# Patient Record
Sex: Female | Born: 1960
Health system: Southern US, Community
[De-identification: ages and names within clinical notes are randomized; demographics above are authoritative.]

## PROBLEM LIST (undated history)

## (undated) DIAGNOSIS — F329 Major depressive disorder, single episode, unspecified: Secondary | ICD-10-CM

## (undated) DIAGNOSIS — M659 Synovitis and tenosynovitis, unspecified: Secondary | ICD-10-CM

## (undated) DIAGNOSIS — F99 Mental disorder, not otherwise specified: Secondary | ICD-10-CM

## (undated) DIAGNOSIS — E785 Hyperlipidemia, unspecified: Secondary | ICD-10-CM

## (undated) DIAGNOSIS — F419 Anxiety disorder, unspecified: Secondary | ICD-10-CM

## (undated) DIAGNOSIS — G4730 Sleep apnea, unspecified: Secondary | ICD-10-CM

## (undated) DIAGNOSIS — K219 Gastro-esophageal reflux disease without esophagitis: Secondary | ICD-10-CM

## (undated) DIAGNOSIS — D649 Anemia, unspecified: Secondary | ICD-10-CM

## (undated) DIAGNOSIS — F32A Depression, unspecified: Secondary | ICD-10-CM

## (undated) DIAGNOSIS — I10 Essential (primary) hypertension: Secondary | ICD-10-CM

## (undated) DIAGNOSIS — M1990 Unspecified osteoarthritis, unspecified site: Secondary | ICD-10-CM

## (undated) DIAGNOSIS — C50919 Malignant neoplasm of unspecified site of unspecified female breast: Secondary | ICD-10-CM

## (undated) DIAGNOSIS — G709 Myoneural disorder, unspecified: Secondary | ICD-10-CM

## (undated) DIAGNOSIS — E119 Type 2 diabetes mellitus without complications: Secondary | ICD-10-CM

## (undated) DIAGNOSIS — M722 Plantar fascial fibromatosis: Secondary | ICD-10-CM

## (undated) HISTORY — DX: Unspecified osteoarthritis, unspecified site: M19.90

## (undated) HISTORY — PX: BREAST RECONSTRUCTION: SHX9

## (undated) HISTORY — DX: Synovitis and tenosynovitis, unspecified: M65.9

## (undated) HISTORY — DX: Anemia, unspecified: D64.9

## (undated) HISTORY — DX: Myoneural disorder, unspecified: G70.9

## (undated) HISTORY — DX: Major depressive disorder, single episode, unspecified: F32.9

## (undated) HISTORY — DX: Plantar fascial fibromatosis: M72.2

## (undated) HISTORY — DX: Sleep apnea, unspecified: G47.30

## (undated) HISTORY — DX: Mental disorder, not otherwise specified: F99

## (undated) HISTORY — DX: Anxiety disorder, unspecified: F41.9

## (undated) HISTORY — DX: Gastro-esophageal reflux disease without esophagitis: K21.9

## (undated) HISTORY — PX: BREAST LUMPECTOMY: SHX2

## (undated) HISTORY — PX: NECK SURGERY: SHX720

## (undated) HISTORY — DX: Type 2 diabetes mellitus without complications: E11.9

## (undated) HISTORY — DX: Depression, unspecified: F32.A

## (undated) HISTORY — PX: REDUCTION MAMMAPLASTY: SUR839

## (undated) HISTORY — PX: ABDOMINAL HYSTERECTOMY: SHX81

## (undated) HISTORY — PX: CHOLECYSTECTOMY: SHX55

## (undated) HISTORY — DX: Hyperlipidemia, unspecified: E78.5

## (undated) HISTORY — PX: KNEE SURGERY: SHX244

---

## 2013-08-24 ENCOUNTER — Encounter: Payer: Self-pay | Admitting: Podiatry

## 2013-08-24 ENCOUNTER — Ambulatory Visit (INDEPENDENT_AMBULATORY_CARE_PROVIDER_SITE_OTHER): Payer: BC Managed Care – PPO | Admitting: Podiatry

## 2013-08-24 VITALS — BP 151/74 | HR 85 | Ht 67.0 in | Wt 235.0 lb

## 2013-08-24 DIAGNOSIS — M2010 Hallux valgus (acquired), unspecified foot: Secondary | ICD-10-CM

## 2013-08-24 DIAGNOSIS — M79606 Pain in leg, unspecified: Secondary | ICD-10-CM

## 2013-08-24 DIAGNOSIS — M21969 Unspecified acquired deformity of unspecified lower leg: Secondary | ICD-10-CM

## 2013-08-24 DIAGNOSIS — M216X9 Other acquired deformities of unspecified foot: Secondary | ICD-10-CM

## 2013-08-24 DIAGNOSIS — M21619 Bunion of unspecified foot: Secondary | ICD-10-CM

## 2013-08-24 DIAGNOSIS — M79609 Pain in unspecified limb: Secondary | ICD-10-CM

## 2013-08-24 NOTE — Progress Notes (Signed)
Subjective: 53 year old female presents complaining of pain in both feet x 3 months. Pain is extremely unbearable, at the level of 10 out of 10 scale, 10 being the worse. This problem has been off and on for several years. She is on feet for entire 8 hours shift at work constantly. She was told that she has arthritis on her feet. Pain is in the morning when she is getting out of bed, after been sitting for a while, and after been on feet for a few hours.   Review of Systems - General ROS: negative for - chills, fatigue, fever, hot flashes, malaise, night sweats or sleep disturbance Ophthalmic ROS: negative ENT ROS: negative Allergy and Immunology ROS: Allergic to fresh cut pine apples. Breast ROS: negative for breast lumps Respiratory ROS: no cough, shortness of breath, or wheezing Cardiovascular ROS: no chest pain or dyspnea on exertion Gastrointestinal ROS: no abdominal pain, change in bowel habits, or black or bloody stools Genito-Urinary ROS: no dysuria, trouble voiding, or hematuria Musculoskeletal ROS: negative Neurological ROS: no TIA or stroke symptoms Dermatological ROS: negative.  Objective: Dermatologic: Normotrophic skin. No open lesions. Vascular: All pedal pulses are palpable bilateral. No edema or erythema noted. Neurologic: All epicritic and tactile sensations grossly intact. Orthopedic: Flat instep, dorsal displacement of the first ray bilateral, forefoot varus bilateral, enlarged bunion bilateral. Tight Achilles tendon with knee flexion and knee extension bilateral. Radiographic examination reveal HAV with bunion, short first ray, lowered medial longitudinal arch height with normal CYMA line bilateral  . 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 Forefoot varus bilateral. Symptomatic Pes Planus bilateral. Pain upon ambulation and weight bearing bilateral.  Plan: Reviewed findings and available options. Advised to stay  off of feet as much as possible. Continue to do exercise for weight contro, best will be stationary bike. Left foot ankle ankle placed in Ankle brace. May benefit from custom orthotics. Eventually may require First MCJ fusion or Cotton type procedure in conjunction with HAV correction and TAL.

## 2013-08-24 NOTE — Patient Instructions (Signed)
Seen for painful feet. Noted of weakened first Metatarsal bone with flattened arch. Ankle brace placed on left foot. Need Orthotics.

## 2013-08-29 ENCOUNTER — Encounter: Payer: Self-pay | Admitting: Podiatry

## 2013-08-29 ENCOUNTER — Ambulatory Visit (INDEPENDENT_AMBULATORY_CARE_PROVIDER_SITE_OTHER): Payer: BC Managed Care – PPO | Admitting: Podiatry

## 2013-08-29 VITALS — BP 142/79 | HR 74

## 2013-08-29 DIAGNOSIS — M21969 Unspecified acquired deformity of unspecified lower leg: Secondary | ICD-10-CM

## 2013-08-29 DIAGNOSIS — M21612 Bunion of left foot: Secondary | ICD-10-CM

## 2013-08-29 DIAGNOSIS — M21619 Bunion of unspecified foot: Secondary | ICD-10-CM

## 2013-08-29 DIAGNOSIS — M21962 Unspecified acquired deformity of left lower leg: Secondary | ICD-10-CM

## 2013-08-29 NOTE — Progress Notes (Signed)
Subjective: Maria Sims is helping but still having excruciating pain in left foot. Last week was able to rest more than usual and was able to get by better.  Need to have custom orthotics, but was not covered by her insurance. She already has a pair of OTC orthotics and are not comfortable.   Objective: Dermatologic: Normotrophic skin. No open lesions. Vascular: All pedal pulses are palpable bilateral. No edema or erythema noted.  Neurologic: All epicritic and tactile sensations grossly intact. Orthopedic: Flat instep, dorsal displacement of the first ray bilateral, forefoot varus bilateral, enlarged bunion bilateral.  Tight Achilles tendon with knee flexion and knee extension bilateral.  Radiographic findings reviewed again: HAV with bunion, short first ray, lowered medial longitudinal arch height with normal CYMA line bilateral.  Assessment: Ankle Equinus bilateral.  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  Forefoot varus bilateral.  Symptomatic Pes Planus bilateral.  Pain upon ambulation and weight bearing bilateral.   Plan: Reviewed findings and available options.  Patient request surgical options on left foot. Surgery consent form reviewed for Cotton osteotomy with bone graft and McBride bunionectomy left.  If condition does not improve with the above procedure, may require further intervention with Lapidus, STJ implant, TAL procedures.

## 2013-08-29 NOTE — Patient Instructions (Signed)
Surgery consent form reviewed for Cotton osteotomy with bone graft and McBride bunionectomy left foot. All questions answered. Please contact us should you have further questions regard to scheduled surgery.

## 2013-09-06 ENCOUNTER — Telehealth: Payer: Self-pay | Admitting: *Deleted

## 2013-09-06 NOTE — Telephone Encounter (Signed)
Dr. Caffie Pinto , Patient called today. She wanted me to tell you as soon as her Disability comes in effect... Possibly in December she wants to reschedule her surgery. She ask in the meantime is there anything you can give her to take the edge off of her pain.

## 2013-09-07 MED ORDER — NABUMETONE 500 MG PO TABS: 500.0000 mg | ORAL_TABLET | Freq: Two times a day (BID) | ORAL | Status: DC

## 2013-09-27 ENCOUNTER — Encounter: Payer: BC Managed Care – PPO | Admitting: Podiatry

## 2013-09-28 ENCOUNTER — Ambulatory Visit (INDEPENDENT_AMBULATORY_CARE_PROVIDER_SITE_OTHER): Payer: BC Managed Care – PPO | Admitting: Podiatry

## 2013-09-28 ENCOUNTER — Encounter: Payer: Self-pay | Admitting: Podiatry

## 2013-09-28 VITALS — BP 144/86 | HR 66

## 2013-09-28 DIAGNOSIS — M79609 Pain in unspecified limb: Secondary | ICD-10-CM

## 2013-09-28 DIAGNOSIS — M659 Synovitis and tenosynovitis, unspecified: Secondary | ICD-10-CM

## 2013-09-28 DIAGNOSIS — M21962 Unspecified acquired deformity of left lower leg: Secondary | ICD-10-CM

## 2013-09-28 DIAGNOSIS — M21969 Unspecified acquired deformity of unspecified lower leg: Secondary | ICD-10-CM

## 2013-09-28 DIAGNOSIS — M65979 Unspecified synovitis and tenosynovitis, unspecified ankle and foot: Secondary | ICD-10-CM

## 2013-09-28 DIAGNOSIS — M79605 Pain in left leg: Secondary | ICD-10-CM

## 2013-09-28 HISTORY — DX: Synovitis and tenosynovitis, unspecified: M65.9

## 2013-09-28 HISTORY — DX: Unspecified synovitis and tenosynovitis, unspecified ankle and foot: M65.979

## 2013-09-28 NOTE — Patient Instructions (Signed)
Left foot and ankle pain continues. Air CAM walker dispensed. Return as needed.

## 2013-09-28 NOTE — Progress Notes (Signed)
Subjective:  53 year old presents complaining of pain in left lower limb.  Wearing ankle brace, which is helping but still having excruciating pain in left foot and ankle after prolonged standing and walking.  Patient was not able to get orthotics. Patient wants to have surgical correction of the left foot but can not afford to be out of work at this time.  She is on her foot 8 full hours while at work.   Objective: No new changes in findings.  Dermatologic: Normotrophic skin. No open lesions. Vascular: All pedal pulses are palpable bilateral. No edema or erythema noted.  Neurologic: All epicritic and tactile sensations grossly intact. Orthopedic: Flat instep, dorsal displacement of the first ray bilateral, forefoot varus bilateral, enlarged bunion bilateral.  Tight Achilles tendon with knee flexion and knee extension bilateral.  Pain at left rearfoot and ankle joint.   Assessment: Ankle Equinus bilateral.  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  Forefoot varus bilateral.  Symptomatic Pes Planus bilateral.  Pain upon ambulation and weight bearing bilateral.   Plan: Reviewed findings and available options.  Left lower limb placed in Pneumatic CAM walker.

## 2013-11-01 ENCOUNTER — Other Ambulatory Visit: Payer: Self-pay | Admitting: Podiatry

## 2013-11-01 ENCOUNTER — Ambulatory Visit (INDEPENDENT_AMBULATORY_CARE_PROVIDER_SITE_OTHER): Payer: BC Managed Care – PPO | Admitting: Podiatry

## 2013-11-01 ENCOUNTER — Encounter: Payer: Self-pay | Admitting: Podiatry

## 2013-11-01 VITALS — BP 140/82 | HR 70

## 2013-11-01 DIAGNOSIS — M79602 Pain in left arm: Secondary | ICD-10-CM

## 2013-11-01 DIAGNOSIS — M79605 Pain in left leg: Secondary | ICD-10-CM

## 2013-11-01 NOTE — Patient Instructions (Signed)
Seen for pain in left foot. Continue to use ankle brace and CAM walker for now.

## 2013-11-01 NOTE — Progress Notes (Signed)
Subjective:  53 year old presents complaining of pain in left lower limb.  Wearing ankle brace at work and wears CAM walker when she is out of work.  Stated that her foot pain is too much to endure everyday and wants to have surgical intervention.   Objective:  No new changes in findings.  Dermatologic: Normotrophic skin. No open lesions. Vascular: All pedal pulses are palpable bilateral. No edema or erythema noted.  Neurologic: All epicritic and tactile sensations grossly intact. Orthopedic: Flat instep, dorsal displacement of the first ray bilateral, forefoot varus bilateral, enlarged bunion bilateral.  Tight Achilles tendon with knee flexion and knee extension bilateral.  Pain at left rearfoot and ankle joint.   Assessment: Ankle Equinus bilateral.  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  Forefoot varus bilateral.  Symptomatic Pes Planus bilateral.  Pain upon ambulation and weight bearing bilateral.   Plan: Reviewed findings and available options.  Continue with ankle brace and Pneumatic CAM walker as needed. Patient will postpone the surgery till her personal finances are in order.

## 2014-01-10 ENCOUNTER — Encounter: Payer: Self-pay | Admitting: Podiatry

## 2014-01-10 ENCOUNTER — Ambulatory Visit (INDEPENDENT_AMBULATORY_CARE_PROVIDER_SITE_OTHER): Payer: BC Managed Care – PPO | Admitting: Podiatry

## 2014-01-10 VITALS — BP 147/94 | HR 74

## 2014-01-10 DIAGNOSIS — M21612 Bunion of left foot: Secondary | ICD-10-CM

## 2014-01-10 DIAGNOSIS — M2012 Hallux valgus (acquired), left foot: Secondary | ICD-10-CM

## 2014-01-10 DIAGNOSIS — M21962 Unspecified acquired deformity of left lower leg: Secondary | ICD-10-CM

## 2014-01-10 NOTE — Patient Instructions (Signed)
Surgery consent form reviewed for Cotton osteotomy with bone graft and McBride bunionectomy left foot.

## 2014-01-10 NOTE — Progress Notes (Signed)
Subjective:  53 year old presents complaining of pain in left lower limb.  Stated that her foot pain is too much to endure everyday and wants to have surgical intervention.   Objective:  Dermatologic: Normotrophic skin. No open lesions. Vascular: All pedal pulses are palpable bilateral. No edema or erythema noted.  Neurologic: All epicritic and tactile sensations grossly intact. Orthopedic: Flat instep, dorsal displacement of the first ray bilateral, forefoot varus bilateral, enlarged bunion bilateral.  Tight Achilles tendon with knee flexion and knee extension bilateral.  Pain at left rearfoot and ankle joint.   Assessment: Ankle Equinus bilateral.  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  Forefoot varus bilateral.  Symptomatic Pes Planus bilateral.  Pain upon ambulation and weight bearing bilateral.   Plan: Reviewed findings and available options.  Continue with ankle brace and Pneumatic CAM walker as needed. Surgery consent form reviewed for Cotton osteotomy with bone graft and McBride bunionectomy left foot. Explained possible fusion of the first MCJ in future if pain continues after this bone graft procedure.

## 2014-01-26 DIAGNOSIS — M2010 Hallux valgus (acquired), unspecified foot: Secondary | ICD-10-CM

## 2014-01-26 DIAGNOSIS — M216X9 Other acquired deformities of unspecified foot: Secondary | ICD-10-CM

## 2014-01-26 HISTORY — PX: OTHER SURGICAL HISTORY: SHX169

## 2014-01-26 HISTORY — PX: BUNIONECTOMY: SHX129

## 2014-01-31 ENCOUNTER — Encounter: Payer: Self-pay | Admitting: Podiatry

## 2014-01-31 ENCOUNTER — Ambulatory Visit (INDEPENDENT_AMBULATORY_CARE_PROVIDER_SITE_OTHER): Payer: BLUE CROSS/BLUE SHIELD | Admitting: Podiatry

## 2014-01-31 VITALS — BP 140/90 | HR 85

## 2014-01-31 DIAGNOSIS — M21969 Unspecified acquired deformity of unspecified lower leg: Secondary | ICD-10-CM

## 2014-01-31 NOTE — Patient Instructions (Signed)
Doing well with cast following left foot surgery. Walking in cast without aid.  Use cane or crutches if needed for balance.  Return in 3 weeks.

## 2014-01-31 NOTE — Progress Notes (Signed)
1 week following left foot Cotton osteotomy with bone graft. Doing well with cast.  Denies any discomfort. Normal color in all digits. No sign of abnormal swelling or pain.  Continue with minimum ambulation and use crutches or cane as needed.

## 2014-02-06 ENCOUNTER — Telehealth: Payer: Self-pay | Admitting: *Deleted

## 2014-02-06 NOTE — Telephone Encounter (Signed)
02/04/2014 Dr. Caffie Pinto, Patient called on Sat and said she needs another RX for Oxycodone because she dropped most of it in the commode, she was opening bottle and it fell in the toilet. She said she has 5 left.

## 2014-02-13 ENCOUNTER — Telehealth: Payer: Self-pay | Admitting: *Deleted

## 2014-02-13 MED ORDER — OXYCODONE-ACETAMINOPHEN 7.5-325 MG PO TABS: 1.0000 | ORAL_TABLET | Freq: Four times a day (QID) | ORAL | Status: DC | PRN

## 2014-02-13 NOTE — Telephone Encounter (Signed)
02/13/2014  Dr. Caffie Pinto, Patient came in this afternoon and requested a rx for pain and discomfort.

## 2014-02-21 ENCOUNTER — Ambulatory Visit (INDEPENDENT_AMBULATORY_CARE_PROVIDER_SITE_OTHER): Payer: BLUE CROSS/BLUE SHIELD | Admitting: Podiatry

## 2014-02-21 ENCOUNTER — Encounter: Payer: Self-pay | Admitting: Podiatry

## 2014-02-21 VITALS — BP 135/87 | HR 73

## 2014-02-21 DIAGNOSIS — M79605 Pain in left leg: Secondary | ICD-10-CM

## 2014-02-21 DIAGNOSIS — M21962 Unspecified acquired deformity of left lower leg: Secondary | ICD-10-CM

## 2014-02-21 NOTE — Patient Instructions (Signed)
Seen for 4 weeks post op. Doing well. No abnormal findings. Cast replaced with Fiberglass below knee cast.  Continue with semi weight bearing. Return in 3 weeks.

## 2014-02-21 NOTE — Progress Notes (Signed)
4 weeks post op, left foot Cotton osteotomy with bone graft. Original cast is clean and well maintained other than worn out forefoot big toe area.  Stated that she experience occasional twinge of pain. No other problems. Post op X-rays taken, findings consistent with surgery performed.

## 2014-03-14 ENCOUNTER — Ambulatory Visit (INDEPENDENT_AMBULATORY_CARE_PROVIDER_SITE_OTHER): Payer: BLUE CROSS/BLUE SHIELD | Admitting: Podiatry

## 2014-03-14 ENCOUNTER — Encounter: Payer: Self-pay | Admitting: Podiatry

## 2014-03-14 DIAGNOSIS — M21962 Unspecified acquired deformity of left lower leg: Secondary | ICD-10-CM

## 2014-03-14 MED ORDER — OXYCODONE-ACETAMINOPHEN 7.5-325 MG PO TABS: 1.0000 | ORAL_TABLET | Freq: Four times a day (QID) | ORAL | Status: DC | PRN

## 2014-03-14 NOTE — Patient Instructions (Signed)
6 weeks post op wound doing well. Cast removed. Stay in CAM walker for ambulation. Return in 3 weeks.

## 2014-03-14 NOTE — Progress Notes (Signed)
6 weeks post op following Cotton osteotomy with bone graft left foot. Denies any discomfort other than needing a pain pill at night in order to sleep. Cast removed. Surgical wound well healed. Patient is to wear CAM walker to ambulate. She is able to walk in cast boot without pain or discomfort. Return in 3 weeks.

## 2014-03-23 ENCOUNTER — Telehealth: Payer: Self-pay | Admitting: *Deleted

## 2014-03-23 NOTE — Telephone Encounter (Signed)
Dr. Caffie Pinto, Maria Sims left a message today 03/23/14 and ask could you write a letter saying why she had to have this surgery, She need it to send to Disability([employer]) and she can pick it up on Monday.

## 2014-03-28 ENCOUNTER — Encounter: Payer: Self-pay | Admitting: Podiatry

## 2014-03-28 ENCOUNTER — Ambulatory Visit (INDEPENDENT_AMBULATORY_CARE_PROVIDER_SITE_OTHER): Payer: BLUE CROSS/BLUE SHIELD | Admitting: Podiatry

## 2014-03-28 DIAGNOSIS — Z9889 Other specified postprocedural states: Secondary | ICD-10-CM

## 2014-03-28 NOTE — Progress Notes (Signed)
Subjective: 8 weeks post op following Cotton osteotomy with bone graft left foot. Stated that she is still using cast boot.  She is able to walk in cast boot without pain or discomfort as long as she is not on her feet too long. Return in 3 weeks.

## 2014-03-28 NOTE — Patient Instructions (Signed)
Doing well wearing cast boot but cannot wear regular shoe due to swelling. Continue to try regular shoe in the morning with Compression bandage on. Return in 3 weeks.

## 2014-04-04 ENCOUNTER — Encounter: Payer: BLUE CROSS/BLUE SHIELD | Admitting: Podiatry

## 2014-04-12 ENCOUNTER — Ambulatory Visit (INDEPENDENT_AMBULATORY_CARE_PROVIDER_SITE_OTHER): Payer: BLUE CROSS/BLUE SHIELD | Admitting: Podiatry

## 2014-04-12 ENCOUNTER — Encounter: Payer: Self-pay | Admitting: Podiatry

## 2014-04-12 DIAGNOSIS — Z9889 Other specified postprocedural states: Secondary | ICD-10-CM

## 2014-04-12 NOTE — Progress Notes (Signed)
Subjective: This is 11 weeks post op left foot surgery.  Stated that she is still having pain and swelling on left lower limb after been up and around. The pain and swelling goes down once she sit and rest.  Stated that maximum she can stand on her feet is about 2 hours at the most before the foot swells and aches in ankle area.   Assessment: Post op pain and swelling on left lower limb with extended weight bearing.   Plan:  Continue with daily walking exercise.  Return in one month for evaluation.

## 2014-04-12 NOTE — Patient Instructions (Signed)
11 weeks post op left foot.  Still pain and swelling after been up and around.  Continue with daily walking exercise.

## 2014-04-18 ENCOUNTER — Encounter: Payer: BLUE CROSS/BLUE SHIELD | Admitting: Podiatry

## 2014-04-28 DIAGNOSIS — M79673 Pain in unspecified foot: Secondary | ICD-10-CM

## 2014-05-12 ENCOUNTER — Encounter: Payer: Self-pay | Admitting: Podiatry

## 2014-05-12 ENCOUNTER — Ambulatory Visit (INDEPENDENT_AMBULATORY_CARE_PROVIDER_SITE_OTHER): Payer: BLUE CROSS/BLUE SHIELD | Admitting: Podiatry

## 2014-05-12 VITALS — BP 153/89 | HR 80

## 2014-05-12 DIAGNOSIS — Z9889 Other specified postprocedural states: Secondary | ICD-10-CM

## 2014-05-12 NOTE — Progress Notes (Signed)
Subjective: This is 15 weeks post op on left foot surgery. She is walking in regular shoes. Stated that last night her left ankle got swollen. Also has occasional sharp pain over bunion surgery site and sometimes at mid foot area.  Usually able to walk with compression stockinet that helps with swelling.   Objective: Positive of mild foot and ankle edema left. No other changes noted.  Able to ambulate short period without pain.  Assessment: Post op pain and swelling on left foot not affecting light ambulation.  Plan: Continue with compression stockinet.  May benefit from ankle support if indicated. Continue increase ambulation as tolerated.  Return as needed.

## 2014-05-12 NOTE — Patient Instructions (Addendum)
Still having occasional pain with swelling in left foot and ankle.  Continue with compression stockinet and use ankle support as needed.  May benefit from custom/over the counter orthotics. Return as needed.

## 2014-05-29 DIAGNOSIS — G8929 Other chronic pain: Secondary | ICD-10-CM | POA: Insufficient documentation

## 2014-05-29 DIAGNOSIS — M542 Cervicalgia: Secondary | ICD-10-CM

## 2014-06-08 DIAGNOSIS — F331 Major depressive disorder, recurrent, moderate: Secondary | ICD-10-CM | POA: Insufficient documentation

## 2014-08-08 ENCOUNTER — Ambulatory Visit (INDEPENDENT_AMBULATORY_CARE_PROVIDER_SITE_OTHER): Payer: BLUE CROSS/BLUE SHIELD | Admitting: Podiatry

## 2014-08-08 ENCOUNTER — Encounter: Payer: Self-pay | Admitting: Podiatry

## 2014-08-08 DIAGNOSIS — M21962 Unspecified acquired deformity of left lower leg: Secondary | ICD-10-CM

## 2014-08-08 MED ORDER — OXYCODONE-ACETAMINOPHEN 7.5-325 MG PO TABS: 1.0000 | ORAL_TABLET | Freq: Four times a day (QID) | ORAL | Status: DC | PRN

## 2014-08-08 NOTE — Patient Instructions (Signed)
Seen for follow up on left foot pain. Now suffering from Sciatica as well. Ambulate and increase activity as tolerated.

## 2014-08-08 NOTE — Progress Notes (Signed)
Subjective: Stated that her foot swells off and on. Also just found out she has sciatic nerve problem. Pain comes from back down to the toes.  Left foot pain is on medial aspect of the bunion site. From the surgical site is minimum.  While she is out she has to use cane to maintain her balance.  Been on feet more than usual for the past few weeks chasing after her 53 year old grand daughter at home.   Patient filed disability paper at [facility] department.  Patient is having difficulty walking up or down stairs. It increases pain on her knees.  Pain in right foot in the morning as she getting out of bed. Pain ease off after been on for a while.  Objective: Enlarged scar over left bunion site with pain. Lower limb edema with prolonged standing left foot and leg. Nerve pain starting from lower back down to foot toe area.   Assessment: Lower limb arthritis. Sciatica left side. Pain and swelling on left lower limb with prolonged standing or ambulation.   Plan: Reviewed findings. Continue current level of care.  Stay off of feet as needed. Ambulate as tolerated. Return as needed.

## 2014-08-18 DIAGNOSIS — M79673 Pain in unspecified foot: Secondary | ICD-10-CM

## 2014-09-04 ENCOUNTER — Encounter (HOSPITAL_BASED_OUTPATIENT_CLINIC_OR_DEPARTMENT_OTHER): Payer: Self-pay

## 2014-09-04 ENCOUNTER — Emergency Department (HOSPITAL_BASED_OUTPATIENT_CLINIC_OR_DEPARTMENT_OTHER)
Admission: EM | Admit: 2014-09-04 | Discharge: 2014-09-04 | Disposition: A | Discharge status: Home / Self Care | Payer: Self-pay | Attending: Emergency Medicine | Admitting: Emergency Medicine

## 2014-09-04 DIAGNOSIS — G8929 Other chronic pain: Secondary | ICD-10-CM | POA: Insufficient documentation

## 2014-09-04 DIAGNOSIS — M542 Cervicalgia: Secondary | ICD-10-CM | POA: Insufficient documentation

## 2014-09-04 DIAGNOSIS — Z853 Personal history of malignant neoplasm of breast: Secondary | ICD-10-CM | POA: Insufficient documentation

## 2014-09-04 DIAGNOSIS — M549 Dorsalgia, unspecified: Secondary | ICD-10-CM | POA: Insufficient documentation

## 2014-09-04 DIAGNOSIS — Z79899 Other long term (current) drug therapy: Secondary | ICD-10-CM | POA: Insufficient documentation

## 2014-09-04 DIAGNOSIS — Z791 Long term (current) use of non-steroidal anti-inflammatories (NSAID): Secondary | ICD-10-CM | POA: Insufficient documentation

## 2014-09-04 DIAGNOSIS — M25562 Pain in left knee: Secondary | ICD-10-CM | POA: Insufficient documentation

## 2014-09-04 DIAGNOSIS — I10 Essential (primary) hypertension: Secondary | ICD-10-CM | POA: Insufficient documentation

## 2014-09-04 HISTORY — DX: Essential (primary) hypertension: I10

## 2014-09-04 HISTORY — DX: Malignant neoplasm of unspecified site of unspecified female breast: C50.919

## 2014-09-04 MED ORDER — PREDNISONE 20 MG PO TABS: 40.0000 mg | ORAL_TABLET | Freq: Every day | ORAL | Status: DC

## 2014-09-04 NOTE — ED Provider Notes (Signed)
CSN: [id number]     Arrival date & time 09/04/14  1412 History  This chart was scribed for Maria Belling, MD by Helane Gunther, ED Scribe. This patient was seen in room MH09/MH09 and the patient's care was started at 3:15 PM.    Chief Complaint  Patient presents with  . Neck Pain   The history is provided by the patient. No language interpreter was used.   HPI Comments: Windy Dudek is a 54 y.o. female with a PMHx of HTN and a PSHx of neck surgery (2x herniated disk repair) who presents to the Emergency Department complaining of constant posterior neck pain radiating to the shoulders and arms, all the way down to the hands, onset a few months ago. She also c/o bilateral knee pain, onset at the same time. 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 She reports associated numbness to the hands. She has been diagnosed with sciatica on the left side. She has been taking oxycodone and ibuprofen with mild relief. She is on BP medication, and was on gabapentin for a time. She denies having similar pains before starting this work. She has since lost her job and with it her insurance.  Past Medical History  Diagnosis Date  . Tenosynovitis of foot and ankle 09/28/2013  . Hypertension   . Breast cancer    Past Surgical History  Procedure Laterality Date  . Bunionectomy Left 01/26/2014    @ [facility]  . Cotton osteotomy w/ bone graft Left 01/26/2014    @ [place]  . Neck surgery    . Cholecystectomy    . Breast lumpectomy    . Breast reconstruction    . Knee surgery    . Abdominal hysterectomy     No family history on file. Social History  Substance Use Topics  . Smoking status: Never Smoker   . Smokeless tobacco: Never Used  . Alcohol Use: No   OB History    No data available     Review of Systems  Musculoskeletal: Positive for myalgias, back pain, arthralgias and neck pain.  Neurological:  Positive for numbness.  All other systems reviewed and are negative.   Allergies  Pineapple  Home Medications   Prior to Admission medications   Medication Sig Start Date End Date Taking? Authorizing Provider  gabapentin (NEURONTIN) 100 MG capsule Take 100 mg by mouth daily.    Historical Provider, MD  HYDROcodone-acetaminophen (NORCO/VICODIN) 5-325 MG per tablet Take 1 tablet by mouth every 6 (six) hours as needed for moderate pain.    Historical Provider, MD  meloxicam (MOBIC) 15 MG tablet Take 15 mg by mouth daily.    Historical Provider, MD  methocarbamol (ROBAXIN) 500 MG tablet Take 500 mg by mouth daily.    Historical Provider, MD  nabumetone (RELAFEN) 500 MG tablet Take 1 tablet (500 mg total) by mouth 2 (two) times daily. 09/07/13   Myeong O Sheard, DPM  oxyCODONE-acetaminophen (PERCOCET) 7.5-325 MG per tablet Take 1 tablet by mouth every 6 (six) hours as needed. 08/08/14   Myeong O Sheard, DPM  predniSONE (DELTASONE) 20 MG tablet Take 2 tablets (40 mg total) by mouth daily. 09/04/14   Maria Belling, MD  valsartan-hydrochlorothiazide (DIOVAN-HCT) 80-12.5 MG per tablet Take 1 tablet by mouth daily.  06/02/13   Historical Provider, MD  ZOLPIDEM TARTRATE PO Take 5 mg by mouth as needed.    Historical  Provider, MD   BP 140/90 mmHg  Pulse 74  Temp(Src) 98.4 F (36.9 C) (Oral)  Resp 18  Ht 5\' 7"  (1.702 m)  Wt 230 lb (104.327 kg)  BMI 36.01 kg/m2  SpO2 99% Physical Exam  Constitutional: She appears well-developed and well-nourished.  HENT:  Head: Normocephalic and atraumatic.  Eyes: Conjunctivae are normal. Right eye exhibits no discharge. Left eye exhibits no discharge.  Pulmonary/Chest: Effort normal. No respiratory distress.  Musculoskeletal: She exhibits tenderness.  Midline cervical TTP. Good grip strength bilaterally. Somewhat painful gait, more so in the L knee. No localized numbness or weakness.  Neurological: She is alert. Coordination normal.  Skin: Skin is warm and  dry. No rash noted. She is not diaphoretic. No erythema.  Psychiatric: She has a normal mood and affect.  Nursing note and vitals reviewed.   ED Course  Procedures  DIAGNOSTIC STUDIES: Oxygen Saturation is 99% on RA, normal by my interpretation.    COORDINATION OF CARE: 3:28 PM - Discussed plans to refer to an orthopedist or sports medicine specialist, and ordering a 4-day course of steroids. Pt advised of plan for treatment and pt agrees.  Labs Review Labs Reviewed - No data to display  Imaging Review No results found. I have personally reviewed and evaluated these images and lab results as part of my medical decision-making.   EKG Interpretation None      MDM   Final diagnoses:  Chronic neck and back pain    Patient with chronic neck and back pain. No red flags. Will have patient follow-up as needed with sports medicine. Given short course of steroids    I personally performed the services described in this documentation, which was scribed in my presence. The recorded information has been reviewed and is accurate.    Maria Belling, MD 09/04/14 [phone]

## 2014-09-04 NOTE — ED Notes (Signed)
C/o posterior neck pain "that travels down to my legs and feet for months"-denies injury-pt NAD with steady gait

## 2014-09-04 NOTE — Discharge Instructions (Signed)

## 2014-09-06 ENCOUNTER — Ambulatory Visit: Payer: Self-pay | Admitting: Family Medicine

## 2014-09-11 ENCOUNTER — Encounter: Payer: Self-pay | Admitting: Family Medicine

## 2014-09-11 ENCOUNTER — Ambulatory Visit (INDEPENDENT_AMBULATORY_CARE_PROVIDER_SITE_OTHER): Payer: Self-pay | Admitting: Family Medicine

## 2014-09-11 VITALS — BP 141/91 | HR 72 | Ht 67.0 in | Wt 230.0 lb

## 2014-09-11 DIAGNOSIS — M25562 Pain in left knee: Secondary | ICD-10-CM

## 2014-09-11 DIAGNOSIS — M25561 Pain in right knee: Secondary | ICD-10-CM

## 2014-09-11 MED ORDER — DICLOFENAC SODIUM 75 MG PO TBEC: 75.0000 mg | DELAYED_RELEASE_TABLET | Freq: Two times a day (BID) | ORAL | Status: DC

## 2014-09-11 NOTE — Patient Instructions (Signed)
Take tylenol 500mg  1-2 tabs three times a day for pain. Try diclofenac twice a day with food INSTEAD of the nabumetone for at least 2-3 weeks and see if you feel any better with this one. Glucosamine sulfate 750mg  twice a day is a supplement that may help. Capsaicin, aspercreme, or biofreeze topically up to four times a day may also help with pain. Cortisone injections are an option - call if you want to try one of these. If cortisone injections do not help, there are different types of shots that may help but they take longer to take effect. It's important that you continue to stay active. Straight leg raises, knee extensions 3 sets of 10 once a day (add ankle weight if these become too easy). Consider physical therapy to strengthen muscles around the joint that hurts to take pressure off of the joint itself. Shoe inserts with good arch support may be helpful. Walker or cane if needed. Heat or ice 15 minutes at a time 3-4 times a day as needed to help with pain. Water aerobics and cycling with low resistance are the best two types of exercise for arthritis. Call me when the cone coverage goes through Otherwise follow up with me in 1 month to 6 weeks.

## 2014-09-13 DIAGNOSIS — M25561 Pain in right knee: Secondary | ICD-10-CM | POA: Insufficient documentation

## 2014-09-13 DIAGNOSIS — M25562 Pain in left knee: Secondary | ICD-10-CM

## 2014-09-13 NOTE — Progress Notes (Signed)
PCP: No primary care provider on file.  Subjective:   HPI: Patient is a 54 y.o. female here for bilateral knee pain.  Patient reports she's had 3 years of bilateral knee pain. Started mainly after working on concrete floors standing for 7 1/2 hours a day 7 days a week. No acute injury or trauma. Difficulty bending, kneeling, getting up after prolonged sitting. Difficulty walking for exercise. Some catching. Remote history of partial meniscectomy of right knee. No locking, giving out.  Past Medical History  Diagnosis Date  . Tenosynovitis of foot and ankle 09/28/2013  . Hypertension   . Breast cancer     Current Outpatient Prescriptions on File Prior to Visit  Medication Sig Dispense Refill  . gabapentin (NEURONTIN) 100 MG capsule Take 100 mg by mouth daily.    . valsartan-hydrochlorothiazide (DIOVAN-HCT) 80-12.5 MG per tablet Take 1 tablet by mouth daily.      No current facility-administered medications on file prior to visit.    Past Surgical History  Procedure Laterality Date  . Bunionectomy Left 01/26/2014    @ [facility]  . Cotton osteotomy w/ bone graft Left 01/26/2014    @ [place]  . Neck surgery    . Cholecystectomy    . Breast lumpectomy    . Breast reconstruction    . Knee surgery    . Abdominal hysterectomy      Allergies  Allergen Reactions  . Pineapple     Social History   Social History  . Marital Status: Single    Spouse Name: N/A  . Number of Children: N/A  . Years of Education: N/A   Occupational History  . Not on file.   Social History Main Topics  . Smoking status: Never Smoker   . Smokeless tobacco: Never Used  . Alcohol Use: No  . Drug Use: No  . Sexual Activity: Not on file   Other Topics Concern  . Not on file   Social History Narrative    No family history on file.  BP 141/91 mmHg  Pulse 72  Ht 5\' 7"  (1.702 m)  Wt 230 lb (104.327 kg)  BMI 36.01 kg/m2  Review of Systems: See HPI above.    Objective:   Physical Exam:  Gen: NAD  Bilateral knees: No gross deformity, ecchymoses, effusion. TTP mainly medial joint lines. FROM. Negative ant/post drawers. Negative valgus/varus testing. Negative lachmanns. Negative mcmurrays, apleys, patellar apprehension. NV intact distally.    Assessment & Plan:  1. Bilateral knee pain - consistent with DJD.  Try diclofenac twice a day with food.  Discussed tyelnol, glucosamine, topical medications.  Given information to get cone coverage and would consider radiographs.  She will consider injections.  Shown home exercises to do daily.  Heat/ice.  F/u in 1 month to 6 weeks.

## 2014-09-13 NOTE — Assessment & Plan Note (Signed)
consistent with DJD.  Try diclofenac twice a day with food.  Discussed tyelnol, glucosamine, topical medications.  Given information to get cone coverage and would consider radiographs.  She will consider injections.  Shown home exercises to do daily.  Heat/ice.  F/u in 1 month to 6 weeks.

## 2014-09-14 DIAGNOSIS — M533 Sacrococcygeal disorders, not elsewhere classified: Secondary | ICD-10-CM | POA: Insufficient documentation

## 2014-09-22 ENCOUNTER — Ambulatory Visit: Payer: Self-pay

## 2014-09-27 ENCOUNTER — Ambulatory Visit: Payer: Self-pay

## 2014-10-10 ENCOUNTER — Encounter: Payer: Self-pay | Admitting: Internal Medicine

## 2014-10-10 ENCOUNTER — Ambulatory Visit (INDEPENDENT_AMBULATORY_CARE_PROVIDER_SITE_OTHER): Payer: No Typology Code available for payment source | Admitting: Internal Medicine

## 2014-10-10 VITALS — BP 145/98 | HR 70 | Temp 98.4°F | Ht 67.0 in | Wt 277.8 lb

## 2014-10-10 DIAGNOSIS — Z1211 Encounter for screening for malignant neoplasm of colon: Secondary | ICD-10-CM | POA: Insufficient documentation

## 2014-10-10 DIAGNOSIS — R7303 Prediabetes: Secondary | ICD-10-CM | POA: Insufficient documentation

## 2014-10-10 DIAGNOSIS — Z0000 Encounter for general adult medical examination without abnormal findings: Secondary | ICD-10-CM

## 2014-10-10 DIAGNOSIS — I10 Essential (primary) hypertension: Secondary | ICD-10-CM

## 2014-10-10 DIAGNOSIS — R7309 Other abnormal glucose: Secondary | ICD-10-CM

## 2014-10-10 DIAGNOSIS — F331 Major depressive disorder, recurrent, moderate: Secondary | ICD-10-CM

## 2014-10-10 LAB — GLUCOSE, CAPILLARY: GLUCOSE-CAPILLARY: 100 mg/dL — AB (ref 65–99)

## 2014-10-10 LAB — POCT GLYCOSYLATED HEMOGLOBIN (HGB A1C): HEMOGLOBIN A1C: 6.4

## 2014-10-10 MED ORDER — ZOLPIDEM TARTRATE 5 MG PO TABS: 5.0000 mg | ORAL_TABLET | Freq: Every evening | ORAL | Status: DC | PRN

## 2014-10-10 MED ORDER — ESCITALOPRAM OXALATE 20 MG PO TABS: 20.0000 mg | ORAL_TABLET | Freq: Every day | ORAL | Status: DC

## 2014-10-10 NOTE — Assessment & Plan Note (Signed)
BP Readings from Last 3 Encounters:  10/10/14 145/98  09/11/14 141/91  09/04/14 140/90    No results found for: NA, K, CREATININE  Assessment: Blood pressure control:  Just above goal Progress toward BP goal:   Just above goal Comments: compliant with valsartan-HCTZ 80-12.5 mg daily.   Plan: Medications:  continue current medications, for now.  Educational resources provided:   Self management tools provided:   Other plans: Once patient finishes prescription, I would favor switching to Lisinopril-HCTZ 20-12.5 mg daily from [facility].

## 2014-10-10 NOTE — Assessment & Plan Note (Signed)
HgbA1c 6.4%. Patient counseled on diet and exercise. Will need to recheck HgbA1c at follow up visit.

## 2014-10-10 NOTE — Progress Notes (Signed)
Subjective:    Patient ID: Maria Sims, female    DOB: 1960-06-13, 54 y.o.   MRN: [id number]  HPI [name] is a 54 y.o. female with PMHx of HTN and  who presents to the clinic for establishing care and follow up on HTN. Please see A&P for the status of the patient's chronic medical problems.   Past Medical History  Diagnosis Date  . Tenosynovitis of foot and ankle 09/28/2013  . Hypertension   . Breast cancer     Outpatient Encounter Prescriptions as of 10/10/2014  Medication Sig Note  . escitalopram (LEXAPRO) 20 MG tablet Take 1 tablet (20 mg total) by mouth daily.   . valsartan-hydrochlorothiazide (DIOVAN-HCT) 80-12.5 MG per tablet Take 1 tablet by mouth daily.  08/29/2013: Received from: External Pharmacy  . zolpidem (AMBIEN) 5 MG tablet Take 1 tablet (5 mg total) by mouth at bedtime as needed for sleep.   . [DISCONTINUED] diclofenac (VOLTAREN) 75 MG EC tablet Take 1 tablet (75 mg total) by mouth 2 (two) times daily.   . [DISCONTINUED] escitalopram (LEXAPRO) 20 MG tablet Take 20 mg by mouth. 09/13/2014: Received from: [facility]  . [DISCONTINUED] gabapentin (NEURONTIN) 100 MG capsule Take 100 mg by mouth daily.   . [DISCONTINUED] predniSONE (STERAPRED UNI-PAK 21 TAB) 10 MG (21) TBPK tablet TAPER DAILY 09/13/2014: Received from: External Pharmacy  . [DISCONTINUED] zolpidem (AMBIEN) 5 MG tablet Take 5 mg by mouth. 09/13/2014: Received from: [facility]   No facility-administered encounter medications on file as of 10/10/2014.    No family history on file.  Social History   Social History  . Marital Status: Single    Spouse Name: N/A  . Number of Children: N/A  . Years of Education: N/A   Occupational History  . Not on file.   Social History Main Topics  . Smoking status: Never Smoker   . Smokeless tobacco: Never Used  . Alcohol Use: No  . Drug Use: No  . Sexual Activity: Not on file   Other Topics Concern  . Not on file   Social History Narrative    Review of Systems General: Denies fever, chills, fatigue Respiratory: Denies SOB, cough, DOE  Cardiovascular: Denies chest pain and palpitations.  Gastrointestinal: Denies abdominal pain, diarrhea, constipation, blood in stool  Endocrine: Denies hot or cold intolerance, polyuria, and polydipsia. Musculoskeletal: Admits to chronic back pain, chronic knee pain, and chronic pain in left hand with associated numbness and tingling. Denies myalgias,joint swelling, and gait problem.  Skin: Denies pallor, rash and wounds.  Neurological: Denies dizziness, headaches, weakness, lightheadedness     Objective:   Physical Exam Filed Vitals:   10/10/14 1029 10/10/14 1111  BP: 155/82 145/98  Pulse: 76 70  Temp: 98.4 F (36.9 C)   TempSrc: Oral   Height: 5\' 7"  (1.702 m)   Weight: 277 lb 12.8 oz (126.009 kg)   SpO2: 99%    General: Vital signs reviewed.  Patient is well-developed and well-nourished, in no acute distress and cooperative with exam.  Head: Normocephalic and atraumatic. Eyes: EOMI, conjunctivae normal, no scleral icterus.  Neck: Supple, trachea midline, no JVD, masses, thyromegaly, or carotid bruit present.  Cardiovascular: RRR, S1 normal, S2 normal, no murmurs, gallops, or rubs. Pulmonary/Chest: Clear to auscultation bilaterally, no wheezes, rales, or rhonchi. Abdominal: Soft, non-tender, non-distended, BS + Extremities: No lower extremity edema bilaterally, pulses symmetric and intact bilaterally. Skin: Warm, dry and intact. No rashes or erythema. Psychiatric: Normal mood and affect. speech  and behavior is normal. Cognition and memory are normal.     Assessment & Plan:   Please see problem oriented assessment and plan.

## 2014-10-10 NOTE — Assessment & Plan Note (Addendum)
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 She cannot remember her last mammogram. Will place order for mammogram.  Colonoscopy: Patient is agreeable. Once orange card is established, we will refer to GI for colonoscopy. She is asymptomatic and no family hx of colon cancer. Pap Smear: Per patient, had one last year and normal. She gets yearly pap smears with her OB/GYN Flu Shot: Refused.  We will check routine labs today: CBC, BMET, Lipid Panel, HgbA1c, and TSH.

## 2014-10-10 NOTE — Patient Instructions (Addendum)
Continue taking your medications as prescribed.   We will schedule a mammogram for you.  We will call you with any abnormal lab results.   Follow up in 6 months.

## 2014-10-10 NOTE — Assessment & Plan Note (Signed)
Patient states she is well controlled on Lexapro 20 mg daily. We will continue this for now. If we plan on switching to [facility] outpatient pharmacy, we would need to do a cross taper and start a new antidepressant.

## 2014-10-11 ENCOUNTER — Telehealth: Payer: Self-pay | Admitting: Internal Medicine

## 2014-10-11 ENCOUNTER — Other Ambulatory Visit: Payer: Self-pay | Admitting: Internal Medicine

## 2014-10-11 DIAGNOSIS — E7800 Pure hypercholesterolemia, unspecified: Secondary | ICD-10-CM

## 2014-10-11 LAB — CBC
Hematocrit: 34.7 % (ref 34.0–46.6)
Hemoglobin: 10.8 g/dL — ABNORMAL LOW (ref 11.1–15.9)
MCH: 23.9 pg — ABNORMAL LOW (ref 26.6–33.0)
MCHC: 31.1 g/dL — ABNORMAL LOW (ref 31.5–35.7)
MCV: 77 fL — ABNORMAL LOW (ref 79–97)
PLATELETS: 289 10*3/uL (ref 150–379)
RBC: 4.52 x10E6/uL (ref 3.77–5.28)
RDW: 16.2 % — AB (ref 12.3–15.4)
WBC: 5.9 10*3/uL (ref 3.4–10.8)

## 2014-10-11 LAB — TSH: TSH: 1.34 u[IU]/mL (ref 0.450–4.500)

## 2014-10-11 LAB — BMP8+ANION GAP
Anion Gap: 18 mmol/L (ref 10.0–18.0)
BUN/Creatinine Ratio: 13 (ref 9–23)
BUN: 9 mg/dL (ref 6–24)
CO2: 21 mmol/L (ref 18–29)
Calcium: 10 mg/dL (ref 8.7–10.2)
Chloride: 102 mmol/L (ref 97–108)
Creatinine, Ser: 0.67 mg/dL (ref 0.57–1.00)
GFR calc Af Amer: 116 mL/min/{1.73_m2} (ref >59)
GFR calc non Af Amer: 101 mL/min/{1.73_m2} (ref >59)
GLUCOSE: 103 mg/dL — AB (ref 65–99)
POTASSIUM: 4.7 mmol/L (ref 3.5–5.2)
Sodium: 141 mmol/L (ref 134–144)

## 2014-10-11 LAB — LIPID PANEL
CHOL/HDL RATIO: 4.6 ratio — AB (ref 0.0–4.4)
Cholesterol, Total: 283 mg/dL — ABNORMAL HIGH (ref 100–199)
HDL: 62 mg/dL (ref >39)
LDL Calculated: 189 mg/dL — ABNORMAL HIGH (ref 0–99)
Triglycerides: 161 mg/dL — ABNORMAL HIGH (ref 0–149)
VLDL Cholesterol Cal: 32 mg/dL (ref 5–40)

## 2014-10-11 MED ORDER — PRAVASTATIN SODIUM 40 MG PO TABS: 40.0000 mg | ORAL_TABLET | Freq: Every day | ORAL | Status: DC

## 2014-10-11 MED ORDER — ZOLPIDEM TARTRATE 5 MG PO TABS
5.0000 mg | ORAL_TABLET | Freq: Every evening | ORAL | Status: DC | PRN
Start: 2014-10-11 — End: 2015-12-19

## 2014-10-11 NOTE — Telephone Encounter (Signed)
Called patient discuss results; however, no answer. I left a message for her to call back.   If patient calls back, I would like to tell her the following:  High Cholesterol: I would recommend starting pravastatin 40 mg daily for her high cholesterol. If she is agreeable, I would send this prescription to the [facility] outpatient pharmacy as it would only be $6 with the orange card.   Pre-Diabetes: Patient is not a diabetic, but she is at risk for becoming diabetic. HgbA1c 6.4%. I would recommend lifestyle modification with diet and exercise at this time. We would recheck her HgbA1c in 3-6 months.

## 2014-10-11 NOTE — Telephone Encounter (Signed)
Patient requesting results from lab work.  Patient would like a call back as soon as possible.

## 2014-10-11 NOTE — Telephone Encounter (Signed)
Sent to Dr Richardson

## 2014-10-11 NOTE — Telephone Encounter (Signed)
I called the patient back.  Do you mind calling in her prescription for Ambien 5 mg QHS prn #30 Refill #5 to the [facility]? I sent her pravastatin electronically.   Thank you for your help Hilda Blades!

## 2014-10-11 NOTE — Telephone Encounter (Signed)
Called pt - stated she had talked to Dr Marvel Plan who explained everything.

## 2014-10-11 NOTE — Telephone Encounter (Signed)
Rx called into pharmacy as directed.

## 2014-10-12 ENCOUNTER — Ambulatory Visit (INDEPENDENT_AMBULATORY_CARE_PROVIDER_SITE_OTHER): Payer: No Typology Code available for payment source | Admitting: Family Medicine

## 2014-10-12 ENCOUNTER — Ambulatory Visit (HOSPITAL_BASED_OUTPATIENT_CLINIC_OR_DEPARTMENT_OTHER)
Admission: RE | Admit: 2014-10-12 | Discharge: 2014-10-12 | Disposition: A | Discharge status: Home / Self Care | Payer: No Typology Code available for payment source | Source: Ambulatory Visit | Attending: Family Medicine | Admitting: Family Medicine

## 2014-10-12 ENCOUNTER — Encounter: Payer: Self-pay | Admitting: Family Medicine

## 2014-10-12 VITALS — BP 136/89 | HR 77 | Ht 67.0 in | Wt 277.0 lb

## 2014-10-12 DIAGNOSIS — M25562 Pain in left knee: Secondary | ICD-10-CM

## 2014-10-12 DIAGNOSIS — M25561 Pain in right knee: Secondary | ICD-10-CM

## 2014-10-12 DIAGNOSIS — M170 Bilateral primary osteoarthritis of knee: Secondary | ICD-10-CM | POA: Insufficient documentation

## 2014-10-12 MED ORDER — METHYLPREDNISOLONE ACETATE 40 MG/ML IJ SUSP
40.0000 mg | Freq: Once | INTRAMUSCULAR | Status: AC
  Administered 2014-10-12: 40 mg via INTRA_ARTICULAR

## 2014-10-12 NOTE — Patient Instructions (Signed)
Get x-rays as you leave today - we will call you with the results. Take tylenol 500mg  1-2 tabs three times a day for pain. Take either diclofenac OR the nabumetone you had previously. Glucosamine sulfate 750mg  twice a day is a supplement that may help. Capsaicin, aspercreme, or biofreeze topically up to four times a day may also help with pain. Cortisone injections are an option - you were given these today. If cortisone injections do not help, there are different types of shots that may help but they take longer to take effect. It's important that you continue to stay active. Straight leg raises, knee extensions 3 sets of 10 once a day (add ankle weight if these become too easy). Start physical therapy to strengthen muscles around the joint that hurts to take pressure off of the joint itself. Shoe inserts with good arch support may be helpful. Walker or cane if needed. Heat or ice 15 minutes at a time 3-4 times a day as needed to help with pain. Water aerobics and cycling with low resistance are the best two types of exercise for arthritis. Follow up with me in 6 weeks.

## 2014-10-12 NOTE — Assessment & Plan Note (Signed)
consistent with DJD.  Radiographs consistent with this.  She will use nabumetone instead of diclofenac.  Previously discussed tylenol, glucosamine, topical medications.  Has cone coverage now - given bilateral injections and went ahead with physical therapy referral as well.  Heat/ice.  F/u in 6 weeks.  After informed written consent, patient was seated on exam table. Left knee was prepped with alcohol swab and utilizing anterolateral approach, patient's left knee was injected intraarticularly with 3:1 marcaine: depomedrol. Patient tolerated the procedure well without immediate complications.  After informed written consent, patient was seated on exam table. Right knee was prepped with alcohol swab and utilizing anterolateral approach, patient's right knee was injected intraarticularly with 3:1 marcaine: depomedrol. Patient tolerated the procedure well without immediate complications.

## 2014-10-12 NOTE — Progress Notes (Signed)
PCP: Osa Craver, MD  Subjective:   HPI: Patient is a 54 y.o. female here for bilateral knee pain.  8/29: Patient reports she's had 3 years of bilateral knee pain. Started mainly after working on concrete floors standing for 7 1/2 hours a day 7 days a week. No acute injury or trauma. Difficulty bending, kneeling, getting up after prolonged sitting. Difficulty walking for exercise. Some catching. Remote history of partial meniscectomy of right knee. No locking, giving out.  9/29: Patient returns with 7/10 bilateral knee pain. Achy and constant pain. Diffusely anterior. Problems bending kneeling, after prolonged driving and sitting. No locking, giving out.  Past Medical History  Diagnosis Date  . Tenosynovitis of foot and ankle 09/28/2013  . Hypertension   . Breast cancer     Current Outpatient Prescriptions on File Prior to Visit  Medication Sig Dispense Refill  . escitalopram (LEXAPRO) 20 MG tablet Take 1 tablet (20 mg total) by mouth daily. 30 tablet 11  . pravastatin (PRAVACHOL) 40 MG tablet Take 1 tablet (40 mg total) by mouth daily. 30 tablet 11  . valsartan-hydrochlorothiazide (DIOVAN-HCT) 80-12.5 MG per tablet Take 1 tablet by mouth daily.     Marland Kitchen zolpidem (AMBIEN) 5 MG tablet Take 1 tablet (5 mg total) by mouth at bedtime as needed for sleep. 30 tablet 5   No current facility-administered medications on file prior to visit.    Past Surgical History  Procedure Laterality Date  . Bunionectomy Left 01/26/2014    @ [facility]  . Cotton osteotomy w/ bone graft Left 01/26/2014    @ [place]  . Neck surgery    . Cholecystectomy    . Breast lumpectomy    . Breast reconstruction    . Knee surgery    . Abdominal hysterectomy      Allergies  Allergen Reactions  . Pineapple     Social History   Social History  . Marital Status: Single    Spouse Name: N/A  . Number of Children: N/A  . Years of Education: N/A   Occupational History  . Not on  file.   Social History Main Topics  . Smoking status: Never Smoker   . Smokeless tobacco: Never Used  . Alcohol Use: No  . Drug Use: No  . Sexual Activity: Not on file   Other Topics Concern  . Not on file   Social History Narrative    No family history on file.  BP 136/89 mmHg  Pulse 77  Ht 5\' 7"  (0.086 m)  Wt 277 lb (125.646 kg)  BMI 43.37 kg/m2  Review of Systems: See HPI above.    Objective:  Physical Exam:  Gen: NAD  Bilateral knees: No gross deformity, ecchymoses, effusions. TTP mainly medial joint lines but also post patellar facets, lateral joint lines bilaterally. FROM. Negative ant/post drawers. Negative valgus/varus testing. Negative lachmanns. Negative mcmurrays, apleys. NV intact distally.    Assessment & Plan:  1. Bilateral knee pain - consistent with DJD.  Radiographs consistent with this.  She will use nabumetone instead of diclofenac.  Previously discussed tylenol, glucosamine, topical medications.  Has cone coverage now - given bilateral injections and went ahead with physical therapy referral as well.  Heat/ice.  F/u in 6 weeks.  After informed written consent, patient was seated on exam table. Left knee was prepped with alcohol swab and utilizing anterolateral approach, patient's left knee was injected intraarticularly with 3:1 marcaine: depomedrol. Patient tolerated the procedure well without immediate complications.  After  informed written consent, patient was seated on exam table. Right knee was prepped with alcohol swab and utilizing anterolateral approach, patient's right knee was injected intraarticularly with 3:1 marcaine: depomedrol. Patient tolerated the procedure well without immediate complications.

## 2014-10-12 NOTE — Addendum Note (Signed)
Addended by: Sherrie George F on: 10/12/2014 04:46 PM   Modules accepted: Orders

## 2014-10-16 ENCOUNTER — Other Ambulatory Visit: Payer: Self-pay | Admitting: Internal Medicine

## 2014-10-16 DIAGNOSIS — Z1231 Encounter for screening mammogram for malignant neoplasm of breast: Secondary | ICD-10-CM

## 2014-10-19 NOTE — Progress Notes (Signed)
Internal Medicine Clinic Attending  Case discussed with Dr. Richardson soon after the resident saw the patient.  We reviewed the resident's history and exam and pertinent patient test results.  I agree with the assessment, diagnosis, and plan of care documented in the resident's note. 

## 2014-10-20 ENCOUNTER — Other Ambulatory Visit: Payer: Self-pay | Admitting: Internal Medicine

## 2014-10-20 NOTE — Telephone Encounter (Signed)
Prescriptions were ready for over 1 week at [facility] [address], they were reshelved, they will get them ready, called pt, she might pick up mon, informed she has about 1 week to pick up and then they will be reshelved

## 2014-10-20 NOTE — Telephone Encounter (Signed)
Pt called requesting cholesterol and Ambien to be filled. Please call pt back.

## 2014-10-26 ENCOUNTER — Ambulatory Visit
Admission: RE | Admit: 2014-10-26 | Discharge: 2014-10-26 | Disposition: A | Discharge status: Home / Self Care | Payer: No Typology Code available for payment source | Source: Ambulatory Visit | Attending: Internal Medicine | Admitting: Internal Medicine

## 2014-10-26 DIAGNOSIS — Z1231 Encounter for screening mammogram for malignant neoplasm of breast: Secondary | ICD-10-CM

## 2014-11-02 ENCOUNTER — Ambulatory Visit (INDEPENDENT_AMBULATORY_CARE_PROVIDER_SITE_OTHER): Payer: No Typology Code available for payment source | Admitting: Family Medicine

## 2014-11-02 ENCOUNTER — Encounter: Payer: Self-pay | Admitting: Family Medicine

## 2014-11-02 VITALS — BP 145/88 | HR 71 | Ht 67.0 in | Wt 270.0 lb

## 2014-11-02 DIAGNOSIS — M79641 Pain in right hand: Secondary | ICD-10-CM

## 2014-11-02 MED ORDER — PREDNISONE 10 MG PO TABS: ORAL_TABLET | ORAL | Status: DC

## 2014-11-02 NOTE — Patient Instructions (Signed)
You have carpal tunnel syndrome and an extensor tendinitis. Wear the wrist brace at nighttime and as often as possible during the day A prednisone dose pack is a consideration - take regularly for the next 6 days. Corticosteroid injection is a consideration to help with pain and inflammation Call me in a week to let me know how you're doing.

## 2014-11-06 DIAGNOSIS — M79641 Pain in right hand: Secondary | ICD-10-CM | POA: Insufficient documentation

## 2014-11-06 NOTE — Assessment & Plan Note (Signed)
combination of extensor tendinitis and carpal tunnel syndrome.  Stressed importance of wearing wrist brace at night and as often as possible during the day.  Take prednisone dose pack.  Consider injection if not improving as expected.  Call us in a week to let us know how she's doing.

## 2014-11-06 NOTE — Progress Notes (Signed)
PCP: Osa Craver, MD  Subjective:   HPI: Patient is a 54 y.o. female here for right hand pain.  Patient denies known injury. She has prior history of carpal tunnel syndrome. Past 3 days she reports mostly dorsal pain in right hand radiating up the forearm. Associated with some swelling, burning from thumb into middle digit. Right handed. Pain level 6/10. No left sided symptoms now. No skin changes, fever, other complaints.  Past Medical History  Diagnosis Date  . Tenosynovitis of foot and ankle 09/28/2013  . Hypertension   . Breast cancer [facility])     Current Outpatient Prescriptions on File Prior to Visit  Medication Sig Dispense Refill  . escitalopram (LEXAPRO) 20 MG tablet Take 1 tablet (20 mg total) by mouth daily. 30 tablet 11  . nabumetone (RELAFEN) 500 MG tablet Take 500 mg by mouth.    . pravastatin (PRAVACHOL) 40 MG tablet Take 1 tablet (40 mg total) by mouth daily. 30 tablet 11  . valsartan-hydrochlorothiazide (DIOVAN-HCT) 80-12.5 MG per tablet Take 1 tablet by mouth daily.     Marland Kitchen zolpidem (AMBIEN) 5 MG tablet Take 1 tablet (5 mg total) by mouth at bedtime as needed for sleep. 30 tablet 5   No current facility-administered medications on file prior to visit.    Past Surgical History  Procedure Laterality Date  . Bunionectomy Left 01/26/2014    @ [facility]  . Cotton osteotomy w/ bone graft Left 01/26/2014    @ [place]  . Neck surgery    . Cholecystectomy    . Breast lumpectomy    . Breast reconstruction    . Knee surgery    . Abdominal hysterectomy      Allergies  Allergen Reactions  . Pineapple     Social History   Social History  . Marital Status: Single    Spouse Name: N/A  . Number of Children: N/A  . Years of Education: N/A   Occupational History  . Not on file.   Social History Main Topics  . Smoking status: Never Smoker   . Smokeless tobacco: Never Used  . Alcohol Use: No  . Drug Use: No  . Sexual Activity: Not on file    Other Topics Concern  . Not on file   Social History Narrative    No family history on file.  BP 145/88 mmHg  Pulse 71  Ht 5\' 7"  (1.702 m)  Wt 270 lb (122.471 kg)  BMI 42.28 kg/m2  Review of Systems: See HPI above.    Objective:  Physical Exam:  Gen: NAD  Right hand/wrist: No gross deformity, swelling, bruising. TTP dorsally over extensor tendons especially of 2nd, 3rd digits.  Mild tenderness at carpal tunnel.  No other tenderness. FROM all digits with 5/5 strength. Collateral ligaments intact. Negative tinels.  Mild positive phalens. Sensation intact to light touch all digits.  Left wrist/hand: FROM digits and wrist without pain.    Assessment & Plan:  1. Right hand pain - combination of extensor tendinitis and carpal tunnel syndrome.  Stressed importance of wearing wrist brace at night and as often as possible during the day.  Take prednisone dose pack.  Consider injection if not improving as expected.  Call us in a week to let us know how she's doing.

## 2014-11-23 ENCOUNTER — Encounter (INDEPENDENT_AMBULATORY_CARE_PROVIDER_SITE_OTHER): Payer: Self-pay

## 2014-11-23 ENCOUNTER — Encounter: Payer: Self-pay | Admitting: Family Medicine

## 2014-11-23 ENCOUNTER — Ambulatory Visit (INDEPENDENT_AMBULATORY_CARE_PROVIDER_SITE_OTHER): Payer: No Typology Code available for payment source | Admitting: Family Medicine

## 2014-11-23 VITALS — BP 132/85 | HR 76 | Ht 67.0 in | Wt 270.0 lb

## 2014-11-23 DIAGNOSIS — M79641 Pain in right hand: Secondary | ICD-10-CM

## 2014-11-23 DIAGNOSIS — M25531 Pain in right wrist: Secondary | ICD-10-CM

## 2014-11-23 NOTE — Patient Instructions (Signed)
I would recommend going ahead with occupational therapy for your wrist and arm pain. Continue wearing the brace in the meantime. Follow up with me in 6 weeks. If still not improving we can consider repeating your nerve conduction studies though I don't think carpal tunnel is responsible for all your symptoms.

## 2014-11-27 NOTE — Assessment & Plan Note (Signed)
Distribution is unusual and exam is improved from possible carpal tunnel syndrome.  Consistent with strain, overuse of extensor tendons and muscles of forearm/hand.  Continue with wrist brace.  Start occupational therapy and home exercises.  F/u in 6 weeks.  Can consider NCV/EMGs though, again, this is not consistent with carpal tunnel syndrome currently.

## 2014-11-27 NOTE — Progress Notes (Signed)
PCP: Osa Craver, MD  Subjective:   HPI: Patient is a 54 y.o. female here for right hand pain.  10/20: Patient denies known injury. She has prior history of carpal tunnel syndrome. Past 3 days she reports mostly dorsal pain in right hand radiating up the forearm. Associated with some swelling, burning from thumb into middle digit. Right handed. Pain level 6/10. No left sided symptoms now. No skin changes, fever, other complaints.  11/10: Patient reports she's only slightly improved from last visit from right hand pain. Reports pain is 5/10, sharp and constant. Radiates up her whole arm. Takes nabumetone, wearing wrist brace. Associated burning dorsal hand into forearm still with numbness. No fever, skin changes, other complaints.  Past Medical History  Diagnosis Date  . Tenosynovitis of foot and ankle 09/28/2013  . Hypertension   . Breast cancer [facility])     Current Outpatient Prescriptions on File Prior to Visit  Medication Sig Dispense Refill  . escitalopram (LEXAPRO) 20 MG tablet Take 1 tablet (20 mg total) by mouth daily. 30 tablet 11  . nabumetone (RELAFEN) 500 MG tablet Take 500 mg by mouth.    . pravastatin (PRAVACHOL) 40 MG tablet Take 1 tablet (40 mg total) by mouth daily. 30 tablet 11  . predniSONE (DELTASONE) 10 MG tablet 6 tabs po day 1, 5 tabs po day 2, 4 tabs po day 3, 3 tabs po day 4, 2 tabs po day 5, 1 tab po day 6 21 tablet 0  . valsartan-hydrochlorothiazide (DIOVAN-HCT) 80-12.5 MG per tablet Take 1 tablet by mouth daily.     Marland Kitchen zolpidem (AMBIEN) 5 MG tablet Take 1 tablet (5 mg total) by mouth at bedtime as needed for sleep. 30 tablet 5   No current facility-administered medications on file prior to visit.    Past Surgical History  Procedure Laterality Date  . Bunionectomy Left 01/26/2014    @ [facility]  . Cotton osteotomy w/ bone graft Left 01/26/2014    @ [place]  . Neck surgery    . Cholecystectomy    . Breast lumpectomy    . Breast  reconstruction    . Knee surgery    . Abdominal hysterectomy      Allergies  Allergen Reactions  . Pineapple     Social History   Social History  . Marital Status: Single    Spouse Name: N/A  . Number of Children: N/A  . Years of Education: N/A   Occupational History  . Not on file.   Social History Main Topics  . Smoking status: Never Smoker   . Smokeless tobacco: Never Used  . Alcohol Use: No  . Drug Use: No  . Sexual Activity: Not on file   Other Topics Concern  . Not on file   Social History Narrative    No family history on file.  BP 132/85 mmHg  Pulse 76  Ht 5\' 7"  (1.702 m)  Wt 270 lb (122.471 kg)  BMI 42.28 kg/m2  Review of Systems: See HPI above.    Objective:  Physical Exam:  Gen: NAD  Right hand/wrist: No gross deformity, swelling, bruising. TTP dorsally over extensor tendons especially of 2nd, 3rd digits, extensor aspect of forearm.  No tenderness at carpal tunnel.  No other tenderness. FROM all digits with 5/5 strength. Collateral ligaments intact. Negative tinels and phalens. Sensation intact to light touch all digits.  Left wrist/hand: FROM digits and wrist without pain.    Assessment & Plan:  1. Right  hand/arm pain - Distribution is unusual and exam is improved from possible carpal tunnel syndrome.  Consistent with strain, overuse of extensor tendons and muscles of forearm/hand.  Continue with wrist brace.  Start occupational therapy and home exercises.  F/u in 6 weeks.  Can consider NCV/EMGs though, again, this is not consistent with carpal tunnel syndrome currently.

## 2014-11-29 ENCOUNTER — Ambulatory Visit (INDEPENDENT_AMBULATORY_CARE_PROVIDER_SITE_OTHER): Payer: No Typology Code available for payment source | Admitting: Internal Medicine

## 2014-11-29 ENCOUNTER — Encounter: Payer: Self-pay | Admitting: Internal Medicine

## 2014-11-29 ENCOUNTER — Telehealth: Payer: Self-pay | Admitting: Family Medicine

## 2014-11-29 VITALS — BP 138/85 | HR 74 | Temp 98.0°F | Wt 271.9 lb

## 2014-11-29 DIAGNOSIS — E7800 Pure hypercholesterolemia, unspecified: Secondary | ICD-10-CM

## 2014-11-29 DIAGNOSIS — R7303 Prediabetes: Secondary | ICD-10-CM

## 2014-11-29 DIAGNOSIS — Z0000 Encounter for general adult medical examination without abnormal findings: Secondary | ICD-10-CM

## 2014-11-29 DIAGNOSIS — Z79899 Other long term (current) drug therapy: Secondary | ICD-10-CM

## 2014-11-29 DIAGNOSIS — F331 Major depressive disorder, recurrent, moderate: Secondary | ICD-10-CM

## 2014-11-29 DIAGNOSIS — I10 Essential (primary) hypertension: Secondary | ICD-10-CM

## 2014-11-29 DIAGNOSIS — M79641 Pain in right hand: Secondary | ICD-10-CM

## 2014-11-29 DIAGNOSIS — H5213 Myopia, bilateral: Secondary | ICD-10-CM

## 2014-11-29 DIAGNOSIS — M216X9 Other acquired deformities of unspecified foot: Secondary | ICD-10-CM

## 2014-11-29 MED ORDER — PRAVASTATIN SODIUM 40 MG PO TABS: 40.0000 mg | ORAL_TABLET | Freq: Every day | ORAL | Status: DC

## 2014-11-29 MED ORDER — LISINOPRIL-HYDROCHLOROTHIAZIDE 20-12.5 MG PO TABS: 1.0000 | ORAL_TABLET | Freq: Every day | ORAL | Status: DC

## 2014-11-29 MED ORDER — FLUOXETINE HCL 40 MG PO CAPS: 40.0000 mg | ORAL_CAPSULE | Freq: Every day | ORAL | Status: DC

## 2014-11-29 NOTE — Assessment & Plan Note (Signed)
History of myopia, now worsening. Had received a new rx for glasses one year, but did not get it filled. Requesting referral back to ophthalmology.  Plan: -referral back to ophthalmology

## 2014-11-29 NOTE — Telephone Encounter (Signed)
I'm not the one who prescribed that.  We've given her voltaren which she could take instead of that.

## 2014-11-29 NOTE — Assessment & Plan Note (Signed)
Sports medicine made referral to Occupational Therapy. Referral in place. I discussed this with our referral staff and patient should be able to be seen by OT. She does not need a referral from [place], the referral process through sports medicine will be sufficient.

## 2014-11-29 NOTE — Assessment & Plan Note (Addendum)
BP Readings from Last 3 Encounters:  11/23/14 132/85  11/02/14 145/88  10/12/14 136/89    Lab Results  Component Value Date   NA 141 10/10/2014   K 4.7 10/10/2014   CREATININE 0.67 10/10/2014    Assessment: Blood pressure control:   Progress toward BP goal:    Comments: Patient is on valsartan-HCTZ 80-12.5 mg daily. Will switch to lisinopril-HCTZ 20-12.5 mg daily once valsartan-HCTZ is completed.  Plan: Medications:  continue current medications

## 2014-11-29 NOTE — Assessment & Plan Note (Addendum)
Patient is currently on Lexapro 20 mg daily. The equivalent dosage in switching to Fluoxetine (Prozac) is 40 mg daily.   Plan: -Switch to Prozac 40 mg daily

## 2014-11-29 NOTE — Assessment & Plan Note (Signed)
Had not picked up pravastatin 40 mg daily.  Plan: -Sent pravastatin 40 mg daily to [facility]

## 2014-11-29 NOTE — Assessment & Plan Note (Signed)
A1c was 6.4 in September 2016. Plan to repeat A1c in March 2017.

## 2014-11-29 NOTE — Progress Notes (Signed)
Subjective:    Patient ID: Maria Sims, female    DOB: 1960-11-12, 53 y.o.   MRN: [id number]  HPI Maria Sims is a 54 y.o. female with PMHx of HTN, pre-diabetes, HLD, extensor tendinitis who presents to the clinic for follow up on HTN. Please see A&P for the status of the patient's chronic medical problems.   Past Medical History  Diagnosis Date  . Tenosynovitis of foot and ankle 09/28/2013  . Hypertension   . Breast cancer [facility])     Outpatient Encounter Prescriptions as of 11/29/2014  Medication Sig Note  . FLUoxetine (PROZAC) 40 MG capsule Take 1 capsule (40 mg total) by mouth daily.   Marland Kitchen lisinopril-hydrochlorothiazide (PRINZIDE,ZESTORETIC) 20-12.5 MG tablet Take 1 tablet by mouth daily.   . nabumetone (RELAFEN) 500 MG tablet Take 500 mg by mouth. 10/12/2014: Received from: [facility]  . pravastatin (PRAVACHOL) 40 MG tablet Take 1 tablet (40 mg total) by mouth daily.   Marland Kitchen zolpidem (AMBIEN) 5 MG tablet Take 1 tablet (5 mg total) by mouth at bedtime as needed for sleep.   . [DISCONTINUED] escitalopram (LEXAPRO) 20 MG tablet Take 1 tablet (20 mg total) by mouth daily.   . [DISCONTINUED] pravastatin (PRAVACHOL) 40 MG tablet Take 1 tablet (40 mg total) by mouth daily.   . [DISCONTINUED] predniSONE (DELTASONE) 10 MG tablet 6 tabs po day 1, 5 tabs po day 2, 4 tabs po day 3, 3 tabs po day 4, 2 tabs po day 5, 1 tab po day 6   . [DISCONTINUED] valsartan-hydrochlorothiazide (DIOVAN-HCT) 80-12.5 MG per tablet Take 1 tablet by mouth daily.  08/29/2013: Received from: External Pharmacy   No facility-administered encounter medications on file as of 11/29/2014.    No family history on file.  Social History   Social History  . Marital Status: Single    Spouse Name: N/A  . Number of Children: N/A  . Years of Education: N/A   Occupational History  . Not on file.   Social History Main Topics  . Smoking status: Never Smoker   . Smokeless tobacco: Never Used  . Alcohol Use: 0.0  oz/week    0 Standard drinks or equivalent per week     Comment: Social  . Drug Use: No  . Sexual Activity: Not on file   Other Topics Concern  . Not on file   Social History Narrative   Review of Systems General: Denies fever, chills, fatigue.  HEENT: Admits to decreased vision at long distances and up close. Respiratory: Denies SOB, cough, DOE.   Cardiovascular: Denies chest pain and palpitations.  Gastrointestinal: Denies abdominal pain, diarrhea, constipation, blood in stool.  Endocrine: Denies polyuria, and polydipsia. Musculoskeletal: Admits to right wrist pain (chronic) and foot pain (chronic). Denies myalgias Neurological: Denies dizziness, headaches, weakness, lightheadedness, numbness     Objective:   Physical Exam Filed Vitals:   11/29/14 1413  BP: 138/85  Pulse: 74  Temp: 98 F (36.7 C)  TempSrc: Oral  Weight: 271 lb 14.4 oz (123.333 kg)  SpO2: 98%   General: Vital signs reviewed.  Patient is well-developed and well-nourished, in no acute distress and cooperative with exam.  Cardiovascular: RRR, S1 normal, S2 normal, no murmurs, gallops, or rubs. Pulmonary/Chest: Clear to auscultation bilaterally, no wheezes, rales, or rhonchi. Abdominal: Soft, non-tender, non-distended, BS + Extremities: No lower extremity edema bilaterally Skin: Warm, dry and intact. No rashes or erythema. Psychiatric: Normal mood and affect. speech and behavior is normal. Cognition and memory are normal.  Assessment & Plan:   Please see problem based assessment and plan.

## 2014-11-29 NOTE — Assessment & Plan Note (Signed)
Patient requesting referral back to Podiatry, Dr. Caffie Pinto who previously managed her chronic foot conditions.  Plan: -Referral to Podiatry with Dr. Caffie Pinto

## 2014-11-29 NOTE — Assessment & Plan Note (Addendum)
Mammogram: Normal 10/2014  Colonoscopy: Needs FOBT cards and GI referral at follow up visit  Pap Smear: Up to date

## 2014-11-29 NOTE — Patient Instructions (Signed)
TAKE FLUOXETINE 40 MG DAILY.  TAKE LISINOPRIL-HCTZ ONE PILL DAILY.  TAKE PRAVASTATIN 40 MG DAILY.  RETURN IN 4 MONTHS.

## 2014-12-01 NOTE — Progress Notes (Signed)
Internal Medicine Clinic Attending  Case discussed with Dr. Richardson soon after the resident saw the patient.  We reviewed the resident's history and exam and pertinent patient test results.  I agree with the assessment, diagnosis, and plan of care documented in the resident's note. 

## 2014-12-01 NOTE — Telephone Encounter (Signed)
Spoke to patient on 11-30-14 and gave her the information provided by the physician.

## 2014-12-06 ENCOUNTER — Ambulatory Visit: Payer: Self-pay | Admitting: Podiatry

## 2014-12-11 ENCOUNTER — Encounter: Payer: Self-pay | Admitting: Podiatry

## 2014-12-11 ENCOUNTER — Ambulatory Visit (INDEPENDENT_AMBULATORY_CARE_PROVIDER_SITE_OTHER): Payer: Self-pay | Admitting: Podiatry

## 2014-12-11 ENCOUNTER — Other Ambulatory Visit: Payer: Self-pay | Admitting: *Deleted

## 2014-12-11 ENCOUNTER — Telehealth: Payer: Self-pay | Admitting: Family Medicine

## 2014-12-11 DIAGNOSIS — M216X2 Other acquired deformities of left foot: Secondary | ICD-10-CM | POA: Insufficient documentation

## 2014-12-11 DIAGNOSIS — M25561 Pain in right knee: Secondary | ICD-10-CM

## 2014-12-11 DIAGNOSIS — M79606 Pain in leg, unspecified: Secondary | ICD-10-CM | POA: Insufficient documentation

## 2014-12-11 DIAGNOSIS — M21962 Unspecified acquired deformity of left lower leg: Secondary | ICD-10-CM | POA: Insufficient documentation

## 2014-12-11 DIAGNOSIS — M79605 Pain in left leg: Secondary | ICD-10-CM

## 2014-12-11 MED ORDER — OXYCODONE-ACETAMINOPHEN 5-325 MG PO TABS: 1.0000 | ORAL_TABLET | Freq: Four times a day (QID) | ORAL | Status: DC | PRN

## 2014-12-11 NOTE — Telephone Encounter (Signed)
Spoke to patient and she stated that she would like to do the MRI for her right knee. Order placed.

## 2014-12-11 NOTE — Progress Notes (Signed)
Subjective: Left foot hurts if stand prolonged time, more than an hour.  Pain is around the bunion and top of foot surgery site.  Helps when wrapped with surgi grip.   Objective: Left bunion site normal color and appearance. No visible edema noted. Positive of residual forefoot varus left.  Upon loading of forefoot, there is some forefoot varus. STJ excess pronation left.   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 Sciatica left side. Pain and swelling on left lower limb with prolonged standing or ambulation.  Residual forefoot varus and STJ hyperpronation left.   Plan: Reviewed findings. Stay off of feet as needed. Ambulate with lace up tennis shoes as tolerated. Return as needed.

## 2014-12-11 NOTE — Telephone Encounter (Signed)
It's only been 2 months since we gave her a cortisone shot for mild arthritis. If she's still having problems and it's the medial aspect of this knee the next step would typically be to go ahead with an MRI to assess for a medial meniscus tear which would account for why she hasn't improved as expected.

## 2014-12-11 NOTE — Patient Instructions (Signed)
Seen for left foot pain. Noted of normal and consistent left foot with existing arthritis and excess pronation of subtalar joint with weight bearing. Need to use lace up tennis shoes. Return as needed.

## 2014-12-16 ENCOUNTER — Ambulatory Visit (HOSPITAL_BASED_OUTPATIENT_CLINIC_OR_DEPARTMENT_OTHER)
Admission: RE | Admit: 2014-12-16 | Discharge: 2014-12-16 | Disposition: A | Discharge status: Home / Self Care | Payer: Self-pay | Source: Ambulatory Visit | Attending: Family Medicine | Admitting: Family Medicine

## 2014-12-16 DIAGNOSIS — M659 Synovitis and tenosynovitis, unspecified: Secondary | ICD-10-CM | POA: Insufficient documentation

## 2014-12-16 DIAGNOSIS — M25561 Pain in right knee: Secondary | ICD-10-CM

## 2014-12-16 DIAGNOSIS — S83203A Other tear of unspecified meniscus, current injury, right knee, initial encounter: Secondary | ICD-10-CM | POA: Insufficient documentation

## 2014-12-16 DIAGNOSIS — X58XXXA Exposure to other specified factors, initial encounter: Secondary | ICD-10-CM | POA: Insufficient documentation

## 2014-12-16 DIAGNOSIS — M25461 Effusion, right knee: Secondary | ICD-10-CM | POA: Insufficient documentation

## 2014-12-19 ENCOUNTER — Ambulatory Visit (INDEPENDENT_AMBULATORY_CARE_PROVIDER_SITE_OTHER): Payer: No Typology Code available for payment source | Admitting: Family Medicine

## 2014-12-19 ENCOUNTER — Encounter: Payer: Self-pay | Admitting: Family Medicine

## 2014-12-19 VITALS — BP 115/83 | HR 69 | Ht 67.0 in | Wt 270.0 lb

## 2014-12-19 DIAGNOSIS — M25562 Pain in left knee: Secondary | ICD-10-CM

## 2014-12-19 DIAGNOSIS — M25561 Pain in right knee: Secondary | ICD-10-CM

## 2014-12-19 DIAGNOSIS — M1711 Unilateral primary osteoarthritis, right knee: Secondary | ICD-10-CM

## 2014-12-19 MED ORDER — METHYLPREDNISOLONE ACETATE 40 MG/ML IJ SUSP
40.0000 mg | Freq: Once | INTRAMUSCULAR | Status: AC
  Administered 2014-12-19: 40 mg via INTRA_ARTICULAR

## 2014-12-20 ENCOUNTER — Ambulatory Visit (INDEPENDENT_AMBULATORY_CARE_PROVIDER_SITE_OTHER): Payer: No Typology Code available for payment source | Admitting: Internal Medicine

## 2014-12-20 ENCOUNTER — Encounter: Payer: Self-pay | Admitting: Internal Medicine

## 2014-12-20 VITALS — BP 152/81 | HR 69 | Temp 98.4°F | Ht 67.0 in | Wt 266.1 lb

## 2014-12-20 DIAGNOSIS — F331 Major depressive disorder, recurrent, moderate: Secondary | ICD-10-CM

## 2014-12-20 MED ORDER — CYCLOBENZAPRINE HCL 5 MG PO TABS: 5.0000 mg | ORAL_TABLET | Freq: Every day | ORAL | Status: DC

## 2014-12-20 NOTE — Progress Notes (Signed)
Internal Medicine Clinic Attending  I saw and evaluated the patient.  I personally confirmed the key portions of the history and exam documented by Dr. Flores and I reviewed pertinent patient test results.  The assessment, diagnosis, and plan were formulated together and I agree with the documentation in the resident's note.  

## 2014-12-20 NOTE — Assessment & Plan Note (Signed)
Her trapezius soreness appears to be from tension due to anxiety. She has numerous pain complaints without much objective evidence. Therefore, I believe that her depression is a significant contributor to her perception of pain and is picky underlying problem. 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 She is sleeping about 5-6 hours per night. Her pH Q9 score today was 16.  I prescribed her Flexeril to help with the muscle spasms and help her get more consistent sleep. She is currently on fluoxetine 40 mg daily, which is the maximum dose. At the next visit, we can seriously consider adding another agent, perhaps buspirone given her anxiety is a primary complaint. She didn't want to add another medication during this visit. I have however referred her to see a psychologist as well as a Education officer, museum to discuss her financial repayment options for her medical bills.

## 2014-12-20 NOTE — Progress Notes (Signed)
Patient ID: Maria Sims, female   DOB: Oct 28, 1960, 54 y.o.   MRN: [id number]   Subjective:   Patient ID: Maria Sims female   DOB: 1960-06-25 54 y.o.   MRN: [id number]  HPI: [name] is a 54 y.o. lady with hypertension, hyperlipidemia, major depressive disorder, insomnia, chronic right knee pain, and prediabetes presenting to clinic with trapezius tightness for the last 2 months.  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 Since that time, she has been very stressed, anxious, and depressed. Her upper neck pain feels like tight knots. She denies any radicular symptoms or hand weakness. Notably, she does have carpal tunnel on the left hand, and her fingers feel numb in the distribution of the median nerve. She is very interested in seeing another psychologist.  Please see the assessment and plan for the status of the patient's chronic medical problems.   Past Medical History  Diagnosis Date  . Tenosynovitis of foot and ankle 09/28/2013  . Hypertension   . Breast cancer [facility])    Current Outpatient Prescriptions  Medication Sig Dispense Refill  . FLUoxetine (PROZAC) 40 MG capsule Take 1 capsule (40 mg total) by mouth daily. 30 capsule 11  . lisinopril-hydrochlorothiazide (PRINZIDE,ZESTORETIC) 20-12.5 MG tablet Take 1 tablet by mouth daily. 30 tablet 11  . nabumetone (RELAFEN) 500 MG tablet Take 500 mg by mouth.    . oxyCODONE-acetaminophen (ROXICET) 5-325 MG tablet Take 1 tablet by mouth every 6 (six) hours as needed for severe pain. 60 tablet 0  . pravastatin (PRAVACHOL) 40 MG tablet Take 1 tablet (40 mg total) by mouth daily. 30 tablet 11  . zolpidem (AMBIEN) 5 MG tablet Take 1 tablet (5 mg total) by mouth at bedtime as needed for sleep. 30 tablet 5   No current facility-administered medications for this visit.   No family history on file. Social History   Social History  . Marital Status: Single    Spouse Name: N/A  . Number of Children:  N/A  . Years of Education: N/A   Social History Main Topics  . Smoking status: Never Smoker   . Smokeless tobacco: Never Used  . Alcohol Use: 0.0 oz/week    0 Standard drinks or equivalent per week     Comment: Social  . Drug Use: No  . Sexual Activity: Not on file   Other Topics Concern  . Not on file   Social History Narrative   Review of Systems  Constitutional: Positive for malaise/fatigue. Negative for fever and chills.  Musculoskeletal: Positive for back pain and neck pain. Negative for myalgias, joint pain and falls.  Neurological: Positive for headaches. Negative for tingling, sensory change and focal weakness.  Psychiatric/Behavioral: Positive for depression. The patient is nervous/anxious and has insomnia.      Objective:  Physical Exam: Filed Vitals:   12/20/14 1058  BP: 152/81  Pulse: 69  Temp: 98.4 F (36.9 C)  TempSrc: Oral  Height: 5\' 7"  (1.702 m)  Weight: 266 lb 1.6 oz (120.702 kg)  SpO2: 99%   General: resting in bed comfortably, tearful when discussing her life stressors HEENT: no scleral icterus, extra-ocular muscles intact, oropharynx without lesions Cardiac: regular rate and rhythm, no rubs, murmurs or gallops Pulm: breathing well, clear to auscultation bilaterally Abd: bowel sounds normal, soft, nondistended, non-tender Ext: warm and well perfused, without pedal edema MSK: her trapezius muscles are tight and sore to the touch. Her neck and shoulders have full range of motion  and she denies any radicular symptoms. I don't see any objective evidence of inflammation in any of her joints. Lymph: no cervical or supraclavicular lymphadenopathy Skin: no rash, hair, or nail changes Neuro: alert and oriented X3, cranial nerves II-XII grossly intact, moving all extremities well  Assessment & Plan:   Please see problem-based charting for my assessment and plan.

## 2014-12-20 NOTE — Patient Instructions (Signed)
Miss Truszkowski,  It was a pleasure meeting you today. I think your neck pain is from severe anxiety, which is completely understandable given your life stressors. As we discussed, I prescribed Flexeril to take at night. Don't take this with Ambien, because it can make you very very drowsy when taken together. Unfortunately, the Flexeril is only a Band-Aid. I think that your anxiety and depression are the root cause of your muscle tension and stress. I referred you to a psychologist so that you can have someone to talk to and cope with all of your stressors. We can discuss going up on your Prozac at the next visit if you'd like.  Where here for you, and we'll do everything we can to help you.  We will see you in one month. If there is anything at all you need in the meantime, please don't hesitate to contact us.  Take care, and happy holidays, Dr. Melburn Hake

## 2014-12-21 NOTE — Assessment & Plan Note (Signed)
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  She does have a small lateral meniscus tear but given degree of her arthritis her pain is more likely due to the DJD.  We discussed options on the phone and will proceed with repeat intraarticular injection today.  Consider viscosupplementation if not improving.  After informed written consent, patient was lying supine on exam table. Right knee was prepped with alcohol swab and utilizing superolateral approach under ultrasound guidance, patient's right knee was injected intraarticularly with 3:1 marcaine: depomedrol. Patient tolerated the procedure well without immediate complications.

## 2014-12-21 NOTE — Progress Notes (Signed)
PCP: Osa Craver, MD  Subjective:   HPI: Patient is a 54 y.o. female here for bilateral knee pain.  8/29: Patient reports she's had 3 years of bilateral knee pain. Started mainly after working on concrete floors standing for 7 1/2 hours a day 7 days a week. No acute injury or trauma. Difficulty bending, kneeling, getting up after prolonged sitting. Difficulty walking for exercise. Some catching. Remote history of partial meniscectomy of right knee. No locking, giving out.  9/29: Patient returns with 7/10 bilateral knee pain. Achy and constant pain. Diffusely anterior. Problems bending kneeling, after prolonged driving and sitting. No locking, giving out.  12/6: Patient returns for repeat cortisone injection for right knee.  Past Medical History  Diagnosis Date  . Tenosynovitis of foot and ankle 09/28/2013  . Hypertension   . Breast cancer [facility])     Current Outpatient Prescriptions on File Prior to Visit  Medication Sig Dispense Refill  . FLUoxetine (PROZAC) 40 MG capsule Take 1 capsule (40 mg total) by mouth daily. 30 capsule 11  . lisinopril-hydrochlorothiazide (PRINZIDE,ZESTORETIC) 20-12.5 MG tablet Take 1 tablet by mouth daily. 30 tablet 11  . nabumetone (RELAFEN) 500 MG tablet Take 500 mg by mouth.    . oxyCODONE-acetaminophen (ROXICET) 5-325 MG tablet Take 1 tablet by mouth every 6 (six) hours as needed for severe pain. 60 tablet 0  . pravastatin (PRAVACHOL) 40 MG tablet Take 1 tablet (40 mg total) by mouth daily. 30 tablet 11  . zolpidem (AMBIEN) 5 MG tablet Take 1 tablet (5 mg total) by mouth at bedtime as needed for sleep. 30 tablet 5   No current facility-administered medications on file prior to visit.    Past Surgical History  Procedure Laterality Date  . Bunionectomy Left 01/26/2014    @ [facility]  . Cotton osteotomy w/ bone graft Left 01/26/2014    @ [place]  . Neck surgery    . Cholecystectomy    . Breast lumpectomy    . Breast  reconstruction    . Knee surgery    . Abdominal hysterectomy      Allergies  Allergen Reactions  . Pineapple     Social History   Social History  . Marital Status: Single    Spouse Name: N/A  . Number of Children: N/A  . Years of Education: N/A   Occupational History  . Not on file.   Social History Main Topics  . Smoking status: Never Smoker   . Smokeless tobacco: Never Used  . Alcohol Use: 0.0 oz/week    0 Standard drinks or equivalent per week     Comment: Social  . Drug Use: No  . Sexual Activity: Not on file   Other Topics Concern  . Not on file   Social History Narrative    No family history on file.  BP 115/83 mmHg  Pulse 69  Ht 5\' 7"  (1.702 m)  Wt 270 lb (122.471 kg)  BMI 42.28 kg/m2  Review of Systems: See HPI above.    Objective:  Physical Exam:  Gen: NAD Exam not repeated today. Bilateral knees: No gross deformity, ecchymoses, effusions. TTP mainly medial joint lines but also post patellar facets, lateral joint lines bilaterally. FROM. Negative ant/post drawers. Negative valgus/varus testing. Negative lachmanns. Negative mcmurrays, apleys. NV intact distally.    Assessment & Plan:  1. 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  She does have a small lateral meniscus tear but given degree of her  arthritis her pain is more likely due to the DJD.  We discussed options on the phone and will proceed with repeat intraarticular injection today.  Consider viscosupplementation if not improving.  After informed written consent, patient was lying supine on exam table. Right knee was prepped with alcohol swab and utilizing superolateral approach under ultrasound guidance, patient's right knee was injected intraarticularly with 3:1 marcaine: depomedrol. Patient tolerated the procedure well without immediate complications.

## 2014-12-25 ENCOUNTER — Telehealth: Payer: Self-pay | Admitting: Licensed Clinical Social Worker

## 2014-12-25 NOTE — Telephone Encounter (Signed)
Maria Sims was referred to CSW for financial counseling resources and referral for counseling.  Pt is uninsured and has met with [facility] Development worker, community.  Maria Sims is active with the [facility].  CSW will discuss referral to [facility] behavioral health providers and [facility] [place] for credit counseling services.   CSW placed called to pt.  CSW left message requesting return call. CSW provided contact hours and phone number.

## 2014-12-27 NOTE — Telephone Encounter (Signed)
CSW received return call from Ms. Bobb.  Pt states she has seen a psychologist in the past and it was beneficial.  But, has not been linked with behavioral health in [place].  Pt states she is looking for counseling services only.  CSW discuss agencies available under the [employer] and included [facility] and [facility] [place] which provides services in [place].  CSW informed Ms. Glassburn [facility] [place] also provides [employer] which may be beneficial for her financial concerns.  Pt aware [facility] and [facility] [place] both utilize walk-in hours to establish care and appointments there after.  Information also provided on [employer] of Care.  Pt aware no referral needed for agencies in [facility]; however, [facility] will need referral.  CSW will mail information to Ms. Horine.  Pt will notify CSW if [facility] is her agency of choice, referral will be placed if chosen.

## 2014-12-27 NOTE — Telephone Encounter (Signed)
CSW placed called to pt.  CSW left message requesting return call. CSW provided contact hours and phone number. 

## 2014-12-28 ENCOUNTER — Encounter: Payer: Self-pay | Admitting: Family Medicine

## 2014-12-28 ENCOUNTER — Ambulatory Visit (INDEPENDENT_AMBULATORY_CARE_PROVIDER_SITE_OTHER): Payer: No Typology Code available for payment source | Admitting: Family Medicine

## 2014-12-28 VITALS — BP 118/82 | HR 66 | Ht 66.0 in | Wt 260.0 lb

## 2014-12-28 DIAGNOSIS — M79641 Pain in right hand: Secondary | ICD-10-CM

## 2014-12-28 NOTE — Patient Instructions (Signed)
Call me next Wednesday if you are not improving (knee) and want [place] to try to get the gel shots.  You have two separate tendinitis of your wrist. Start occupational therapy - this is the most likely thing to help you with this. A cortisone shot may be helpful but I think it would only help with half of your problem. Continue with the wrist brace otherwise. Follow up with me in 6 weeks otherwise but call me if you're having problems with the occupational therapy.

## 2015-01-01 NOTE — Assessment & Plan Note (Signed)
Distribution is unusual - has some evidence de quervains today but other tendinopathy, extensor strain as well.  Will start occupational therapy - advised patient to call me in a couple days if she hasn't heard from them.  Injection a consideration for de quervains but discussed it would only help with some of her current pain.  Continue brace.  F/u in 6 weeks otherwise.

## 2015-01-01 NOTE — Progress Notes (Signed)
PCP: Osa Craver, MD  Subjective:   HPI: Patient is a 54 y.o. female here for right hand pain.  10/20: Patient denies known injury. She has prior history of carpal tunnel syndrome. Past 3 days she reports mostly dorsal pain in right hand radiating up the forearm. Associated with some swelling, burning from thumb into middle digit. Right handed. Pain level 6/10. No left sided symptoms now. No skin changes, fever, other complaints.  11/10: Patient reports she's only slightly improved from last visit from right hand pain. Reports pain is 5/10, sharp and constant. Radiates up her whole arm. Takes nabumetone, wearing wrist brace. Associated burning dorsal hand into forearm still with numbness. No fever, skin changes, other complaints.  12/6: Patient returns with continued 6/10 level of pain in right wrist. Has been using brace. Not heard from occupational therapy (notes from them indicate they called her but she did not receive the message). No skin changes, fever, other complaints. No changes from last visit otherwise.  Past Medical History  Diagnosis Date  . Tenosynovitis of foot and ankle 09/28/2013  . Hypertension   . Breast cancer [facility])     Current Outpatient Prescriptions on File Prior to Visit  Medication Sig Dispense Refill  . cyclobenzaprine (FLEXERIL) 5 MG tablet Take 1 tablet (5 mg total) by mouth at bedtime. 30 tablet 1  . FLUoxetine (PROZAC) 40 MG capsule Take 1 capsule (40 mg total) by mouth daily. 30 capsule 11  . lisinopril-hydrochlorothiazide (PRINZIDE,ZESTORETIC) 20-12.5 MG tablet Take 1 tablet by mouth daily. 30 tablet 11  . nabumetone (RELAFEN) 500 MG tablet Take 500 mg by mouth.    . oxyCODONE-acetaminophen (ROXICET) 5-325 MG tablet Take 1 tablet by mouth every 6 (six) hours as needed for severe pain. 60 tablet 0  . pravastatin (PRAVACHOL) 40 MG tablet Take 1 tablet (40 mg total) by mouth daily. 30 tablet 11  . zolpidem (AMBIEN) 5 MG tablet Take 1  tablet (5 mg total) by mouth at bedtime as needed for sleep. 30 tablet 5   No current facility-administered medications on file prior to visit.    Past Surgical History  Procedure Laterality Date  . Bunionectomy Left 01/26/2014    @ [facility]  . Cotton osteotomy w/ bone graft Left 01/26/2014    @ [place]  . Neck surgery    . Cholecystectomy    . Breast lumpectomy    . Breast reconstruction    . Knee surgery    . Abdominal hysterectomy      Allergies  Allergen Reactions  . Pineapple     Social History   Social History  . Marital Status: Single    Spouse Name: N/A  . Number of Children: N/A  . Years of Education: N/A   Occupational History  . Not on file.   Social History Main Topics  . Smoking status: Never Smoker   . Smokeless tobacco: Never Used  . Alcohol Use: 0.0 oz/week    0 Standard drinks or equivalent per week     Comment: Social  . Drug Use: No  . Sexual Activity: Not on file   Other Topics Concern  . Not on file   Social History Narrative    No family history on file.  BP 118/82 mmHg  Pulse 66  Ht 5\' 6"  (1.676 m)  Wt 260 lb (117.935 kg)  BMI 41.99 kg/m2  Review of Systems: See HPI above.    Objective:  Physical Exam:  Gen: NAD  Right  hand/wrist: No gross deformity, swelling, bruising. TTP dorsally over extensor tendons especially of 2nd, 3rd digits, extensor aspect of forearm, 1st dorsal compartment.  No tenderness at carpal tunnel.  No other tenderness. FROM all digits with 5/5 strength. Collateral ligaments intact. Negative tinels and phalens. Sensation intact to light touch all digits.  Left wrist/hand: FROM digits and wrist without pain.    Assessment & Plan:  1. Right hand/arm pain - Distribution is unusual - has some evidence de quervains today but other tendinopathy, extensor strain as well.  Will start occupational therapy - advised patient to call me in a couple days if she hasn't heard from them.  Injection a  consideration for de quervains but discussed it would only help with some of her current pain.  Continue brace.  F/u in 6 weeks otherwise.

## 2015-01-03 ENCOUNTER — Encounter: Payer: Self-pay | Admitting: Internal Medicine

## 2015-01-03 ENCOUNTER — Ambulatory Visit: Payer: No Typology Code available for payment source

## 2015-01-03 ENCOUNTER — Ambulatory Visit (INDEPENDENT_AMBULATORY_CARE_PROVIDER_SITE_OTHER): Payer: No Typology Code available for payment source | Admitting: Internal Medicine

## 2015-01-03 VITALS — BP 142/83 | HR 70 | Temp 97.7°F | Ht 66.0 in | Wt 265.8 lb

## 2015-01-03 DIAGNOSIS — M1731 Unilateral post-traumatic osteoarthritis, right knee: Secondary | ICD-10-CM

## 2015-01-03 DIAGNOSIS — M1711 Unilateral primary osteoarthritis, right knee: Secondary | ICD-10-CM | POA: Insufficient documentation

## 2015-01-03 NOTE — Assessment & Plan Note (Signed)
The osteoarthritis in her right knee continues to bother her, but is well-controlled with Roxicet and knee injections. She has been walking about 20 minutes a day, 3-4 days a week, which is also helping to loosen it up. She like to start doing water aerobics, and is going to be more mindful about her diet.

## 2015-01-03 NOTE — Patient Instructions (Signed)
Maria Sims,  It was great to see you again today!  I'm glad to hear that the flexeril is helping you sleep and ease the tension in your neck.  For your knee pain, losing weight will help you tremendously and you may be able to avoid knee surgery. I think water aerobics would be a great exercise to strengthen your legs, burn calories, and it's easy on your knees. Give it a shot! Lots of people love it.  I'm happy to fill out the handicap sticker for you. We may neeed to renew this in 6 months, just let us know and we're happy to.  We'll see you in 3-6 months.  Take care, and have a Merry Christmas, Dr. Melburn Hake

## 2015-01-03 NOTE — Progress Notes (Signed)
I saw and evaluated the patient.  I personally confirmed the key portions of Dr. Flores' history and exam and reviewed pertinent patient test results.  The assessment, diagnosis, and plan were formulated together and I agree with the documentation in the resident's note. 

## 2015-01-03 NOTE — Progress Notes (Signed)
Patient ID: Maria Sims, female   DOB: 1960/03/07, 54 y.o.   MRN: [id number] [facility] Subjective:   Patient ID: Maria Sims female   DOB: Apr 27, 1960 54 y.o.   MRN: [id number]  HPI: Maria Sims is a 54 y.o. female with a history of depression, hypertension, and osteoarthritis of the right knee presenting for follow-up for her knee pain and weight loss.  She got a cortisone injection in her right knee on December 8th that has been helping tremendously, and her pain is reasonably well-controlled with Roxicet. The flexeril has been helping her sleep at night and she's noticed her pain tolerance has gotten a little better.  She tells me the Prozac is working very well for her; she feels very "mellow" and is pleased with its effect.  She's been walking more frequently, about :20 per day, 4 days a week. I recommended water aerobics and she was very interested in pursuing this.  Please see the assessment and plan for the status of the patient's chronic medical problems.  Past Medical History  Diagnosis Date  . Tenosynovitis of foot and ankle 09/28/2013  . Hypertension   . Breast cancer [facility])    Current Outpatient Prescriptions  Medication Sig Dispense Refill  . cyclobenzaprine (FLEXERIL) 5 MG tablet Take 1 tablet (5 mg total) by mouth at bedtime. 30 tablet 1  . FLUoxetine (PROZAC) 40 MG capsule Take 1 capsule (40 mg total) by mouth daily. 30 capsule 11  . lisinopril-hydrochlorothiazide (PRINZIDE,ZESTORETIC) 20-12.5 MG tablet Take 1 tablet by mouth daily. 30 tablet 11  . nabumetone (RELAFEN) 500 MG tablet Take 500 mg by mouth.    . oxyCODONE-acetaminophen (ROXICET) 5-325 MG tablet Take 1 tablet by mouth every 6 (six) hours as needed for severe pain. 60 tablet 0  . pravastatin (PRAVACHOL) 40 MG tablet Take 1 tablet (40 mg total) by mouth daily. 30 tablet 11  . zolpidem (AMBIEN) 5 MG tablet Take 1 tablet (5 mg total) by mouth at bedtime as needed for  sleep. 30 tablet 5   No current facility-administered medications for this visit.   No family history on file. Social History   Social History  . Marital Status: Single    Spouse Name: N/A  . Number of Children: N/A  . Years of Education: N/A   Social History Main Topics  . Smoking status: Never Smoker   . Smokeless tobacco: Never Used  . Alcohol Use: 0.0 oz/week    0 Standard drinks or equivalent per week     Comment: Social  . Drug Use: No  . Sexual Activity: Not Asked   Other Topics Concern  . None   Social History Narrative   Review of Systems  Constitutional: Negative for fever, chills, weight loss and malaise/fatigue.  Gastrointestinal: Negative for blood in stool and melena.  Musculoskeletal: Positive for joint pain and neck pain. Negative for myalgias and falls.   Objective:  Physical Exam: Filed Vitals:   01/03/15 1122  BP: 142/83  Pulse: 70  Temp: 97.7 F (36.5 C)  TempSrc: Oral  Height: 5\' 6"  (1.676 m)  Weight: 265 lb 12.8 oz (120.566 kg)  SpO2: 98%   General: resting in bed comfortably, appropriately conversational HEENT: no scleral icterus, extra-ocular muscles intact, oropharynx without lesions Cardiac: regular rate and rhythm, no rubs, murmurs or gallops Pulm: breathing well, clear to auscultation bilaterally Abd: bowel sounds normal, soft, nondistended, non-tender Ext: warm and well perfused, without pedal edema, no objective signs of effusion or  erythema in the right knee, full range of motion and strength Lymph: no cervical or supraclavicular lymphadenopathy Skin: no rash, hair, or nail changes Neuro: alert and oriented X3, cranial nerves II-XII grossly intact, moving all extremities well  Assessment & Plan:   Case discussed with Dr. Eppie Gibson  Osteoarthritis of right knee The osteoarthritis in her right knee continues to bother her, but is well-controlled with Roxicet and knee injections. She has been walking about 20 minutes a day, 3-4 days  a week, which is also helping to loosen it up. She like to start doing water aerobics, and is going to be more mindful about her diet.   Medications Ordered No orders of the defined types were placed in this encounter.   Other Orders No orders of the defined types were placed in this encounter.   Follow Up: Return in about 3 months (around 04/03/2015) for general follow up.

## 2015-01-03 NOTE — Addendum Note (Signed)
Addended by: Oval Linsey D on: 01/03/2015 03:22 PM   Modules accepted: Level of Service

## 2015-01-11 ENCOUNTER — Ambulatory Visit (INDEPENDENT_AMBULATORY_CARE_PROVIDER_SITE_OTHER): Payer: No Typology Code available for payment source | Admitting: Family Medicine

## 2015-01-11 ENCOUNTER — Ambulatory Visit: Payer: No Typology Code available for payment source | Admitting: Occupational Therapy

## 2015-01-11 ENCOUNTER — Encounter: Payer: Self-pay | Admitting: Family Medicine

## 2015-01-11 ENCOUNTER — Encounter (INDEPENDENT_AMBULATORY_CARE_PROVIDER_SITE_OTHER): Payer: Self-pay

## 2015-01-11 VITALS — BP 132/78 | HR 75 | Ht 66.0 in | Wt 262.0 lb

## 2015-01-11 DIAGNOSIS — M1711 Unilateral primary osteoarthritis, right knee: Secondary | ICD-10-CM

## 2015-01-11 MED ORDER — SODIUM HYALURONATE (VISCOSUP) 25 MG/2.5ML IX SOSY
2.5000 mL | PREFILLED_SYRINGE | Freq: Once | INTRA_ARTICULAR | Status: AC
  Administered 2015-01-11: 2.5 mL via INTRA_ARTICULAR

## 2015-01-12 ENCOUNTER — Ambulatory Visit: Payer: No Typology Code available for payment source | Attending: Family Medicine | Admitting: Occupational Therapy

## 2015-01-12 ENCOUNTER — Encounter: Payer: Self-pay | Admitting: Occupational Therapy

## 2015-01-12 DIAGNOSIS — R29898 Other symptoms and signs involving the musculoskeletal system: Secondary | ICD-10-CM

## 2015-01-12 DIAGNOSIS — R279 Unspecified lack of coordination: Secondary | ICD-10-CM | POA: Insufficient documentation

## 2015-01-12 DIAGNOSIS — M79641 Pain in right hand: Secondary | ICD-10-CM

## 2015-01-12 DIAGNOSIS — M25641 Stiffness of right hand, not elsewhere classified: Secondary | ICD-10-CM

## 2015-01-12 DIAGNOSIS — R201 Hypoesthesia of skin: Secondary | ICD-10-CM

## 2015-01-12 DIAGNOSIS — M6281 Muscle weakness (generalized): Secondary | ICD-10-CM | POA: Insufficient documentation

## 2015-01-12 NOTE — Therapy (Signed)
[name] [address] [place], [place], [address] Phone: [phone]   Fax:  [phone]  Occupational Therapy Evaluation  Patient Details  Name: Maria Sims MRN: [id number] Date of Birth: 1960/08/12 Referring Provider: Dr. Blase Mess  Encounter Date: 01/12/2015      OT End of Session - 01/12/15 1803    Visit Number 1   Number of Visits 16   Date for OT Re-Evaluation 03/09/15   Authorization Type [employer] discount   OT Start Time 1401   OT Stop Time 1442   OT Time Calculation (min) 41 min   Activity Tolerance Patient tolerated treatment well      Past Medical History  Diagnosis Date  . Tenosynovitis of foot and ankle 09/28/2013  . Hypertension   . Breast cancer [facility])     Past Surgical History  Procedure Laterality Date  . Bunionectomy Left 01/26/2014    @ [facility]  . Cotton osteotomy w/ bone graft Left 01/26/2014    @ [facility]  . Neck surgery    . Cholecystectomy    . Breast lumpectomy    . Breast reconstruction    . Knee surgery    . Abdominal hysterectomy      There were no vitals filed for this visit.  Visit Diagnosis:  Pain of right hand - Plan: Ot plan of care cert/re-cert  Impaired sensation - Plan: Ot plan of care cert/re-cert  Lack of coordination - Plan: Ot plan of care cert/re-cert  Decreased grip strength of right hand - Plan: Ot plan of care cert/re-cert  Stiffness of joint, hand, right - Plan: Ot plan of care cert/re-cert      Subjective Assessment - 01/12/15 1409    Subjective  This has been going on a long time.   Pertinent History see epic; pt with R hand pain, h/o of carpal tunnel however pain not consistent with carpal tunnel. ? de quervains per MD. Pt has a brace that she wears "all the time" altho she did not have it on today.   Patient Stated Goals I am hoping therapy can ease some of the pain.    Currently in Pain? Yes   Pain Score 9    Pain Location  Hand  pain encompasses thumb, index finger, palm, ventral and dorsal surface of wrist and radiating up into forearm.    Pain Orientation Right   Pain Descriptors / Indicators Burning;Tingling;Numbness   Pain Type Chronic pain   Pain Onset More than a month ago  pt reports almost a year but is getting worse   Pain Frequency Constant   Aggravating Factors  little household duties, lifting objects like my purse, starting car   Pain Relieving Factors cradling it, brace a little but not always. Pt wears the brace all day every day unless she has to put her hands in water.            [facility] OT Assessment - 01/12/15 0001    Assessment   Diagnosis R hand pain, numbness, burning and tingling   Referring Provider Dr. Blase Mess   Onset Date --  About a year ago   Prior Therapy No prior therapy - pt did have 2 injections in 2015 in R hand for carpal tunnel with good relief for about 2 weeks   Precautions   Precautions None   Restrictions   Weight Bearing Restrictions No   Balance Screen   Has the patient fallen in the past 6  months Yes   How many times? 1  pt having issues with R knee and receiving injections    Has the patient had a decrease in activity level because of a fear of falling?  No   Is the patient reluctant to leave their home because of a fear of falling?  No   Home  Environment   Family/patient expects to be discharged to: Private residence   [place]  adult children and granddtr   Type of [place] One level   Bathroom Shower/Tub Tub/Shower unit   Additional Comments Pt has no equipment in bathroom - steadies herself on the wall.    Prior Function   Level of Independence Independent   Vocation Unemployed   ADL   Eating/Feeding Independent   Grooming Modified independent  hard to do hair and uses L hand to brush teeth   Upper Body Bathing Modified independent  uses left hand   Lower Body Bathing Modified independent   Upper  Body Dressing --  mod I/difficulty doing zippers, buttons, donning shoes, tyin   Lower Body Dressing Modified independent   [place]  takes longer and uses L hand   Tub/Shower Transfer Independent   IADL   Shopping Takes care of all shopping needs independently  usually takes dtr with her   Light Housekeeping Performs light daily tasks such as dishwashing, bed making  increased time and difficulty   Meal Prep Plans, prepares and serves adequate meals independently  difficult and increased time;modifies tasks   Programmer, applications own vehicle   Medication Management Is responsible for taking medication in correct dosages at correct time   Physiological scientist financial matters independently (budgets, writes checks, pays rent, bills goes to bank), collects and keeps track of income   Mobility   Mobility Status Independent   Written Expression   Dominant Hand Right   Handwriting 100% legible  pt reports pain and states it is very hard   Vision - History   Baseline Vision Bifocals   Vision Assessment   Eye Alignment Within Functional Limits   Comment vision WFL's   Activity Tolerance   Activity Tolerance Endurance does not limit participation in activity   Cognition   Overall Cognitive Status Within Functional Limits for tasks assessed   Sensation   Light Touch Impaired by gross assessment  absent on volar surface of thumb, index and base of middle   Hot/Cold Appears Intact  states warm water helps   Proprioception Appears Intact   Coordination   Gross Motor Movements are Fluid and Coordinated Yes   Fine Motor Movements are Fluid and Coordinated No   9 Hole Peg Test Right;Left   Right 9 Hole Peg Test 32.05  pt used left hand to support r hand to relieve pain in wrist   Left 9 Hole Peg Test 19.03   Edema   Edema pt with mild edema - pt reports that hand is always swollen when she wakes up.    ROM /  Strength   AROM / PROM / Strength AROM;PROM;Strength   AROM   Overall AROM  Deficits   Overall AROM Comments Pt limited to 75% flexion due to pain. Feel pt has the motor ability to close hand fully based on observation however when asked to close her hand only completed 75% composite flexion. Pt also reluctant to do full thumb opposition however feel pt has  motor ability but is fearful of pain. Pt also limited by fear of pain in wrist flexion   PROM   Overall PROM  Within functional limits for tasks performed   Strength   Overall Strength Deficits   Overall Strength Comments pt with deficits in grip strength, wrist strength. Likley restricted in pinch strength however pt with pain in fingers therefore did not test.    Hand Function   Right Hand Gross Grasp Impaired   Right Hand Grip (lbs) 10  pt limited by fear of pain   Left Hand Gross Grasp Functional   Left Hand Grip (lbs) 70                           OT Short Term Goals - 01/12/15 1750    OT SHORT TERM GOAL #1   Title Pt will be mod I with HEP - 02/09/2015   Status New   OT SHORT TERM GOAL #2   Title Pt will rate pain no more than 7/10 in R hand with simple activity (baseline = 9/10)   Status New   OT SHORT TERM GOAL #3   Title Pt will demonstrate improved coordinaton as evidenced by decreasing time on  9 hole peg test by at least 3 seconds (baseline= 32.05)   Status New   OT SHORT TERM GOAL #4   Title Pt will demonstrate 100% composite flexion in preparation for improved functional use of R hand   Status New   OT SHORT TERM GOAL #5   Title Pt will be indepenent in edema mgmt techniques   Status New   Additional Short Term Goals   Additional Short Term Goals Yes   OT SHORT TERM GOAL #6   Title Pt will improve grip strength by at least 5 pounds to assist with functional tasks.(baseline = 10 pounds)   Status New           OT Long Term Goals - 01/12/15 1755    OT LONG TERM GOAL #1   Title Pt will be  mod I with HEP - 03/09/2015   Status New   OT LONG TERM GOAL #2   Title Pt will rate pain no greater than 3/10 in R hand with functional use.   Status New   OT LONG TERM GOAL #3   Title Pt will demonstrate improved grip strength by at least 10 pounds to assist with functional tasks (baseline = 10 pounds)   Status New   OT LONG TERM GOAL #4   Title Pt will be able to use R hand as dominant for basic self care tasks   Status New   OT LONG TERM GOAL #5   Title Pt will demonstrate improved coordination as evidenced by decreasing time on 9 hole peg test by at least 5 seconds.    Status New               Plan - 01/12/15 1757    Clinical Impression Statement Pt is a 54 year old female referred to this outpatient center due R hand pain, unknown etiology. Pt has h/o of carpal tunnel syndrome 2 years ago.  Pt presents today with the following impairments that impact her ability to use her R dominant hand for fucntional tasks: pain, decreased  AROM, impaired sensation, ecreased strength, decreased coordination, edema, decreased functional use of R dominant hand. Pt will benefit from skilled OT to address these deficits to maximize functional  use of R dominant hand.   Pt will benefit from skilled therapeutic intervention in order to improve on the following deficits (Retired) Decreased coordination;Decreased range of motion;Decreased strength;Increased edema;Impaired UE functional use;Impaired sensation;Impaired perceived functional ability;Pain   Rehab Potential Fair   OT Frequency 2x / week   OT Duration 8 weeks   OT Treatment/Interventions Self-care/ADL training;Electrical Stimulation;Moist Heat;Fluidtherapy;Iontophoresis;Ultrasound;Therapeutic exercise;DME and/or AE instruction;Passive range of motion;Manual Therapy;Splinting;Therapeutic activities;Patient/family education   Plan address pain, intiate HEP if possible   Consulted and Agree with Plan of Care Patient        Problem  List Patient Active Problem List   Diagnosis Date Noted  . Osteoarthritis of right knee 01/03/2015  . Myopia of both eyes 11/29/2014  . Right hand pain 11/06/2014  . Hypercholesteremia 10/11/2014  . Essential hypertension 10/10/2014  . Healthcare maintenance 10/10/2014  . Prediabetes 10/10/2014  . Depression, major, recurrent, moderate ([place]) 06/08/2014  . Equinus deformity of foot, acquired 08/24/2013    Quay Burow, OTR/L 01/12/2015, 6:07 PM  [facility] [address] [place], [place], [address] Phone: [phone]   Fax:  [phone]  Name: Maria Sims MRN: [id number] Date of Birth: 1960-02-22

## 2015-01-12 NOTE — Progress Notes (Signed)
PCP: Osa Craver, MD  Subjective:   HPI: Patient is a 54 y.o. female here for bilateral knee pain.  8/29: Patient reports she's had 3 years of bilateral knee pain. Started mainly after working on concrete floors standing for 7 1/2 hours a day 7 days a week. No acute injury or trauma. Difficulty bending, kneeling, getting up after prolonged sitting. Difficulty walking for exercise. Some catching. Remote history of partial meniscectomy of right knee. No locking, giving out.  9/29: Patient returns with 7/10 bilateral knee pain. Achy and constant pain. Diffusely anterior. Problems bending kneeling, after prolonged driving and sitting. No locking, giving out.  12/6: Patient returns for repeat cortisone injection for right knee.  12/29: Patient returns to start [place] series.  Past Medical History  Diagnosis Date  . Tenosynovitis of foot and ankle 09/28/2013  . Hypertension   . Breast cancer [facility])     Current Outpatient Prescriptions on File Prior to Visit  Medication Sig Dispense Refill  . cyclobenzaprine (FLEXERIL) 5 MG tablet Take 1 tablet (5 mg total) by mouth at bedtime. 30 tablet 1  . FLUoxetine (PROZAC) 40 MG capsule Take 1 capsule (40 mg total) by mouth daily. 30 capsule 11  . lisinopril-hydrochlorothiazide (PRINZIDE,ZESTORETIC) 20-12.5 MG tablet Take 1 tablet by mouth daily. 30 tablet 11  . nabumetone (RELAFEN) 500 MG tablet Take 500 mg by mouth.    . oxyCODONE-acetaminophen (ROXICET) 5-325 MG tablet Take 1 tablet by mouth every 6 (six) hours as needed for severe pain. 60 tablet 0  . pravastatin (PRAVACHOL) 40 MG tablet Take 1 tablet (40 mg total) by mouth daily. 30 tablet 11  . zolpidem (AMBIEN) 5 MG tablet Take 1 tablet (5 mg total) by mouth at bedtime as needed for sleep. 30 tablet 5   No current facility-administered medications on file prior to visit.    Past Surgical History  Procedure Laterality Date  . Bunionectomy Left 01/26/2014    @ [facility]  . Cotton osteotomy w/ bone graft Left 01/26/2014    @ [facility]  . Neck surgery    . Cholecystectomy    . Breast lumpectomy    . Breast reconstruction    . Knee surgery    . Abdominal hysterectomy      Allergies  Allergen Reactions  . Pineapple     Social History   Social History  . Marital Status: Single    Spouse Name: N/A  . Number of Children: N/A  . Years of Education: N/A   Occupational History  . Not on file.   Social History Main Topics  . Smoking status: Never Smoker   . Smokeless tobacco: Never Used  . Alcohol Use: 0.0 oz/week    0 Standard drinks or equivalent per week     Comment: Social  . Drug Use: No  . Sexual Activity: Not on file   Other Topics Concern  . Not on file   Social History Narrative    No family history on file.  BP 132/78 mmHg  Pulse 75  Ht 5\' 6"  (1.676 m)  Wt 262 lb (118.842 kg)  BMI 42.31 kg/m2  Review of Systems: See HPI above.    Objective:  Physical Exam:  Gen: NAD Exam not repeated today. Bilateral knees: No gross deformity, ecchymoses, effusions. TTP mainly medial joint lines but also post patellar facets, lateral joint lines bilaterally. FROM. Negative ant/post drawers. Negative valgus/varus testing. Negative lachmanns. Negative mcmurrays, apleys. NV intact distally.    Assessment &  Plan:  1. 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  She does have a small lateral meniscus tear but given degree of her arthritis her pain is more likely due to the DJD.  Intraarticular cortisone injection without much benefit - first supartz injection given today.  F/u in 1 week for second injection.  After informed written consent, patient was lying supine on exam table. Right knee was prepped with alcohol swab and utilizing superolateral approach under ultrasound guidance, patient's right knee was injected intraarticularly with 19mL marcaine followed by Jacklyn Shell. Patient tolerated the  procedure well without immediate complications.

## 2015-01-12 NOTE — Assessment & Plan Note (Signed)
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  She does have a small lateral meniscus tear but given degree of her arthritis her pain is more likely due to the DJD.  Intraarticular cortisone injection without much benefit - first supartz injection given today.  F/u in 1 week for second injection.  After informed written consent, patient was lying supine on exam table. Right knee was prepped with alcohol swab and utilizing superolateral approach under ultrasound guidance, patient's right knee was injected intraarticularly with 87mL marcaine followed by Jacklyn Shell. Patient tolerated the procedure well without immediate complications.

## 2015-01-16 ENCOUNTER — Ambulatory Visit: Payer: No Typology Code available for payment source | Attending: Family Medicine | Admitting: Occupational Therapy

## 2015-01-16 DIAGNOSIS — R201 Hypoesthesia of skin: Secondary | ICD-10-CM | POA: Insufficient documentation

## 2015-01-16 DIAGNOSIS — M79641 Pain in right hand: Secondary | ICD-10-CM

## 2015-01-16 DIAGNOSIS — M25641 Stiffness of right hand, not elsewhere classified: Secondary | ICD-10-CM | POA: Insufficient documentation

## 2015-01-16 DIAGNOSIS — R29898 Other symptoms and signs involving the musculoskeletal system: Secondary | ICD-10-CM

## 2015-01-16 DIAGNOSIS — R279 Unspecified lack of coordination: Secondary | ICD-10-CM

## 2015-01-16 DIAGNOSIS — M6281 Muscle weakness (generalized): Secondary | ICD-10-CM | POA: Insufficient documentation

## 2015-01-16 NOTE — Patient Instructions (Addendum)
Flexor Tendon Gliding (Active Hook Fist)   With fingers and knuckles straight, bend middle and tip joints. Do not bend large knuckles. Repeat 10 times. Do 3 sessions per day.   Flexor Tendon Gliding (Active Full Fist)   Straighten all fingers, then make a fist, bending all joints. Repeat 10 times. Do 3sessions per day.   Flexor Tendon Gliding (Active Straight Fist)   Start with fingers straight. Bend knuckles and middle joints. Keep fingertip joints straight to touch base of palm. Repeat 10 times. Do 3 sessions per day.   MP Flexion (Active Isolated)   Bend ALL fingers at large knuckle, keeping other fingers straight. Do not bend tips. Repeat 10 times. Do 3 sessions per day.   AROM: Wrist Extension   .  With  palm down, bend wrist up. Repeat __10__ times per set.  Do __3_ sessions per day.            AROM: Wrist Radial / Ulnar Deviation    Gently bend wrist to little finger side as far as possible Repeat  10 X         Do   3  sessions per day.  Copyright  VHI. All rights reserved.  AROM: Thumb Flexion / Extension    Actively bend right thumb across palm as far as possible. Hold ____ seconds. Relax. Then pull thumb back into hitchhike position. Repeat 10times per set. Do 3 sessions per day.  Copyright  VHI. All rights reserved.

## 2015-01-16 NOTE — Therapy (Signed)
[facility] [address] [employer] [place], [place], [address] Phone: [phone]   Fax:  [phone]  Occupational Therapy Treatment  Patient Details  Name: Maria Sims MRN: [id number] Date of Birth: 06-27-1960 Referring Provider: Dr. Blase Mess  Encounter Date: 01/16/2015      OT End of Session - 01/16/15 0904    Visit Number 2   Number of Visits 16   Date for OT Re-Evaluation 03/09/15   Authorization Type [employer] discount   OT Start Time 0858  pt arrived late   OT Stop Time 0940   OT Time Calculation (min) 42 min   Activity Tolerance Patient tolerated treatment well   Behavior During Therapy [facility] for tasks assessed/performed      Past Medical History  Diagnosis Date  . Tenosynovitis of foot and ankle 09/28/2013  . Hypertension   . Breast cancer [facility])     Past Surgical History  Procedure Laterality Date  . Bunionectomy Left 01/26/2014    @ [facility]  . Cotton osteotomy w/ bone graft Left 01/26/2014    @ [place]  . Neck surgery    . Cholecystectomy    . Breast lumpectomy    . Breast reconstruction    . Knee surgery    . Abdominal hysterectomy      There were no vitals filed for this visit.  Visit Diagnosis:  Pain of right hand  Impaired sensation  Lack of coordination  Decreased grip strength of right hand  Stiffness of joint, hand, right      Subjective Assessment - 01/16/15 0903    Subjective  "It just seems to be getting worse.  I wake up every night"   Pertinent History see epic; pt with R hand pain, h/o of carpal tunnel however pain not consistent with carpal tunnel. ? de quervains per MD. Pt has a brace that she wears "all the time" altho she did not have it on today.   Patient Stated Goals I am hoping therapy can ease some of the pain.    Currently in Pain? Yes   Pain Score 7    Pain Location Hand   Pain Orientation Right   Pain Descriptors / Indicators  Tingling;Numbness;Burning   Pain Type Chronic pain   Pain Frequency Constant   Aggravating Factors  use    Pain Relieving Factors relaxing                      OT Treatments/Exercises (OP) - 01/16/15 0001    Modalities   Modalities Fluidotherapy   RUE Fluidotherapy   Number Minutes Fluidotherapy 10 Minutes   RUE Fluidotherapy Location Hand;Wrist   Comments with no adverse reactions for pain   Splinting   Splinting Pt fitted for pre-fab forearm based thumb spica splint due to reports of pain in thumb (dorsal/volarly and radial wrist) as current splint was only wrist cock up.  Pt instructed in splint wear/care and verbalized understanding.                OT Education - 01/16/15 [phone]    Education provided Yes   Education Details Initial HEP (see pt instructions); Heat prior to HEP/Ice after activity or exercise; Basic activity modifications (frequent breaks, avoid lifting/gripping/pinching/pushing/pulling, use LUE when able, avoid combined wrist/thumb movement, keep wrist straight);  Basic CTS info   Person(s) Educated Patient   Methods Explanation;Verbal cues;Handout;Demonstration   Comprehension Verbalized understanding;Returned demonstration  OT Short Term Goals - 01/12/15 1750    OT SHORT TERM GOAL #1   Title Pt will be mod I with HEP - 02/09/2015   Status New   OT SHORT TERM GOAL #2   Title Pt will rate pain no more than 7/10 in R hand with simple activity (baseline = 9/10)   Status New   OT SHORT TERM GOAL #3   Title Pt will demonstrate improved coordinaton as evidenced by decreasing time on  9 hole peg test by at least 3 seconds (baseline= 32.05)   Status New   OT SHORT TERM GOAL #4   Title Pt will demonstrate 100% composite flexion in preparation for improved functional use of R hand   Status New   OT SHORT TERM GOAL #5   Title Pt will be indepenent in edema mgmt techniques   Status New   Additional Short Term Goals   Additional Short  Term Goals Yes   OT SHORT TERM GOAL #6   Title Pt will improve grip strength by at least 5 pounds to assist with functional tasks.(baseline = 10 pounds)   Status New           OT Long Term Goals - 01/12/15 1755    OT LONG TERM GOAL #1   Title Pt will be mod I with HEP - 03/09/2015   Status New   OT LONG TERM GOAL #2   Title Pt will rate pain no greater than 3/10 in R hand with functional use.   Status New   OT LONG TERM GOAL #3   Title Pt will demonstrate improved grip strength by at least 10 pounds to assist with functional tasks (baseline = 10 pounds)   Status New   OT LONG TERM GOAL #4   Title Pt will be able to use R hand as dominant for basic self care tasks   Status New   OT LONG TERM GOAL #5   Title Pt will demonstrate improved coordination as evidenced by decreasing time on 9 hole peg test by at least 5 seconds.    Status New               Plan - 01/16/15 1003    Clinical Impression Statement Pt reports incr comfort with updated thumb spica splint during session.  Pt demo improved (approx 90% gross finger flex) with tendon glides.  However, pt continues to report significant burning and numbness with movement.   Plan Fludio/Ice, review HEP   Consulted and Agree with Plan of Care Patient        Problem List Patient Active Problem List   Diagnosis Date Noted  . Osteoarthritis of right knee 01/03/2015  . Myopia of both eyes 11/29/2014  . Right hand pain 11/06/2014  . Hypercholesteremia 10/11/2014  . Essential hypertension 10/10/2014  . Healthcare maintenance 10/10/2014  . Prediabetes 10/10/2014  . Depression, major, recurrent, moderate ([place]) 06/08/2014  . Equinus deformity of foot, acquired 08/24/2013    [facility] 01/16/2015, 10:07 AM  [facility] [address] [place], [place], [address] Phone: [phone]   Fax:  [phone]  Name: Kortnee Geoffroy MRN: [id number] Date of Birth:  1960-07-23  Vianne Bulls, OTR/L 01/16/2015 10:07 AM

## 2015-01-17 ENCOUNTER — Ambulatory Visit (INDEPENDENT_AMBULATORY_CARE_PROVIDER_SITE_OTHER): Payer: No Typology Code available for payment source | Admitting: Internal Medicine

## 2015-01-17 ENCOUNTER — Encounter: Payer: Self-pay | Admitting: Internal Medicine

## 2015-01-17 VITALS — BP 129/80 | HR 64 | Temp 98.1°F | Ht 66.0 in | Wt 268.8 lb

## 2015-01-17 DIAGNOSIS — R7303 Prediabetes: Secondary | ICD-10-CM

## 2015-01-17 DIAGNOSIS — F331 Major depressive disorder, recurrent, moderate: Secondary | ICD-10-CM

## 2015-01-17 DIAGNOSIS — I10 Essential (primary) hypertension: Secondary | ICD-10-CM

## 2015-01-17 DIAGNOSIS — Z0000 Encounter for general adult medical examination without abnormal findings: Secondary | ICD-10-CM

## 2015-01-17 LAB — POCT GLYCOSYLATED HEMOGLOBIN (HGB A1C): HEMOGLOBIN A1C: 6.4

## 2015-01-17 LAB — GLUCOSE, CAPILLARY: GLUCOSE-CAPILLARY: 98 mg/dL (ref 65–99)

## 2015-01-17 MED ORDER — BUSPIRONE HCL 7.5 MG PO TABS: 7.5000 mg | ORAL_TABLET | Freq: Two times a day (BID) | ORAL | Status: DC

## 2015-01-17 NOTE — Assessment & Plan Note (Signed)
Lab Results  Component Value Date   HGBA1C 6.4 01/17/2015   HGBA1C 6.4 10/10/2014     Assessment: Diabetes control:  Controlled Progress toward A1C goal:    At goal <7 Comments: Diet controlled  Plan: Medications:  Continue to monitor A1c every 6 months, diet controlled Home glucose monitoring: Frequency:  None Timing:  None Instruction/counseling given: discussed the need for weight loss and discussed diet Educational resources provided:  Diet recommendations Self management tools provided:   Other plans: 6 month follow up

## 2015-01-17 NOTE — Progress Notes (Signed)
[facility] Attending  Case discussed with Dr. Marvel Plan at the time of the visit.  We reviewed the resident's history and exam and pertinent patient test results.  I agree with the assessment, diagnosis, and plan of care documented in the resident's note.

## 2015-01-17 NOTE — Assessment & Plan Note (Signed)
Patient has both depression and generalized anxiety. Patient was recently switched to Prozac 40 mg daily at last visit with me. In December, PHQ-9 score was 16. Today, score has improved to 11. GAD score for anxiety is still high at 12. Patient is interested in trying something for anxiety.   Plan: -Continue Prozac 40 mg daily -Add Buspirone 7.5 mg BID which can be uptitrated based on GAD score -Consider adding Trazodone

## 2015-01-17 NOTE — Progress Notes (Signed)
   Subjective:    Patient ID: Maria Sims, female    DOB: 07/22/1960, 55 y.o.   MRN: [id number]  HPI Maria Sims is a 55 y.o. female with PMHx of R knee OA, Depression, HLD, T2DM who presents to the clinic for follow up for T2DM and depression. Please see A&P for the status of the patient's chronic medical problems.   Past Medical History  Diagnosis Date  . Tenosynovitis of foot and ankle 09/28/2013  . Hypertension   . Breast cancer [facility])     Outpatient Encounter Prescriptions as of 01/17/2015  Medication Sig Note  . busPIRone (BUSPAR) 7.5 MG tablet Take 1 tablet (7.5 mg total) by mouth 2 (two) times daily.   . cyclobenzaprine (FLEXERIL) 5 MG tablet Take 1 tablet (5 mg total) by mouth at bedtime.   Marland Kitchen FLUoxetine (PROZAC) 40 MG capsule Take 1 capsule (40 mg total) by mouth daily.   Marland Kitchen lisinopril-hydrochlorothiazide (PRINZIDE,ZESTORETIC) 20-12.5 MG tablet Take 1 tablet by mouth daily.   . nabumetone (RELAFEN) 500 MG tablet Take 500 mg by mouth. 10/12/2014: Received from: [facility]  . oxyCODONE-acetaminophen (ROXICET) 5-325 MG tablet Take 1 tablet by mouth every 6 (six) hours as needed for severe pain.   . pravastatin (PRAVACHOL) 40 MG tablet Take 1 tablet (40 mg total) by mouth daily.   Marland Kitchen zolpidem (AMBIEN) 5 MG tablet Take 1 tablet (5 mg total) by mouth at bedtime as needed for sleep.    No facility-administered encounter medications on file as of 01/17/2015.    No family history on file. Uncle- Prostate Cancer  Social History   Social History  . Marital Status: Single    Spouse Name: N/A  . Number of Children: N/A  . Years of Education: N/A   Occupational History  . Not on file.   Social History Main Topics  . Smoking status: Never Smoker   . Smokeless tobacco: Never Used  . Alcohol Use: 0.0 oz/week    0 Standard drinks or equivalent per week     Comment: Social  . Drug Use: No  . Sexual Activity: Not on file   Other Topics Concern  . Not on file   Social  History Narrative    Review of Systems General: Denies change in appetite  Respiratory: Denies SOB, cough, DOE.   Cardiovascular: Denies chest pain and palpitations.  Gastrointestinal: Denies diarrhea, constipation, blood in stool.  Endocrine: Denies polyuria, and polydipsia. Musculoskeletal: Admits to right knee and right hand pain. Neurological: Denies dizziness, headaches, weakness, lightheadedness Psychiatric/Behavioral: Admits to depression and anxiety.     Objective:   Physical Exam Filed Vitals:   01/17/15 1025  BP: 129/80  Pulse: 64  Temp: 98.1 F (36.7 C)  TempSrc: Oral  Height: 5\' 6"  (1.676 m)  Weight: 268 lb 12.8 oz (121.927 kg)  SpO2: 100%   General: Vital signs reviewed.  Patient is obese, in no acute distress and cooperative with exam.  Neck: No carotid bruit present.  Cardiovascular: RRR, S1 normal, S2 normal. Pulmonary/Chest: Clear to auscultation bilaterally, no wheezes, rales, or rhonchi. Musculoskeletal: Right wrist brace in place. Pain on palpation of right knee.  Extremities: No lower extremity edema bilaterally. Skin: Warm, dry and intact. No rashes or erythema. Psychiatric: Normal mood and affect. speech and behavior is normal.      Assessment & Plan:   Please see problem based assessment and plan.

## 2015-01-17 NOTE — Patient Instructions (Signed)
FOR YOUR ANXIETY: TAKE BUSPIRONE 7.5 MG TWICE DAY IN ADDITION TO THE PROZAC 40 MG DAILY.   CONTINUE ALL OF YOUR MEDICATIONS THE SAME.   PLEASE USE THE STOOL CARDS AND RETURN THEM TO USE.  CONTINUE TO WORK ON YOUR DIET AND EXERCISE.   Prediabetes Eating Plan Prediabetes--also called impaired glucose tolerance or impaired fasting glucose--is a condition that causes blood sugar (blood glucose) levels to be higher than normal. Following a healthy diet can help to keep prediabetes under control. It can also help to lower the risk of type 2 diabetes and heart disease, which are increased in people who have prediabetes. Along with regular exercise, a healthy diet:  Promotes weight loss.  Helps to control blood sugar levels.  Helps to improve the way that the body uses insulin. WHAT DO I NEED TO KNOW ABOUT THIS EATING PLAN?  Use the glycemic index (GI) to plan your meals. The index tells you how quickly a food will raise your blood sugar. Choose low-GI foods. These foods take a longer time to raise blood sugar.  Pay close attention to the amount of carbohydrates in the food that you eat. Carbohydrates increase blood sugar levels.  Keep track of how many calories you take in. Eating the right amount of calories will help you to achieve a healthy weight. Losing about 7 percent of your starting weight can help to prevent type 2 diabetes.  You may want to follow a Mediterranean diet. This diet includes a lot of vegetables, lean meats or fish, whole grains, fruits, and healthy oils and fats. WHAT FOODS CAN I EAT? Grains Whole grains, such as whole-wheat or whole-grain breads, crackers, cereals, and pasta. Unsweetened oatmeal. Bulgur. Barley. Quinoa. Brown rice. Corn or whole-wheat flour tortillas or taco shells. Vegetables Lettuce. Spinach. Peas. Beets. Cauliflower. Cabbage. Broccoli. Carrots. Tomatoes. Squash. Eggplant. Herbs. Peppers. Onions. Cucumbers. Brussels sprouts. Fruits Berries. Bananas.  Apples. Oranges. Grapes. Papaya. Mango. Pomegranate. Kiwi. Grapefruit. Cherries. Meats and Other Protein Sources Seafood. Lean meats, such as chicken and [place] or lean cuts of pork and beef. Tofu. Eggs. Nuts. Beans. Dairy Low-fat or fat-free dairy products, such as yogurt, cottage cheese, and cheese. Beverages Water. Tea. Coffee. Sugar-free or diet soda. Seltzer water. Milk. Milk alternatives, such as soy or almond milk. Condiments Mustard. Relish. Low-fat, low-sugar ketchup. Low-fat, low-sugar barbecue sauce. Low-fat or fat-free mayonnaise. Sweets and Desserts Sugar-free or low-fat pudding. Sugar-free or low-fat ice cream and other frozen treats. Fats and Oils Avocado. Walnuts. Olive oil. The items listed above may not be a complete list of recommended foods or beverages. Contact your dietitian for more options.  WHAT FOODS ARE NOT RECOMMENDED? Grains Refined white flour and flour products, such as bread, pasta, snack foods, and cereals. Beverages Sweetened drinks, such as sweet iced tea and soda. Sweets and Desserts Baked goods, such as cake, cupcakes, pastries, cookies, and cheesecake. The items listed above may not be a complete list of foods and beverages to avoid. Contact your dietitian for more information.   This information is not intended to replace advice given to you by your health care provider. Make sure you discuss any questions you have with your health care provider.   Document Released: 05/16/2014 Document Reviewed: 05/16/2014 Elsevier Interactive Patient Education [employer].

## 2015-01-17 NOTE — Assessment & Plan Note (Signed)
Mammogram: Due 11/2015 Colonoscopy: Provided with stool cards

## 2015-01-17 NOTE — Assessment & Plan Note (Signed)
BP Readings from Last 3 Encounters:  01/17/15 129/80  01/11/15 132/78  01/03/15 142/83    Lab Results  Component Value Date   NA 141 10/10/2014   K 4.7 10/10/2014   CREATININE 0.67 10/10/2014    Assessment: Blood pressure control:  Controlled Progress toward BP goal:   At goal Comments: Compliant with Lisinopril-HCTZ 20-12.5 mg daily  Plan: Medications:  continue current medications Educational resources provided: brochure, handout, video

## 2015-01-18 ENCOUNTER — Ambulatory Visit (INDEPENDENT_AMBULATORY_CARE_PROVIDER_SITE_OTHER): Payer: No Typology Code available for payment source | Admitting: Family Medicine

## 2015-01-18 ENCOUNTER — Encounter: Payer: Self-pay | Admitting: Family Medicine

## 2015-01-18 VITALS — BP 121/76 | HR 67 | Ht 66.0 in | Wt 265.0 lb

## 2015-01-18 DIAGNOSIS — M1711 Unilateral primary osteoarthritis, right knee: Secondary | ICD-10-CM

## 2015-01-18 MED ORDER — SODIUM HYALURONATE (VISCOSUP) 25 MG/2.5ML IX SOSY
2.5000 mL | PREFILLED_SYRINGE | Freq: Once | INTRA_ARTICULAR | Status: AC
  Administered 2015-01-18: 2.5 mL via INTRA_ARTICULAR

## 2015-01-19 NOTE — Assessment & Plan Note (Signed)
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  She does have a small lateral meniscus tear but given degree of her arthritis her pain is more likely due to the DJD.  Intraarticular cortisone injection without much benefit - second supartz injection given today.  F/u in 1 week for third injection.  After informed written consent, patient was lying supine on exam table. Right knee was prepped with alcohol swab and utilizing superolateral approach under ultrasound guidance, patient's right knee was injected intraarticularly with 2mL marcaine followed by Jacklyn Shell. Patient tolerated the procedure well without immediate complications.

## 2015-01-19 NOTE — Progress Notes (Signed)
PCP: Osa Craver, MD  Subjective:   HPI: Patient is a 55 y.o. female here for bilateral knee pain.  8/29: Patient reports she's had 3 years of bilateral knee pain. Started mainly after working on concrete floors standing for 7 1/2 hours a day 7 days a week. No acute injury or trauma. Difficulty bending, kneeling, getting up after prolonged sitting. Difficulty walking for exercise. Some catching. Remote history of partial meniscectomy of right knee. No locking, giving out.  9/29: Patient returns with 7/10 bilateral knee pain. Achy and constant pain. Diffusely anterior. Problems bending kneeling, after prolonged driving and sitting. No locking, giving out.  12/6: Patient returns for repeat cortisone injection for right knee.  12/29: Patient returns to start [place] series.  01/18/15: Patient returns for second supartz injection. Pain level 5/10.  Past Medical History  Diagnosis Date  . Tenosynovitis of foot and ankle 09/28/2013  . Hypertension   . Breast cancer [facility])     Current Outpatient Prescriptions on File Prior to Visit  Medication Sig Dispense Refill  . busPIRone (BUSPAR) 7.5 MG tablet Take 1 tablet (7.5 mg total) by mouth 2 (two) times daily. 60 tablet 5  . cyclobenzaprine (FLEXERIL) 5 MG tablet Take 1 tablet (5 mg total) by mouth at bedtime. 30 tablet 1  . FLUoxetine (PROZAC) 40 MG capsule Take 1 capsule (40 mg total) by mouth daily. 30 capsule 11  . lisinopril-hydrochlorothiazide (PRINZIDE,ZESTORETIC) 20-12.5 MG tablet Take 1 tablet by mouth daily. 30 tablet 11  . nabumetone (RELAFEN) 500 MG tablet Take 500 mg by mouth.    . oxyCODONE-acetaminophen (ROXICET) 5-325 MG tablet Take 1 tablet by mouth every 6 (six) hours as needed for severe pain. 60 tablet 0  . pravastatin (PRAVACHOL) 40 MG tablet Take 1 tablet (40 mg total) by mouth daily. 30 tablet 11  . zolpidem (AMBIEN) 5 MG tablet Take 1 tablet (5 mg total) by mouth at bedtime as needed for sleep. 30  tablet 5   No current facility-administered medications on file prior to visit.    Past Surgical History  Procedure Laterality Date  . Bunionectomy Left 01/26/2014    @ [facility]  . Cotton osteotomy w/ bone graft Left 01/26/2014    @ [place]  . Neck surgery    . Cholecystectomy    . Breast lumpectomy    . Breast reconstruction    . Knee surgery    . Abdominal hysterectomy      Allergies  Allergen Reactions  . Pineapple     Social History   Social History  . Marital Status: Single    Spouse Name: N/A  . Number of Children: N/A  . Years of Education: N/A   Occupational History  . Not on file.   Social History Main Topics  . Smoking status: Never Smoker   . Smokeless tobacco: Never Used  . Alcohol Use: 0.0 oz/week    0 Standard drinks or equivalent per week     Comment: Social  . Drug Use: No  . Sexual Activity: Not on file   Other Topics Concern  . Not on file   Social History Narrative    No family history on file.  BP 121/76 mmHg  Pulse 67  Ht 5\' 6"  (1.676 m)  Wt 265 lb (120.203 kg)  BMI 42.79 kg/m2  Review of Systems: See HPI above.    Objective:  Physical Exam:  Gen: NAD Exam not repeated today. Bilateral knees: No gross deformity, ecchymoses, effusions. TTP  mainly medial joint lines but also post patellar facets, lateral joint lines bilaterally. FROM. Negative ant/post drawers. Negative valgus/varus testing. Negative lachmanns. Negative mcmurrays, apleys. NV intact distally.    Assessment & Plan:  1. 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  She does have a small lateral meniscus tear but given degree of her arthritis her pain is more likely due to the DJD.  Intraarticular cortisone injection without much benefit - second supartz injection given today.  F/u in 1 week for third injection.  After informed written consent, patient was lying supine on exam table. Right knee was prepped with alcohol  swab and utilizing superolateral approach under ultrasound guidance, patient's right knee was injected intraarticularly with 31mL marcaine followed by Jacklyn Shell. Patient tolerated the procedure well without immediate complications.

## 2015-01-22 ENCOUNTER — Ambulatory Visit: Payer: No Typology Code available for payment source | Admitting: Occupational Therapy

## 2015-01-24 ENCOUNTER — Ambulatory Visit: Payer: No Typology Code available for payment source | Admitting: Occupational Therapy

## 2015-01-25 ENCOUNTER — Encounter: Payer: Self-pay | Admitting: Family Medicine

## 2015-01-25 ENCOUNTER — Ambulatory Visit (INDEPENDENT_AMBULATORY_CARE_PROVIDER_SITE_OTHER): Payer: No Typology Code available for payment source | Admitting: Family Medicine

## 2015-01-25 VITALS — BP 121/82 | HR 66 | Ht 66.0 in | Wt 262.0 lb

## 2015-01-25 DIAGNOSIS — M1711 Unilateral primary osteoarthritis, right knee: Secondary | ICD-10-CM

## 2015-01-25 MED ORDER — SODIUM HYALURONATE (VISCOSUP) 25 MG/2.5ML IX SOSY
2.5000 mL | PREFILLED_SYRINGE | Freq: Once | INTRA_ARTICULAR | Status: AC
  Administered 2015-01-25: 2.5 mL via INTRA_ARTICULAR

## 2015-01-25 NOTE — Assessment & Plan Note (Signed)
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  She does have a small lateral meniscus tear but given degree of her arthritis her pain is more likely due to the DJD.  Intraarticular cortisone injection without much benefit - third supartz injection given today.  F/u in 4 weeks.  After informed written consent, patient was lying supine on exam table. Right knee was prepped with alcohol swab and utilizing superolateral approach under ultrasound guidance, patient's right knee was injected intraarticularly with 39mL marcaine followed by Jacklyn Shell. Patient tolerated the procedure well without immediate complications.

## 2015-01-25 NOTE — Addendum Note (Signed)
Addended by: Sherrie George F on: 01/25/2015 02:03 PM   Modules accepted: Orders

## 2015-01-25 NOTE — Progress Notes (Signed)
PCP: Osa Craver, MD  Subjective:   HPI: Patient is a 55 y.o. female here for bilateral knee pain.  8/29: Patient reports she's had 3 years of bilateral knee pain. Started mainly after working on concrete floors standing for 7 1/2 hours a day 7 days a week. No acute injury or trauma. Difficulty bending, kneeling, getting up after prolonged sitting. Difficulty walking for exercise. Some catching. Remote history of partial meniscectomy of right knee. No locking, giving out.  9/29: Patient returns with 7/10 bilateral knee pain. Achy and constant pain. Diffusely anterior. Problems bending kneeling, after prolonged driving and sitting. No locking, giving out.  12/6: Patient returns for repeat cortisone injection for right knee.  12/29: Patient returns to start [place] series.  01/18/15: Patient returns for second supartz injection. Pain level 5/10.  01/25/15: Patient returns for third supartz injection. Pain improved - 0/10 currently - comes and goes now.  Past Medical History  Diagnosis Date  . Tenosynovitis of foot and ankle 09/28/2013  . Hypertension   . Breast cancer [facility])     Current Outpatient Prescriptions on File Prior to Visit  Medication Sig Dispense Refill  . busPIRone (BUSPAR) 7.5 MG tablet Take 1 tablet (7.5 mg total) by mouth 2 (two) times daily. 60 tablet 5  . cyclobenzaprine (FLEXERIL) 5 MG tablet Take 1 tablet (5 mg total) by mouth at bedtime. 30 tablet 1  . FLUoxetine (PROZAC) 40 MG capsule Take 1 capsule (40 mg total) by mouth daily. 30 capsule 11  . lisinopril-hydrochlorothiazide (PRINZIDE,ZESTORETIC) 20-12.5 MG tablet Take 1 tablet by mouth daily. 30 tablet 11  . nabumetone (RELAFEN) 500 MG tablet Take 500 mg by mouth.    . oxyCODONE-acetaminophen (ROXICET) 5-325 MG tablet Take 1 tablet by mouth every 6 (six) hours as needed for severe pain. 60 tablet 0  . pravastatin (PRAVACHOL) 40 MG tablet Take 1 tablet (40 mg total) by mouth daily. 30 tablet  11  . zolpidem (AMBIEN) 5 MG tablet Take 1 tablet (5 mg total) by mouth at bedtime as needed for sleep. 30 tablet 5   No current facility-administered medications on file prior to visit.    Past Surgical History  Procedure Laterality Date  . Bunionectomy Left 01/26/2014    @ [facility]  . Cotton osteotomy w/ bone graft Left 01/26/2014    @ [place]  . Neck surgery    . Cholecystectomy    . Breast lumpectomy    . Breast reconstruction    . Knee surgery    . Abdominal hysterectomy      Allergies  Allergen Reactions  . Pineapple     Social History   Social History  . Marital Status: Single    Spouse Name: N/A  . Number of Children: N/A  . Years of Education: N/A   Occupational History  . Not on file.   Social History Main Topics  . Smoking status: Never Smoker   . Smokeless tobacco: Never Used  . Alcohol Use: 0.0 oz/week    0 Standard drinks or equivalent per week     Comment: Social  . Drug Use: No  . Sexual Activity: Not on file   Other Topics Concern  . Not on file   Social History Narrative    No family history on file.  BP 121/82 mmHg  Pulse 66  Ht 5\' 6"  (1.676 m)  Wt 262 lb (118.842 kg)  BMI 42.31 kg/m2  Review of Systems: See HPI above.    Objective:  Physical Exam:  Gen: NAD Exam not repeated today. Bilateral knees: No gross deformity, ecchymoses, effusions. TTP mainly medial joint lines but also post patellar facets, lateral joint lines bilaterally. FROM. Negative ant/post drawers. Negative valgus/varus testing. Negative lachmanns. Negative mcmurrays, apleys. NV intact distally.    Assessment & Plan:  1. 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  She does have a small lateral meniscus tear but given degree of her arthritis her pain is more likely due to the DJD.  Intraarticular cortisone injection without much benefit - third supartz injection given today.  F/u in 4 weeks.  After informed written  consent, patient was lying supine on exam table. Right knee was prepped with alcohol swab and utilizing superolateral approach under ultrasound guidance, patient's right knee was injected intraarticularly with 84mL marcaine followed by Jacklyn Shell. Patient tolerated the procedure well without immediate complications.

## 2015-01-29 ENCOUNTER — Ambulatory Visit: Payer: No Typology Code available for payment source | Admitting: Occupational Therapy

## 2015-01-29 ENCOUNTER — Encounter: Payer: Self-pay | Admitting: Occupational Therapy

## 2015-01-29 DIAGNOSIS — M25641 Stiffness of right hand, not elsewhere classified: Secondary | ICD-10-CM

## 2015-01-29 DIAGNOSIS — M79641 Pain in right hand: Secondary | ICD-10-CM

## 2015-01-29 DIAGNOSIS — R201 Hypoesthesia of skin: Secondary | ICD-10-CM

## 2015-01-29 NOTE — Therapy (Signed)
[facility] [address] [place], [place], [address] Phone: [phone]   Fax:  [phone]  Occupational Therapy Treatment  Patient Details  Name: Maria Sims MRN: [id number] Date of Birth: [birth year] Referring Provider: Dr. Blase Mess  Encounter Date: 01/29/2015      OT End of Session - 01/29/15 1433    Visit Number 3   Number of Visits 16   Date for OT Re-Evaluation 03/09/15   Authorization Type [employer] discount   OT Start Time 1235   OT Stop Time 1315   OT Time Calculation (min) 40 min   Activity Tolerance Patient tolerated treatment well   Behavior During Therapy [facility] for tasks assessed/performed      Past Medical History  Diagnosis Date  . Tenosynovitis of foot and ankle 09/28/2013  . Hypertension   . Breast cancer [facility])     Past Surgical History  Procedure Laterality Date  . Bunionectomy Left 01/26/2014    @ [facility]  . Cotton osteotomy w/ bone graft Left 01/26/2014    @ [place]  . Neck surgery    . Cholecystectomy    . Breast lumpectomy    . Breast reconstruction    . Knee surgery    . Abdominal hysterectomy      There were no vitals filed for this visit.  Visit Diagnosis:  Pain of right hand  Impaired sensation  Stiffness of joint, hand, right      Subjective Assessment - 01/29/15 1253    Subjective  Patient indicates the brace is a relief after exercising her right hand   Pertinent History see epic; pt with R hand pain, h/o of carpal tunnel however pain not consistent with carpal tunnel. ? de quervains per MD. Pt has a brace that she wears "all the time" altho she did not have it on today.   Patient Stated Goals I am hoping therapy can ease some of the pain.    Currently in Pain? Yes   Pain Score 7    Pain Location Hand  thumb and forearm   Pain Orientation Right   Pain Descriptors / Indicators Aching;Burning;Sharp   Pain Type Chronic pain   Pain Onset More than a  month ago   Pain Frequency Constant   Aggravating Factors  use   Pain Relieving Factors heat                      OT Treatments/Exercises (OP) - 01/29/15 0001    ADLs   Overall ADLs --   Cooking Patient reports increased pain when using her hand for cooking, e.g. chopping an onion.  Discussed that chopping provided resistance to right hand, in a repetitive manner and it might be best to reduce this activity for now, until pain subsides.     Writing Patient experiencing increase pain with prolonged typing.  Discussed with patient the benefit of using hand without increasing pain symptoms.  Discussed how repetitive motion mayincrease pain, and she may need to limit time typing initially, and gradually build up this skill.    Leisure Patient indicates pain in right thumb when playing games on her phone with her brace on.  Discussed that rapid thumb movement was not warranted at this time, and thumb movement was restricted in brace.  Patient needs to remove brace for longer phone use, texting, etc - when intending to use right thumb.   ADL Comments Patient does not consistently take pain medication as  she reports a feeling of drowsiness.  Patient does not take antiinflammatory medication at this time.  Discussed that patient could speak with MD to determine if there was a safe antiinflammatory medication that would not lead to drowsiness / lethargy.     Wrist Exercises   Wrist Flexion AROM;10 reps   Wrist Extension AROM;10 reps   Wrist Radial Deviation AROM;10 reps   Wrist Ulnar Deviation AROM;10 reps   Fine Motor Coordination   In Hand Manipulation Training Patient wth significant improvement to AROM into flexion in all digits - such that inhand manipulation without resistance was possible today.     RUE Fluidotherapy   Number Minutes Fluidotherapy 10 Minutes   RUE Fluidotherapy Location Hand;Wrist;Forearm   Comments reports heat is helpful   Manual Therapy   Manual Therapy Edema  management   Manual therapy comments Edema present wrist to forearm, reponded well to edema massage.     Edema Management Patient reports 4/10 pain at end of session                OT Education - 01/29/15 1410    Education provided Yes   Education Details Reviewed all HEP, Benefit of ice for pain management following activity, activity modification, brace schedule   Person(s) Educated Patient   Methods Explanation;Demonstration   Comprehension Verbalized understanding;Returned demonstration          OT Short Term Goals - 01/29/15 1434    OT SHORT TERM GOAL #1   Title Pt will be mod I with HEP - 02/09/2015   Status On-going   OT SHORT TERM GOAL #2   Title Pt will rate pain no more than 7/10 in R hand with simple activity (baseline = 9/10)   Status On-going   OT SHORT TERM GOAL #3   Title Pt will demonstrate improved coordinaton as evidenced by decreasing time on  9 hole peg test by at least 3 seconds (baseline= 32.05)   Status On-going   OT SHORT TERM GOAL #4   Title Pt will demonstrate 100% composite flexion in preparation for improved functional use of R hand   Status On-going   OT SHORT TERM GOAL #5   Title Pt will be indepenent in edema mgmt techniques   Status On-going   OT SHORT TERM GOAL #6   Title Pt will improve grip strength by at least 5 pounds to assist with functional tasks.(baseline = 10 pounds)   Status On-going           OT Long Term Goals - 01/29/15 1434    OT LONG TERM GOAL #1   Title Pt will be mod I with HEP - 03/09/2015   Status On-going   OT LONG TERM GOAL #2   Title Pt will rate pain no greater than 3/10 in R hand with functional use.   Status On-going   OT LONG TERM GOAL #3   Title Pt will demonstrate improved grip strength by at least 10 pounds to assist with functional tasks (baseline = 10 pounds)   Status On-going   OT LONG TERM GOAL #4   Title Pt will be able to use R hand as dominant for basic self care tasks   Status On-going    OT LONG TERM GOAL #5   Title Pt will demonstrate improved coordination as evidenced by decreasing time on 9 hole peg test by at least 5 seconds.    Status On-going  Plan - 01/29/15 1433    Clinical Impression Statement Patient is showing improved active range of motion, while still reporting significant pain, numbness in right hand   Pt will benefit from skilled therapeutic intervention in order to improve on the following deficits (Retired) Decreased coordination;Decreased range of motion;Decreased strength;Increased edema;Impaired UE functional use;Impaired sensation;Impaired perceived functional ability;Pain   Rehab Potential Fair   OT Frequency 2x / week   OT Duration 8 weeks   OT Treatment/Interventions Self-care/ADL training;Electrical Stimulation;Moist Heat;Fluidtherapy;Iontophoresis;Ultrasound;Therapeutic exercise;DME and/or AE instruction;Passive range of motion;Manual Therapy;Splinting;Therapeutic activities;Patient/family education   Plan Fluido/ice, gentle motion during function   Consulted and Agree with Plan of Care Patient        Problem List Patient Active Problem List   Diagnosis Date Noted  . Osteoarthritis of right knee 01/03/2015  . Myopia of both eyes 11/29/2014  . Right hand pain 11/06/2014  . Hypercholesteremia 10/11/2014  . Essential hypertension 10/10/2014  . Healthcare maintenance 10/10/2014  . Prediabetes 10/10/2014  . Depression, major, recurrent, moderate ([place]) 06/08/2014  . Equinus deformity of foot, acquired 08/24/2013    Mariah Milling, OTR/L 01/29/2015, 2:36 PM  [facility] [address] [place], [place], [address] Phone: [phone]   Fax:  [phone]  Name: Maria Sims MRN: [id number] Date of Birth: [birth year]

## 2015-01-31 ENCOUNTER — Encounter: Payer: Self-pay | Admitting: Occupational Therapy

## 2015-01-31 ENCOUNTER — Ambulatory Visit: Payer: No Typology Code available for payment source | Admitting: Occupational Therapy

## 2015-01-31 DIAGNOSIS — R29898 Other symptoms and signs involving the musculoskeletal system: Secondary | ICD-10-CM

## 2015-01-31 DIAGNOSIS — M79641 Pain in right hand: Secondary | ICD-10-CM

## 2015-01-31 DIAGNOSIS — M25641 Stiffness of right hand, not elsewhere classified: Secondary | ICD-10-CM

## 2015-01-31 NOTE — Therapy (Signed)
[facility] [address] [place], [place], [address] Phone: [phone]   Fax:  [phone]  Occupational Therapy Treatment  Patient Details  Name: Maria Sims MRN: [id number] Date of Birth: 1960-12-05 Referring Provider: Dr. Blase Mess  Encounter Date: 01/31/2015      OT End of Session - 01/31/15 1316    Visit Number 4   Number of Visits 16   Date for OT Re-Evaluation 03/09/15   Authorization Type [employer] discount   OT Start Time 1105   OT Stop Time 1150   OT Time Calculation (min) 45 min   Activity Tolerance Patient tolerated treatment well      Past Medical History  Diagnosis Date  . Tenosynovitis of foot and ankle 09/28/2013  . Hypertension   . Breast cancer [facility])     Past Surgical History  Procedure Laterality Date  . Bunionectomy Left 01/26/2014    @ [facility]  . Cotton osteotomy w/ bone graft Left 01/26/2014    @ [place]  . Neck surgery    . Cholecystectomy    . Breast lumpectomy    . Breast reconstruction    . Knee surgery    . Abdominal hysterectomy      There were no vitals filed for this visit.  Visit Diagnosis:  Pain of right hand  Decreased grip strength of right hand  Stiffness of joint, hand, right      Subjective Assessment - 01/31/15 1110    Subjective  I had a sharp shooting pain in my Rt foot (middle two toes) for about 5 minutes, no other symptoms, then it went away.    Pertinent History see epic; pt with R hand pain, h/o of carpal tunnel however pain not consistent with carpal tunnel. ? de quervains per MD. Pt has a brace that she wears "all the time" altho she did not have it on today.   Patient Stated Goals I am hoping therapy can ease some of the pain.    Currently in Pain? Yes   Pain Score 8    Pain Location Hand   Pain Orientation Right   Pain Descriptors / Indicators Aching;Burning;Sharp   Pain Type Chronic pain   Pain Onset More than a month ago   Pain Frequency Constant   Aggravating Factors  use   Pain Relieving Factors heat                      OT Treatments/Exercises (OP) - 01/31/15 0001    ADLs   Writing Practiced writing name with 100% legibility but increased comfort when using built up pen. Pt issued tan and red foam for writing and eating utensils prn.    ADL Comments Discussed ice massage for pain management and swelling along thumb extensor tendon musculature for 8 minutes after strenuous activity or ice pack x 15 minutes. Long discussion re: A/E recommendations and/or compensatory strategies and task modifications for painful activites including tasks for cutting vegetables, holding pots/pans, opening jars, wringing out washclothes, turning keys, holding steering wheel, vacuuming, etc.    Hand Exercises   Other Hand Exercises Reviewed importance of doing wrist and thumb ROM exercises seperately at this time to avoid further pain - pt verbalized understanding   RUE Fluidotherapy   Number Minutes Fluidotherapy 12 Minutes   RUE Fluidotherapy Location Hand;Wrist;Forearm   Comments at beginning of session for pain management  OT Education - 01/31/15 1314    Education provided Yes   Education Details Ice massage, A/E recommendations and task modifications   Person(s) Educated Patient   Methods Explanation   Comprehension Verbalized understanding          OT Short Term Goals - 01/29/15 1434    OT SHORT TERM GOAL #1   Title Pt will be mod I with HEP - 02/09/2015   Status On-going   OT SHORT TERM GOAL #2   Title Pt will rate pain no more than 7/10 in R hand with simple activity (baseline = 9/10)   Status On-going   OT SHORT TERM GOAL #3   Title Pt will demonstrate improved coordinaton as evidenced by decreasing time on  9 hole peg test by at least 3 seconds (baseline= 32.05)   Status On-going   OT SHORT TERM GOAL #4   Title Pt will demonstrate 100% composite flexion in preparation  for improved functional use of R hand   Status On-going   OT SHORT TERM GOAL #5   Title Pt will be indepenent in edema mgmt techniques   Status On-going   OT SHORT TERM GOAL #6   Title Pt will improve grip strength by at least 5 pounds to assist with functional tasks.(baseline = 10 pounds)   Status On-going           OT Long Term Goals - 01/29/15 1434    OT LONG TERM GOAL #1   Title Pt will be mod I with HEP - 03/09/2015   Status On-going   OT LONG TERM GOAL #2   Title Pt will rate pain no greater than 3/10 in R hand with functional use.   Status On-going   OT LONG TERM GOAL #3   Title Pt will demonstrate improved grip strength by at least 10 pounds to assist with functional tasks (baseline = 10 pounds)   Status On-going   OT LONG TERM GOAL #4   Title Pt will be able to use R hand as dominant for basic self care tasks   Status On-going   OT LONG TERM GOAL #5   Title Pt will demonstrate improved coordination as evidenced by decreasing time on 9 hole peg test by at least 5 seconds.    Status On-going               Plan - 01/31/15 1320    Clinical Impression Statement Pt is showing improved A/ROM, but still limited by pain.    Plan continue modalities, ROM, task modifications   Consulted and Agree with Plan of Care Patient        Problem List Patient Active Problem List   Diagnosis Date Noted  . Osteoarthritis of right knee 01/03/2015  . Myopia of both eyes 11/29/2014  . Right hand pain 11/06/2014  . Hypercholesteremia 10/11/2014  . Essential hypertension 10/10/2014  . Healthcare maintenance 10/10/2014  . Prediabetes 10/10/2014  . Depression, major, recurrent, moderate ([place]) 06/08/2014  . Equinus deformity of foot, acquired 08/24/2013    [name], OTR/L 01/31/2015, 1:22 PM  [facility] [address] [place], [place], [address] Phone: [phone]   Fax:  [phone]  Name:  Maria Sims MRN: [id number] Date of Birth: [birth year]

## 2015-02-01 ENCOUNTER — Other Ambulatory Visit (INDEPENDENT_AMBULATORY_CARE_PROVIDER_SITE_OTHER): Payer: No Typology Code available for payment source

## 2015-02-01 DIAGNOSIS — Z0000 Encounter for general adult medical examination without abnormal findings: Secondary | ICD-10-CM

## 2015-02-01 DIAGNOSIS — Z1211 Encounter for screening for malignant neoplasm of colon: Secondary | ICD-10-CM

## 2015-02-01 LAB — POC HEMOCCULT BLD/STL (HOME/3-CARD/SCREEN)
FECAL OCCULT BLD: NEGATIVE
FECAL OCCULT BLD: NEGATIVE
Fecal Occult Blood, POC: NEGATIVE

## 2015-02-05 ENCOUNTER — Encounter: Payer: Self-pay | Admitting: Occupational Therapy

## 2015-02-05 ENCOUNTER — Ambulatory Visit: Payer: No Typology Code available for payment source | Admitting: Occupational Therapy

## 2015-02-05 DIAGNOSIS — M79641 Pain in right hand: Secondary | ICD-10-CM

## 2015-02-05 NOTE — Therapy (Signed)
[facility] [address] [place], [place], [address] Phone: [phone]   Fax:  [phone]  Occupational Therapy Treatment  Patient Details  Name: Maria Sims MRN: [id number] Date of Birth: 1960/02/14 Referring Provider: Dr. Blase Mess  Encounter Date: 02/05/2015      OT End of Session - 02/05/15 1141    Visit Number 5   Number of Visits 16   Date for OT Re-Evaluation 03/09/15   Authorization Type [employer] discount   OT Start Time 1100   OT Stop Time 1145   OT Time Calculation (min) 45 min   Activity Tolerance Patient tolerated treatment well      Past Medical History  Diagnosis Date  . Tenosynovitis of foot and ankle 09/28/2013  . Hypertension   . Breast cancer [facility])     Past Surgical History  Procedure Laterality Date  . Bunionectomy Left 01/26/2014    @ [facility]  . Cotton osteotomy w/ bone graft Left 01/26/2014    @ [place]  . Neck surgery    . Cholecystectomy    . Breast lumpectomy    . Breast reconstruction    . Knee surgery    . Abdominal hysterectomy      There were no vitals filed for this visit.  Visit Diagnosis:  Pain of right hand      Subjective Assessment - 02/05/15 1106    Subjective  This weekend the pain in my Rt hand and thumb was really bad 10/10, but I think I overdid it. I drove a lot and tried to wean from the brace maybe a little too much   Pertinent History see epic; pt with R hand pain, h/o of carpal tunnel however pain not consistent with carpal tunnel. ? de quervains per MD. Pt has a brace that she wears "all the time" altho she did not have it on today.   Patient Stated Goals I am hoping therapy can ease some of the pain.    Currently in Pain? Yes   Pain Score 8    Pain Location Hand  along thumb    Pain Orientation Right   Pain Descriptors / Indicators Aching;Sharp   Pain Type Chronic pain   Pain Frequency Constant   Aggravating Factors  use   Pain  Relieving Factors heat, ice, brace                      OT Treatments/Exercises (OP) - 02/05/15 0001    Modalities   Modalities Cryotherapy   Cryotherapy   Number Minutes Cryotherapy 10 Minutes   Cryotherapy Location Hand;Wrist;Forearm   Type of Cryotherapy Ice pack  at end of session (over kinesiotape)    RUE Fluidotherapy   Number Minutes Fluidotherapy 12 Minutes   RUE Fluidotherapy Location Hand;Wrist   Manual Therapy   Manual Therapy Myofascial release;Soft tissue mobilization;Taping   Soft tissue mobilization followed by soft tissue mobs along extensor musculature with noted tenderness upon palpation   Myofascial Release along extensor musculature prior to soft tissue mobs   Kinesiotex Create Space;Inhibit Muscle                  OT Short Term Goals - 01/29/15 1434    OT SHORT TERM GOAL #1   Title Pt will be mod I with HEP - 02/09/2015   Status On-going   OT SHORT TERM GOAL #2   Title Pt will rate pain no more than 7/10 in R  hand with simple activity (baseline = 9/10)   Status On-going   OT SHORT TERM GOAL #3   Title Pt will demonstrate improved coordinaton as evidenced by decreasing time on  9 hole peg test by at least 3 seconds (baseline= 32.05)   Status On-going   OT SHORT TERM GOAL #4   Title Pt will demonstrate 100% composite flexion in preparation for improved functional use of R hand   Status On-going   OT SHORT TERM GOAL #5   Title Pt will be indepenent in edema mgmt techniques   Status On-going   OT SHORT TERM GOAL #6   Title Pt will improve grip strength by at least 5 pounds to assist with functional tasks.(baseline = 10 pounds)   Status On-going           OT Long Term Goals - 01/29/15 1434    OT LONG TERM GOAL #1   Title Pt will be mod I with HEP - 03/09/2015   Status On-going   OT LONG TERM GOAL #2   Title Pt will rate pain no greater than 3/10 in R hand with functional use.   Status On-going   OT LONG TERM GOAL #3   Title  Pt will demonstrate improved grip strength by at least 10 pounds to assist with functional tasks (baseline = 10 pounds)   Status On-going   OT LONG TERM GOAL #4   Title Pt will be able to use R hand as dominant for basic self care tasks   Status On-going   OT LONG TERM GOAL #5   Title Pt will demonstrate improved coordination as evidenced by decreasing time on 9 hole peg test by at least 5 seconds.    Status On-going               Plan - 02/05/15 1141    Clinical Impression Statement Pt with increased pain this weekend with pt reports of overdoing it.    Plan continue modalities, assess kinesiotape, progress as able   Consulted and Agree with Plan of Care Patient        Problem List Patient Active Problem List   Diagnosis Date Noted  . Osteoarthritis of right knee 01/03/2015  . Myopia of both eyes 11/29/2014  . Right hand pain 11/06/2014  . Hypercholesteremia 10/11/2014  . Essential hypertension 10/10/2014  . Healthcare maintenance 10/10/2014  . Prediabetes 10/10/2014  . Depression, major, recurrent, moderate ([place]) 06/08/2014  . Equinus deformity of foot, acquired 08/24/2013    [name], OTR/L 02/05/2015, 11:44 AM  [facility] [address] [place], [place], [address] Phone: [phone]   Fax:  [phone]  Name: Maria Sims MRN: [id number] Date of Birth: 1960-11-12

## 2015-02-07 ENCOUNTER — Encounter: Payer: Self-pay | Admitting: Occupational Therapy

## 2015-02-07 ENCOUNTER — Telehealth: Payer: Self-pay | Admitting: Internal Medicine

## 2015-02-07 ENCOUNTER — Ambulatory Visit: Payer: No Typology Code available for payment source | Admitting: Occupational Therapy

## 2015-02-07 ENCOUNTER — Telehealth: Payer: Self-pay | Admitting: Occupational Therapy

## 2015-02-07 DIAGNOSIS — M79641 Pain in right hand: Secondary | ICD-10-CM

## 2015-02-07 DIAGNOSIS — M25641 Stiffness of right hand, not elsewhere classified: Secondary | ICD-10-CM

## 2015-02-07 NOTE — Telephone Encounter (Signed)
Pt states the anxiety med is too expensive, unable to get it. Please call pt back.

## 2015-02-07 NOTE — Telephone Encounter (Signed)
If you agree, please sign to below plan in occupational therapy for pain management and then close encounter:   By signing I understand that I am ordering/authorizing the use of Iontophoresis using 4 mg/mL of dexamethasone as a component of this plan of care.

## 2015-02-07 NOTE — Therapy (Signed)
[facility] [address] [place], [place], [address] Phone: [phone]   Fax:  [phone]  Occupational Therapy Treatment  Patient Details  Name: Maria Sims MRN: [id number] Date of Birth: 07-13-1960 Referring Provider: Dr. Blase Mess  Encounter Date: 02/07/2015      OT End of Session - 02/07/15 1345    Visit Number 6   Number of Visits 16   Date for OT Re-Evaluation 03/09/15   Authorization Type [employer] discount   OT Start Time 1100  first 10 min. hot pack, last 10 min. ice pack   OT Stop Time 1155   OT Time Calculation (min) 55 min   Activity Tolerance Patient tolerated treatment well      Past Medical History  Diagnosis Date  . Tenosynovitis of foot and ankle 09/28/2013  . Hypertension   . Breast cancer [facility])     Past Surgical History  Procedure Laterality Date  . Bunionectomy Left 01/26/2014    @ [facility]  . Cotton osteotomy w/ bone graft Left 01/26/2014    @ [place]  . Neck surgery    . Cholecystectomy    . Breast lumpectomy    . Breast reconstruction    . Knee surgery    . Abdominal hysterectomy      There were no vitals filed for this visit.  Visit Diagnosis:  Pain of right hand  Stiffness of joint, hand, right      Subjective Assessment - 02/07/15 1100    Subjective  The kinesiotape helped 50%. The hot water helps the pain   Pertinent History see epic; pt with R hand pain, h/o of carpal tunnel however pain not consistent with carpal tunnel. ? de quervains per MD.    Patient Stated Goals I am hoping therapy can ease some of the pain.    Currently in Pain? Yes   Pain Score 6    Pain Location Finger (Comment which one)  Thumb and index finger   Pain Orientation Right   Pain Descriptors / Indicators Burning   Pain Type Chronic pain   Pain Onset More than a month ago   Pain Frequency Constant   Aggravating Factors  use   Pain Relieving Factors heat, ice, brace                       OT Treatments/Exercises (OP) - 02/07/15 0001    Wrist Exercises   Other wrist exercises wrist flex/ext with thumb relaxed x 10 reps each   Hand Exercises   Other Hand Exercises thumb flex and radial abd x 10 reps each with wrist neutral   Modalities   Modalities Moist Heat   Moist Heat Therapy   Number Minutes Moist Heat 10 Minutes  did instead of fluido d/t tape still on   Moist Heat Location --  radial forearm/thumb at beginning of session   Cryotherapy   Number Minutes Cryotherapy 10 Minutes   Cryotherapy Location Hand;Wrist;Forearm   Type of Cryotherapy Ice pack  at end of session (over kinesiotape)   Manual Therapy   Soft tissue mobilization along radial and dorsal wrist/forearm over taping and around taping.                   OT Short Term Goals - 01/29/15 1434    OT SHORT TERM GOAL #1   Title Pt will be mod I with HEP - 02/09/2015   Status On-going   OT SHORT  TERM GOAL #2   Title Pt will rate pain no more than 7/10 in R hand with simple activity (baseline = 9/10)   Status On-going   OT SHORT TERM GOAL #3   Title Pt will demonstrate improved coordinaton as evidenced by decreasing time on  9 hole peg test by at least 3 seconds (baseline= 32.05)   Status On-going   OT SHORT TERM GOAL #4   Title Pt will demonstrate 100% composite flexion in preparation for improved functional use of R hand   Status On-going   OT SHORT TERM GOAL #5   Title Pt will be indepenent in edema mgmt techniques   Status On-going   OT SHORT TERM GOAL #6   Title Pt will improve grip strength by at least 5 pounds to assist with functional tasks.(baseline = 10 pounds)   Status On-going           OT Long Term Goals - 01/29/15 1434    OT LONG TERM GOAL #1   Title Pt will be mod I with HEP - 03/09/2015   Status On-going   OT LONG TERM GOAL #2   Title Pt will rate pain no greater than 3/10 in R hand with functional use.   Status On-going   OT LONG  TERM GOAL #3   Title Pt will demonstrate improved grip strength by at least 10 pounds to assist with functional tasks (baseline = 10 pounds)   Status On-going   OT LONG TERM GOAL #4   Title Pt will be able to use R hand as dominant for basic self care tasks   Status On-going   OT LONG TERM GOAL #5   Title Pt will demonstrate improved coordination as evidenced by decreasing time on 9 hole peg test by at least 5 seconds.    Status On-going               Plan - 02/07/15 1347    Clinical Impression Statement Pt with no changes in pain. Will request iontophoresis order from referring MD.    Plan Assess STG's, iontophoresis if order in, if not - ultrasound, manual therapy, taping and ice   Consulted and Agree with Plan of Care Patient        Problem List Patient Active Problem List   Diagnosis Date Noted  . Osteoarthritis of right knee 01/03/2015  . Myopia of both eyes 11/29/2014  . Right hand pain 11/06/2014  . Hypercholesteremia 10/11/2014  . Essential hypertension 10/10/2014  . Healthcare maintenance 10/10/2014  . Prediabetes 10/10/2014  . Depression, major, recurrent, moderate ([place]) 06/08/2014  . Equinus deformity of foot, acquired 08/24/2013    [name], OTR/L 02/07/2015, 1:49 PM  [facility] [address] [place], [place], [address] Phone: [phone]   Fax:  [phone]  Name: Maria Sims MRN: [id number] Date of Birth: 1960-05-30

## 2015-02-08 ENCOUNTER — Encounter: Payer: Self-pay | Admitting: Internal Medicine

## 2015-02-08 ENCOUNTER — Ambulatory Visit (INDEPENDENT_AMBULATORY_CARE_PROVIDER_SITE_OTHER): Payer: No Typology Code available for payment source | Admitting: Internal Medicine

## 2015-02-08 VITALS — BP 130/81 | HR 70 | Temp 98.4°F | Ht 66.0 in | Wt 269.3 lb

## 2015-02-08 DIAGNOSIS — G5760 Lesion of plantar nerve, unspecified lower limb: Secondary | ICD-10-CM | POA: Insufficient documentation

## 2015-02-08 DIAGNOSIS — R7303 Prediabetes: Secondary | ICD-10-CM

## 2015-02-08 DIAGNOSIS — M2012 Hallux valgus (acquired), left foot: Secondary | ICD-10-CM

## 2015-02-08 DIAGNOSIS — M2011 Hallux valgus (acquired), right foot: Secondary | ICD-10-CM

## 2015-02-08 DIAGNOSIS — G5761 Lesion of plantar nerve, right lower limb: Secondary | ICD-10-CM

## 2015-02-08 DIAGNOSIS — Z6841 Body Mass Index (BMI) 40.0 and over, adult: Secondary | ICD-10-CM

## 2015-02-08 NOTE — Assessment & Plan Note (Signed)
A/P: Morton neuroma. Patient has typical story and physical exam. Will try 1 month of conservative management.  Can consider steroid injections on follow up if not improved. - Wide toe-box shoes - Ibuprofen/Tylenol PRN. - RTC 1 month

## 2015-02-08 NOTE — Telephone Encounter (Signed)
Attempted to return patient's phone call, but went straight to voicemail twice stating the person could not accept phone calls at this time.   Buspirone 10 mg BID #60 should only be $4 at [employer].

## 2015-02-08 NOTE — Telephone Encounter (Signed)
I authorize the use of Iontophoresis with dexamethasone 4mg /mL in the treatment of this patient.

## 2015-02-08 NOTE — Patient Instructions (Signed)
1. Use WIDE, WIDE, WIDE toe-box shoes. 2. You may use Ibuprofen and Tylenol as needed for pain.  Morton Neuralgia Morton neuralgia is a type of foot pain in the area closest to your toes. This area is sometimes called the ball of your foot. Morton neuralgia occurs when a branch of a nerve in your foot (digital nerve) becomes compressed.  When this happens over a long period of time, the nerve can thicken (neuroma) and cause pain. This usually occurs between the third and fourth toe. Morton neuralgia can come and go but may get worse over time.  CAUSES Your digital nerve can become compressed and stretched at a point where it passes under a thick band of tissue that connects your toes (intermetatarsal ligament). Morton neuralgia can be caused by mild repetitive damage in this area. This type of damage can result from:   Activities such as running or jumping.  Wearing shoes that are too tight. RISK FACTORS You may be at risk for Morton neuralgia if you:  Are female.  Wear high heels.  Wear shoes that are narrow or tight.  Participate in activities that stretch your toes. These include:  Running.  Ballet.  Long-distance walking. SIGNS AND SYMPTOMS The first symptom of Morton neuralgia is pain that spreads from the ball of your foot to your toes. It may feel like you are walking on a marble. Pain usually gets worse with walking and goes away at night. Other symptoms may include numbness and cramping of your toes. DIAGNOSIS  Your health care provider will do a physical exam. When doing the exam, your health care provider may:   Squeeze your foot just behind your toe.  Ask you to move your toes to check for pain. You may also have tests on your foot to confirm the diagnosis. These may include:   An X-ray.  An MRI. TREATMENT  Treatment for Morton neuralgia may be as simple as changing the kind of shoes you wear. Other treatments may include:  Wearing a supportive pad (orthosis)  under the front of your foot. This lifts your toe bones and takes pressure off the nerve.  Getting injections of numbing medicine and anti-inflammatory medicine (steroid) in the nerve.  Having surgery to remove part of the thickened nerve. HOME CARE INSTRUCTIONS   Take medicine only as directed by your health care provider.  Wear soft-soled shoes with a wide toe area.  Stop activities that may be causing pain.  Elevate your foot when resting.  Massage your foot.  Apply ice to the injured area:   Put ice in a plastic bag.  Place a towel between your skin and the bag.  Leave the ice on for 20 minutes, 2-3 times a day.   Keep all follow-up visits as directed by your health care provider. This is important. SEEK MEDICAL CARE IF:  Home care instructions are not helping you get better.  Your symptoms change or get worse.   This information is not intended to replace advice given to you by your health care provider. Make sure you discuss any questions you have with your health care provider.   Document Released: 04/07/2000 Document Revised: 01/20/2014 Document Reviewed: 03/02/2013 Elsevier Interactive Patient Education [employer].

## 2015-02-08 NOTE — Progress Notes (Signed)
Patient ID: Maria Sims, female   DOB: 03/12/1960, 55 y.o.   MRN: [id number]   Subjective:   Patient ID: Maria Sims female   DOB: 09/23/1960 55 y.o.   MRN: [id number]  HPI: [name] is a 55 y.o. female with PMH as below, here for evaluation of right foot pain.  Please see Problem-Based charting for the status of the patient's chronic medical issues.  She reports sudden onset of sharp, shooting, right foot pain that started Tuesday while driving.  She reports sharp pains, originating from the 2 web space and radiating to the toes and distal tibia.  The pain resolved on its own and recurred on Wednesday.  She has not had a repeat episode since.  She denies trauma, wound, or mass on the foot.  She walks 15-20 minutes 3 times weekly.  This has been consistent for the last 6 months.  She has had surgery of the left foot for equinovarus.  She endorses flat feet bilaterally.  She does not wear orthotics.   Past Medical History  Diagnosis Date  . Tenosynovitis of foot and ankle 09/28/2013  . Hypertension   . Breast cancer [facility])    Current Outpatient Prescriptions  Medication Sig Dispense Refill  . busPIRone (BUSPAR) 7.5 MG tablet Take 1 tablet (7.5 mg total) by mouth 2 (two) times daily. 60 tablet 5  . cyclobenzaprine (FLEXERIL) 5 MG tablet Take 1 tablet (5 mg total) by mouth at bedtime. 30 tablet 1  . FLUoxetine (PROZAC) 40 MG capsule Take 1 capsule (40 mg total) by mouth daily. 30 capsule 11  . lisinopril-hydrochlorothiazide (PRINZIDE,ZESTORETIC) 20-12.5 MG tablet Take 1 tablet by mouth daily. 30 tablet 11  . nabumetone (RELAFEN) 500 MG tablet Take 500 mg by mouth.    . oxyCODONE-acetaminophen (ROXICET) 5-325 MG tablet Take 1 tablet by mouth every 6 (six) hours as needed for severe pain. 60 tablet 0  . pravastatin (PRAVACHOL) 40 MG tablet Take 1 tablet (40 mg total) by mouth daily. 30 tablet 11  . zolpidem (AMBIEN) 5 MG tablet Take 1 tablet (5 mg total) by mouth at bedtime as  needed for sleep. 30 tablet 5   No current facility-administered medications for this visit.   No family history on file. Social History   Social History  . Marital Status: Single    Spouse Name: N/A  . Number of Children: N/A  . Years of Education: N/A   Social History Main Topics  . Smoking status: Never Smoker   . Smokeless tobacco: Never Used  . Alcohol Use: 0.0 oz/week    0 Standard drinks or equivalent per week     Comment: Social  . Drug Use: No  . Sexual Activity: Not Asked   Other Topics Concern  . None   Social History Narrative   Review of Systems: Pertinent items are noted in HPI. Objective:  Physical Exam: Filed Vitals:   02/08/15 0917  BP: 130/81  Pulse: 70  Temp: 98.4 F (36.9 C)  TempSrc: Oral  Height: 5\' 6"  (1.676 m)  Weight: 269 lb 4.8 oz (122.154 kg)  SpO2: 98%   Physical Exam  Constitutional:  Obese female, sitting in chair. NAD.  HENT:  Head: Normocephalic and atraumatic.  Eyes: EOM are normal. No scleral icterus.  Musculoskeletal:  Hallux valgus bilaterally.  Left foot with well healed scars from previous surgery.  Right foot pain reproduced with firm pressure on 2-3 metatarsals and deep palpation of 2nd web space.  No masses or wounds  appreciated.  Skin: Skin is warm and dry.     Assessment & Plan:   Patient and case were discussed with Dr. Evette Doffing.  Please refer to Problem Based charting for further documentation.

## 2015-02-08 NOTE — Assessment & Plan Note (Signed)
Patient has OA and prediabetes and is having trouble losing weight.  She is interested in speaking with dietician about weight loss and proper nutrition. - Nutrition referral.

## 2015-02-09 NOTE — Progress Notes (Signed)
Internal Medicine Clinic Attending  Case discussed with Dr. Taylor at the time of the visit.  We reviewed the resident's history and exam and pertinent patient test results.  I agree with the assessment, diagnosis, and plan of care documented in the resident's note. 

## 2015-02-12 ENCOUNTER — Ambulatory Visit: Payer: No Typology Code available for payment source | Admitting: Occupational Therapy

## 2015-02-12 DIAGNOSIS — R29898 Other symptoms and signs involving the musculoskeletal system: Secondary | ICD-10-CM

## 2015-02-12 DIAGNOSIS — M25641 Stiffness of right hand, not elsewhere classified: Secondary | ICD-10-CM

## 2015-02-12 DIAGNOSIS — M79641 Pain in right hand: Secondary | ICD-10-CM

## 2015-02-12 DIAGNOSIS — R201 Hypoesthesia of skin: Secondary | ICD-10-CM

## 2015-02-12 NOTE — Therapy (Signed)
[facility] [address] [place], [place], [address] Phone: [phone]   Fax:  [phone]  Occupational Therapy Treatment  Patient Details  Name: Maria Sims MRN: [id number] Date of Birth: [birth year] Referring Provider: Dr. Blase Mess  Encounter Date: 02/12/2015      OT End of Session - 02/12/15 1134    Visit Number 7   Number of Visits 16   Date for OT Re-Evaluation 03/09/15   Authorization Type [employer] discount   OT Start Time 1104   OT Stop Time 1148   OT Time Calculation (min) 44 min   Activity Tolerance Patient tolerated treatment well   Behavior During Therapy [facility] for tasks assessed/performed      Past Medical History  Diagnosis Date  . Tenosynovitis of foot and ankle 09/28/2013  . Hypertension   . Breast cancer [facility])     Past Surgical History  Procedure Laterality Date  . Bunionectomy Left 01/26/2014    @ [facility]  . Cotton osteotomy w/ bone graft Left 01/26/2014    @ [place]  . Neck surgery    . Cholecystectomy    . Breast lumpectomy    . Breast reconstruction    . Knee surgery    . Abdominal hysterectomy      There were no vitals filed for this visit.  Visit Diagnosis:  Pain of right hand  Stiffness of joint, hand, right  Decreased grip strength of right hand  Impaired sensation      Subjective Assessment - 02/12/15 1106    Subjective  "I had a bad weekend.  I tried to take the brace off so that I wasn't dependent on it."   Pertinent History see epic; pt with R hand pain, h/o of carpal tunnel however pain not consistent with carpal tunnel. ? de quervains per MD.    Patient Stated Goals I am hoping therapy can ease some of the pain.    Currently in Pain? Yes   Pain Score 10-Worst pain ever  up to 10/10   Pain Location --  hand   Pain Orientation Right   Pain Descriptors / Indicators Throbbing;Burning;Sharp   Pain Type Chronic pain   Pain Frequency Constant   Aggravating Factors  use   Pain Relieving Factors heat, ice, brace                      OT Treatments/Exercises (OP) - 02/12/15 0001    ADLs   ADL Comments Recommended pt continue wearing splint due to incr pain without it.  Reviewed activity modifications   Wrist Exercises   Other wrist exercises isolated UD and isolated thumb flex stretches   Modalities   Modalities Iontophoresis;Ultrasound   Ultrasound   Ultrasound Location radial forearm/wrist   Ultrasound Parameters 84mhz, 20% pulsed, 0.8wts/cm2 with no adverse reactions    Ultrasound Goals Pain  tightness, incr circulation   Iontophoresis   Type of Iontophoresis Dexamethasone   Location R palm/base of thumb   Dose intensity=2.0-2.1   Time 20 with no adverse reactions                OT Education - 02/12/15 1132    Education Details Iontophoresis precautions/Contraindications   Person(s) Educated Patient   Methods Explanation;Handout   Comprehension Verbalized understanding          OT Short Term Goals - 02/12/15 1144    OT SHORT TERM GOAL #1   Title Pt will be mod  I with HEP - 02/09/2015   Status On-going   OT SHORT TERM GOAL #2   Title Pt will rate pain no more than 7/10 in R hand with simple activity (baseline = 9/10)   Status On-going   OT SHORT TERM GOAL #3   Title Pt will demonstrate improved coordinaton as evidenced by decreasing time on  9 hole peg test by at least 3 seconds (baseline= 32.05)   Status On-going   OT SHORT TERM GOAL #4   Title Pt will demonstrate 100% composite flexion in preparation for improved functional use of R hand   Status On-going   OT SHORT TERM GOAL #5   Title Pt will be indepenent in edema mgmt techniques   Status On-going   OT SHORT TERM GOAL #6   Title Pt will improve grip strength by at least 5 pounds to assist with functional tasks.(baseline = 10 pounds)   Status On-going           OT Long Term Goals - 01/29/15 1434    OT LONG TERM GOAL #1    Title Pt will be mod I with HEP - 03/09/2015   Status On-going   OT LONG TERM GOAL #2   Title Pt will rate pain no greater than 3/10 in R hand with functional use.   Status On-going   OT LONG TERM GOAL #3   Title Pt will demonstrate improved grip strength by at least 10 pounds to assist with functional tasks (baseline = 10 pounds)   Status On-going   OT LONG TERM GOAL #4   Title Pt will be able to use R hand as dominant for basic self care tasks   Status On-going   OT LONG TERM GOAL #5   Title Pt will demonstrate improved coordination as evidenced by decreasing time on 9 hole peg test by at least 5 seconds.    Status On-going               Plan - 02/12/15 1135    Clinical Impression Statement Pt reports incr pain over the weekend when not wearing splint.  Emphasized importance of wearing splint.  Pt tolerated Iontophoresis well (#1).  Pt reports using activity modifications as previous instructed.   Plan check STGs, iontophoresis, manual therapy, ultrasound   Consulted and Agree with Plan of Care Patient        Problem List Patient Active Problem List   Diagnosis Date Noted  . Morton neuroma 02/08/2015  . Osteoarthritis of right knee 01/03/2015  . Myopia of both eyes 11/29/2014  . Right hand pain 11/06/2014  . Hypercholesteremia 10/11/2014  . Essential hypertension 10/10/2014  . Healthcare maintenance 10/10/2014  . Prediabetes 10/10/2014  . Depression, major, recurrent, moderate ([place]) 06/08/2014  . Equinus deformity of foot, acquired 08/24/2013    [facility] 02/12/2015, 11:45 AM  [facility] [address] [place], [place], [address] Phone: [phone]   Fax:  [phone]  Name: Maria Sims MRN: [id number] Date of Birth: 1960/03/31  Vianne Bulls, OTR/L 02/12/2015 11:45 AM

## 2015-02-13 NOTE — Telephone Encounter (Signed)
Have tried to call pt 2 more times, straight to vmail also

## 2015-02-14 ENCOUNTER — Encounter: Payer: Self-pay | Admitting: Occupational Therapy

## 2015-02-14 ENCOUNTER — Ambulatory Visit: Payer: No Typology Code available for payment source | Attending: Family Medicine | Admitting: Occupational Therapy

## 2015-02-14 DIAGNOSIS — M25641 Stiffness of right hand, not elsewhere classified: Secondary | ICD-10-CM

## 2015-02-14 DIAGNOSIS — R201 Hypoesthesia of skin: Secondary | ICD-10-CM | POA: Insufficient documentation

## 2015-02-14 DIAGNOSIS — M6281 Muscle weakness (generalized): Secondary | ICD-10-CM | POA: Insufficient documentation

## 2015-02-14 DIAGNOSIS — M79641 Pain in right hand: Secondary | ICD-10-CM | POA: Insufficient documentation

## 2015-02-14 NOTE — Therapy (Signed)
[facility] [address] [place], [place], [address] Phone: [phone]   Fax:  [phone]  Occupational Therapy Treatment  Patient Details  Name: Maria Sims MRN: [id number] Date of Birth: Feb 15, 1960 Referring Provider: Dr. Blase Mess  Encounter Date: 02/14/2015      OT End of Session - 02/14/15 1144    Visit Number 8   Number of Visits 16   Date for OT Re-Evaluation 03/09/15   Authorization Type [employer] discount   OT Start Time 1100   OT Stop Time 1156   OT Time Calculation (min) 56 min   Activity Tolerance Patient tolerated treatment well      Past Medical History  Diagnosis Date  . Tenosynovitis of foot and ankle 09/28/2013  . Hypertension   . Breast cancer [facility])     Past Surgical History  Procedure Laterality Date  . Bunionectomy Left 01/26/2014    @ [facility]  . Cotton osteotomy w/ bone graft Left 01/26/2014    @ [place]  . Neck surgery    . Cholecystectomy    . Breast lumpectomy    . Breast reconstruction    . Knee surgery    . Abdominal hysterectomy      There were no vitals filed for this visit.  Visit Diagnosis:  Pain of right hand  Stiffness of joint, hand, right      Subjective Assessment - 02/14/15 1101    Subjective  I feel like the ionto helped some. I initially had some sharp shooting pain after the ionto, but I'm a little better today   Pertinent History see epic; pt with R hand pain, h/o of carpal tunnel however pain not consistent with carpal tunnel. ? de quervains per MD.    Patient Stated Goals I am hoping therapy can ease some of the pain.    Currently in Pain? Yes   Pain Score 6    Pain Location Finger (Comment which one)  THUMB   Pain Orientation Right   Pain Descriptors / Indicators Throbbing   Pain Type Chronic pain   Pain Onset More than a month ago   Pain Frequency Constant   Aggravating Factors  use   Pain Relieving Factors heat, ice, brace             [facility] OT Assessment - 02/14/15 0001    Coordination   Right 9 Hole Peg Test 29.22 sec   Hand Function   Right Hand Grip (lbs) 35 lbs                  OT Treatments/Exercises (OP) - 02/14/15 0001    ADLs   ADL Comments Assessed STG's and progress to date. See assessment and goal section   Iontophoresis   Type of Iontophoresis Dexamethasone   Location base of thumb   Dose 2.0 mA, 1.5 cc (Dose #2)    Time 20 minutes   Manual Therapy   Manual Therapy Myofascial release;Soft tissue mobilization;Taping   Soft tissue mobilization along radial forearm   Myofascial Release along extensor musculature prior to soft tissue mobs                  OT Short Term Goals - 02/14/15 1145    OT SHORT TERM GOAL #1   Title Pt will be mod I with HEP - 02/09/2015   Status Achieved   OT SHORT TERM GOAL #2   Title Pt will rate pain no more than 7/10  in R hand with simple activity (baseline = 9/10)   Status Not Met  still up to 10/10 at times   OT SHORT TERM GOAL #3   Title Pt will demonstrate improved coordinaton as evidenced by decreasing time on  9 hole peg test by at least 3 seconds (baseline= 32.05)   Status Achieved  02/14/15 = 29.22 sec.    OT SHORT TERM GOAL #4   Title Pt will demonstrate 100% composite flexion in preparation for improved functional use of R hand   Status Partially Met  inconsistent   OT SHORT TERM GOAL #5   Title Pt will be indepenent in edema mgmt techniques   Status Achieved   OT SHORT TERM GOAL #6   Title Pt will improve grip strength by at least 5 pounds to assist with functional tasks.(baseline = 10 pounds)   Status Achieved  02/14/15 = 35 lbs           OT Long Term Goals - 01/29/15 1434    OT LONG TERM GOAL #1   Title Pt will be mod I with HEP - 03/09/2015   Status On-going   OT LONG TERM GOAL #2   Title Pt will rate pain no greater than 3/10 in R hand with functional use.   Status On-going   OT LONG TERM GOAL #3   Title Pt  will demonstrate improved grip strength by at least 10 pounds to assist with functional tasks (baseline = 10 pounds)   Status On-going   OT LONG TERM GOAL #4   Title Pt will be able to use R hand as dominant for basic self care tasks   Status On-going   OT LONG TERM GOAL #5   Title Pt will demonstrate improved coordination as evidenced by decreasing time on 9 hole peg test by at least 5 seconds.    Status On-going               Plan - 02/14/15 1419    Clinical Impression Statement Pt met 5/6 STG's. Pain still fluctuates and is most limiting factor in rehab potential.    Plan ultrasound, exercises, continue iontophoresis #3   Consulted and Agree with Plan of Care Patient        Problem List Patient Active Problem List   Diagnosis Date Noted  . Morton neuroma 02/08/2015  . Osteoarthritis of right knee 01/03/2015  . Myopia of both eyes 11/29/2014  . Right hand pain 11/06/2014  . Hypercholesteremia 10/11/2014  . Essential hypertension 10/10/2014  . Healthcare maintenance 10/10/2014  . Prediabetes 10/10/2014  . Depression, major, recurrent, moderate ([place]) 06/08/2014  . Equinus deformity of foot, acquired 08/24/2013    [name], OTR/L 02/14/2015, 2:21 PM  [facility] [address] [place], [place], [address] Phone: [phone]   Fax:  [phone]  Name: Maria Sims MRN: [id number] Date of Birth: [birth year]

## 2015-02-19 ENCOUNTER — Ambulatory Visit: Payer: No Typology Code available for payment source | Admitting: Occupational Therapy

## 2015-02-21 ENCOUNTER — Ambulatory Visit: Payer: No Typology Code available for payment source | Admitting: Occupational Therapy

## 2015-02-21 ENCOUNTER — Encounter: Payer: Self-pay | Admitting: Occupational Therapy

## 2015-02-21 DIAGNOSIS — M79641 Pain in right hand: Secondary | ICD-10-CM

## 2015-02-21 DIAGNOSIS — M25641 Stiffness of right hand, not elsewhere classified: Secondary | ICD-10-CM

## 2015-02-21 NOTE — Therapy (Signed)
[facility] [address] [place], [place], [address] Phone: [phone]   Fax:  [phone]  Occupational Therapy Treatment  Patient Details  Name: Maria Sims MRN: [id number] Date of Birth: Jun 11, 1960 Referring Provider: Dr. Blase Mess  Encounter Date: 02/21/2015      OT End of Session - 02/21/15 1052    Visit Number 9   Number of Visits 16   Date for OT Re-Evaluation 03/09/15   Authorization Type [employer] discount   OT Start Time 1015   OT Stop Time 1103   OT Time Calculation (min) 48 min   Activity Tolerance Patient tolerated treatment well      Past Medical History  Diagnosis Date  . Tenosynovitis of foot and ankle 09/28/2013  . Hypertension   . Breast cancer [facility])     Past Surgical History  Procedure Laterality Date  . Bunionectomy Left 01/26/2014    @ [facility]  . Cotton osteotomy w/ bone graft Left 01/26/2014    @ [facility]  . Neck surgery    . Cholecystectomy    . Breast lumpectomy    . Breast reconstruction    . Knee surgery    . Abdominal hysterectomy      There were no vitals filed for this visit.  Visit Diagnosis:  Pain of right hand  Stiffness of joint, hand, right      Subjective Assessment - 02/21/15 1023    Pertinent History see epic; pt with R hand pain, h/o of carpal tunnel however pain not consistent with carpal tunnel. ? de quervains per MD.    Patient Stated Goals I am hoping therapy can ease some of the pain.    Currently in Pain? Yes   Pain Score 4    Pain Location --  thumb   Pain Orientation Right   Pain Descriptors / Indicators Burning   Pain Type Chronic pain   Pain Onset More than a month ago   Pain Frequency Constant   Aggravating Factors  use   Pain Relieving Factors heat, ice, brace                      OT Treatments/Exercises (OP) - 02/21/15 0001    Wrist Exercises   Other wrist exercises isolated wrist flex/ext (with thumb  relaxed) x 15 reps each. Followed by radial deviation x 15 reps with thumb relaxed   Other wrist exercises Thumb flex/ext x 10 reps with wrist neutral    Ultrasound   Ultrasound Location radial wrist/base of thumb    Ultrasound Parameters 3 Mhz, 20% pulsed, 0.8wts/cm2 x 8 min. with no adverse reactions   Ultrasound Goals Pain  tightness, incr circulation   Iontophoresis   Type of Iontophoresis Dexamethasone   Location base of thumb   Dose 1.9 mA, 1.5 cc (dose #3)    Time 22 minutes                  OT Short Term Goals - 02/14/15 1145    OT SHORT TERM GOAL #1   Title Pt will be mod I with HEP - 02/09/2015   Status Achieved   OT SHORT TERM GOAL #2   Title Pt will rate pain no more than 7/10 in R hand with simple activity (baseline = 9/10)   Status Not Met  still up to 10/10 at times   OT SHORT TERM GOAL #3   Title Pt will demonstrate improved coordinaton as evidenced  by decreasing time on  9 hole peg test by at least 3 seconds (baseline= 32.05)   Status Achieved  02/14/15 = 29.22 sec.    OT SHORT TERM GOAL #4   Title Pt will demonstrate 100% composite flexion in preparation for improved functional use of R hand   Status Partially Met  inconsistent   OT SHORT TERM GOAL #5   Title Pt will be indepenent in edema mgmt techniques   Status Achieved   OT SHORT TERM GOAL #6   Title Pt will improve grip strength by at least 5 pounds to assist with functional tasks.(baseline = 10 pounds)   Status Achieved  02/14/15 = 35 lbs           OT Long Term Goals - 01/29/15 1434    OT LONG TERM GOAL #1   Title Pt will be mod I with HEP - 03/09/2015   Status On-going   OT LONG TERM GOAL #2   Title Pt will rate pain no greater than 3/10 in R hand with functional use.   Status On-going   OT LONG TERM GOAL #3   Title Pt will demonstrate improved grip strength by at least 10 pounds to assist with functional tasks (baseline = 10 pounds)   Status On-going   OT LONG TERM GOAL #4   Title  Pt will be able to use R hand as dominant for basic self care tasks   Status On-going   OT LONG TERM GOAL #5   Title Pt will demonstrate improved coordination as evidenced by decreasing time on 9 hole peg test by at least 5 seconds.    Status On-going               Plan - 02/21/15 1052    Clinical Impression Statement Pt with slightly decreased pain today and overall feels a little better.    Plan continue pulsed [place], exercises, continue iontophoresis #4   Consulted and Agree with Plan of Care Patient        Problem List Patient Active Problem List   Diagnosis Date Noted  . Morton neuroma 02/08/2015  . Osteoarthritis of right knee 01/03/2015  . Myopia of both eyes 11/29/2014  . Right hand pain 11/06/2014  . Hypercholesteremia 10/11/2014  . Essential hypertension 10/10/2014  . Healthcare maintenance 10/10/2014  . Prediabetes 10/10/2014  . Depression, major, recurrent, moderate ([place]) 06/08/2014  . Equinus deformity of foot, acquired 08/24/2013    [name], OTR/L 02/21/2015, 10:54 AM  [facility] [address] [place], [place], [address] Phone: [phone]   Fax:  [phone]  Name: Maria Sims MRN: [id number] Date of Birth: [birth year]

## 2015-02-22 ENCOUNTER — Ambulatory Visit (INDEPENDENT_AMBULATORY_CARE_PROVIDER_SITE_OTHER): Payer: No Typology Code available for payment source | Admitting: Family Medicine

## 2015-02-22 ENCOUNTER — Encounter: Payer: Self-pay | Admitting: Family Medicine

## 2015-02-22 VITALS — BP 134/92 | HR 73 | Ht 66.0 in | Wt 262.0 lb

## 2015-02-22 DIAGNOSIS — M25562 Pain in left knee: Secondary | ICD-10-CM

## 2015-02-22 DIAGNOSIS — M1711 Unilateral primary osteoarthritis, right knee: Secondary | ICD-10-CM

## 2015-02-22 MED ORDER — METHYLPREDNISOLONE ACETATE 40 MG/ML IJ SUSP
40.0000 mg | Freq: Once | INTRAMUSCULAR | Status: AC
  Administered 2015-02-22: 40 mg via INTRA_ARTICULAR

## 2015-02-22 NOTE — Patient Instructions (Signed)
You were given an injection into your left knee for arthritis. We will refer you to orthopedics for your right knee arthritis since it is not responding to the conservative measures. Continue with therapy for your hand and wrist. If after a few more weeks you're still struggling we can consider cortisone injection (for tendinitis or carpal tunnel) or nerve conduction studies.

## 2015-02-26 ENCOUNTER — Ambulatory Visit: Payer: No Typology Code available for payment source | Admitting: Occupational Therapy

## 2015-02-26 ENCOUNTER — Encounter: Payer: Self-pay | Admitting: Occupational Therapy

## 2015-02-26 ENCOUNTER — Telehealth: Payer: Self-pay | Admitting: Family Medicine

## 2015-02-26 DIAGNOSIS — M79641 Pain in right hand: Secondary | ICD-10-CM

## 2015-02-26 DIAGNOSIS — M25562 Pain in left knee: Secondary | ICD-10-CM | POA: Insufficient documentation

## 2015-02-26 DIAGNOSIS — M25641 Stiffness of right hand, not elsewhere classified: Secondary | ICD-10-CM

## 2015-02-26 NOTE — Assessment & Plan Note (Signed)
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  She does have a small lateral meniscus tear but given degree of her arthritis her pain is more likely due to the DJD.  Cortisone and supartz series without benefit unfortunately.  Will refer to ortho to discuss knee replacement.

## 2015-02-26 NOTE — Telephone Encounter (Signed)
Spoke to patient and gave her information about ortho. Patient still wants [place] to set up an appointment.

## 2015-02-26 NOTE — Assessment & Plan Note (Signed)
likely with similar amount of DJD here though right knee more symptomatic.  Went ahead with intraarticular injection today.  After informed written consent, patient was seated on exam table. Left knee was prepped with alcohol swab and utilizing anteromedial approach, patient's left knee was injected intraarticularly with 3:1 marcaine: depomedrol. Patient tolerated the procedure well without immediate complications.

## 2015-02-26 NOTE — Therapy (Signed)
[facility] [address] [place], [place], [address] Phone: [phone]   Fax:  [phone]  Occupational Therapy Treatment  Patient Details  Name: Maria Sims MRN: [id number] Date of Birth: [birth year] Referring Provider: Dr. Blase Mess  Encounter Date: 02/26/2015      OT End of Session - 02/26/15 1056    Visit Number 10   Number of Visits 16   Date for OT Re-Evaluation 03/09/15   Authorization Type [employer] discount   OT Start Time 1020   OT Stop Time 1110   OT Time Calculation (min) 50 min   Activity Tolerance Patient tolerated treatment well      Past Medical History  Diagnosis Date  . Tenosynovitis of foot and ankle 09/28/2013  . Hypertension   . Breast cancer [facility])     Past Surgical History  Procedure Laterality Date  . Bunionectomy Left 01/26/2014    @ [facility]  . Cotton osteotomy w/ bone graft Left 01/26/2014    @ [facility]  . Neck surgery    . Cholecystectomy    . Breast lumpectomy    . Breast reconstruction    . Knee surgery    . Abdominal hysterectomy      There were no vitals filed for this visit.  Visit Diagnosis:  Pain of right hand  Stiffness of joint, hand, right      Subjective Assessment - 02/26/15 1021    Subjective  My hand doesn't really hurt right now   Pertinent History see epic; pt with R hand pain, h/o of carpal tunnel however pain not consistent with carpal tunnel. ? de quervains per MD.    Patient Stated Goals I am hoping therapy can ease some of the pain.    Currently in Pain? Yes   Pain Score 6    Pain Location Knee   Pain Orientation Right   Pain Type Chronic pain   Pain Onset More than a month ago   Pain Frequency Constant   Aggravating Factors  standing, walking, going up stairs   Pain Relieving Factors elevation                      OT Treatments/Exercises (OP) - 02/26/15 0001    Ultrasound   Ultrasound Location radial  forearm/wrist    Ultrasound Parameters 3 Mhz, 50% pulsed, 0.8 wts/cm2 with no adverse reactions   Ultrasound Goals Pain  tightness, incr circulation   Iontophoresis   Type of Iontophoresis Dexamethasone   Location base of thumb   Dose 2.0 mA, 1.5 cc (dose #4)   Time 20 minutes   Manual Therapy   Soft tissue mobilization along radial forearm   Myofascial Release along extensor musculature prior to soft tissue mobs                  OT Short Term Goals - 02/26/15 1056    OT SHORT TERM GOAL #1   Title Pt will be mod I with HEP - 02/09/2015   Status Achieved   OT SHORT TERM GOAL #2   Title Pt will rate pain no more than 7/10 in R hand with simple activity (baseline = 9/10)   Status Achieved  up to 7/10 at highest in the last 2 weeks   OT SHORT TERM GOAL #3   Title Pt will demonstrate improved coordinaton as evidenced by decreasing time on  9 hole peg test by at least 3 seconds (baseline= 32.05)  Status Achieved  02/14/15 = 29.22 sec.    OT SHORT TERM GOAL #4   Title Pt will demonstrate 100% composite flexion in preparation for improved functional use of R hand   Status Partially Met  inconsistent   OT SHORT TERM GOAL #5   Title Pt will be indepenent in edema mgmt techniques   Status Achieved   OT SHORT TERM GOAL #6   Title Pt will improve grip strength by at least 5 pounds to assist with functional tasks.(baseline = 10 pounds)   Status Achieved  02/14/15 = 35 lbs           OT Long Term Goals - 01/29/15 1434    OT LONG TERM GOAL #1   Title Pt will be mod I with HEP - 03/09/2015   Status On-going   OT LONG TERM GOAL #2   Title Pt will rate pain no greater than 3/10 in R hand with functional use.   Status On-going   OT LONG TERM GOAL #3   Title Pt will demonstrate improved grip strength by at least 10 pounds to assist with functional tasks (baseline = 10 pounds)   Status On-going   OT LONG TERM GOAL #4   Title Pt will be able to use R hand as dominant for basic  self care tasks   Status On-going   OT LONG TERM GOAL #5   Title Pt will demonstrate improved coordination as evidenced by decreasing time on 9 hole peg test by at least 5 seconds.    Status On-going               Plan - 02/26/15 1057    Clinical Impression Statement Pt has met all STG's at this time. Pt with no pain today in hand/wrist and overall decreased pain the last 2 weeks.    Plan continue [place], ionto #5, exercises   Consulted and Agree with Plan of Care Patient        Problem List Patient Active Problem List   Diagnosis Date Noted  . Morton neuroma 02/08/2015  . Osteoarthritis of right knee 01/03/2015  . Myopia of both eyes 11/29/2014  . Right hand pain 11/06/2014  . Hypercholesteremia 10/11/2014  . Essential hypertension 10/10/2014  . Healthcare maintenance 10/10/2014  . Prediabetes 10/10/2014  . Depression, major, recurrent, moderate ([place]) 06/08/2014  . Equinus deformity of foot, acquired 08/24/2013    [name], OTR/L 02/26/2015, 10:59 AM  [facility] [address] [place], [place], [address] Phone: [phone]   Fax:  [phone]  Name: Maria Sims MRN: [id number] Date of Birth: Nov 25, 1960

## 2015-02-26 NOTE — Progress Notes (Signed)
PCP: Osa Craver, MD  Subjective:   HPI: Patient is a 55 y.o. female here for bilateral knee pain.  8/29: Patient reports she's had 3 years of bilateral knee pain. Started mainly after working on concrete floors standing for 7 1/2 hours a day 7 days a week. No acute injury or trauma. Difficulty bending, kneeling, getting up after prolonged sitting. Difficulty walking for exercise. Some catching. Remote history of partial meniscectomy of right knee. No locking, giving out.  9/29: Patient returns with 7/10 bilateral knee pain. Achy and constant pain. Diffusely anterior. Problems bending kneeling, after prolonged driving and sitting. No locking, giving out.  12/6: Patient returns for repeat cortisone injection for right knee.  12/29: Patient returns to start [place] series.  01/18/15: Patient returns for second supartz injection. Pain level 5/10.  01/25/15: Patient returns for third supartz injection. Pain improved - 0/10 currently - comes and goes now.  2/9: Patient returns with continued 5/10 level of pain in right knee despite supartz series - did not notice a difference. Interested in injection for left knee given this is also 5/10, anterior, sharp and throbbing. Worse with ambulation. No skin changes, fever.  Past Medical History  Diagnosis Date  . Tenosynovitis of foot and ankle 09/28/2013  . Hypertension   . Breast cancer [facility])     Current Outpatient Prescriptions on File Prior to Visit  Medication Sig Dispense Refill  . busPIRone (BUSPAR) 7.5 MG tablet Take 1 tablet (7.5 mg total) by mouth 2 (two) times daily. 60 tablet 5  . cyclobenzaprine (FLEXERIL) 5 MG tablet Take 1 tablet (5 mg total) by mouth at bedtime. 30 tablet 1  . FLUoxetine (PROZAC) 40 MG capsule Take 1 capsule (40 mg total) by mouth daily. 30 capsule 11  . lisinopril-hydrochlorothiazide (PRINZIDE,ZESTORETIC) 20-12.5 MG tablet Take 1 tablet by mouth daily. 30 tablet 11  . nabumetone  (RELAFEN) 500 MG tablet Take 500 mg by mouth.    . oxyCODONE-acetaminophen (ROXICET) 5-325 MG tablet Take 1 tablet by mouth every 6 (six) hours as needed for severe pain. 60 tablet 0  . pravastatin (PRAVACHOL) 40 MG tablet Take 1 tablet (40 mg total) by mouth daily. 30 tablet 11  . zolpidem (AMBIEN) 5 MG tablet Take 1 tablet (5 mg total) by mouth at bedtime as needed for sleep. 30 tablet 5   No current facility-administered medications on file prior to visit.    Past Surgical History  Procedure Laterality Date  . Bunionectomy Left 01/26/2014    @ [facility]  . Cotton osteotomy w/ bone graft Left 01/26/2014    @ [facility]  . Neck surgery    . Cholecystectomy    . Breast lumpectomy    . Breast reconstruction    . Knee surgery    . Abdominal hysterectomy      Allergies  Allergen Reactions  . Pineapple     Social History   Social History  . Marital Status: Single    Spouse Name: N/A  . Number of Children: N/A  . Years of Education: N/A   Occupational History  . Not on file.   Social History Main Topics  . Smoking status: Never Smoker   . Smokeless tobacco: Never Used  . Alcohol Use: 0.0 oz/week    0 Standard drinks or equivalent per week     Comment: Social  . Drug Use: No  . Sexual Activity: Not on file   Other Topics Concern  . Not on file   Social  History Narrative    No family history on file.  BP 134/92 mmHg  Pulse 73  Ht 5\' 6"  (1.676 m)  Wt 262 lb (118.842 kg)  BMI 42.31 kg/m2  Review of Systems: See HPI above.    Objective:  Physical Exam:  Gen: NAD, comfortable in exam room.  Bilateral knees: No gross deformity, ecchymoses, effusions. TTP mainly medial joint lines but also post patellar facets, lateral joint lines bilaterally. FROM. Negative ant/post drawers. Negative valgus/varus testing. Negative lachmanns. Negative mcmurrays, apleys. NV intact distally.    Assessment & Plan:  1. Right knee pain - consistent with  moderate-severe DJD confirmed by MRI.  She does have a small lateral meniscus tear but given degree of her arthritis her pain is more likely due to the DJD.  Cortisone and supartz series without benefit unfortunately.  Will refer to ortho to discuss knee replacement.    2. Left knee pain - likely with similar amount of DJD here though right knee more symptomatic.  Went ahead with intraarticular injection today.  After informed written consent, patient was seated on exam table. Left knee was prepped with alcohol swab and utilizing anteromedial approach, patient's left knee was injected intraarticularly with 3:1 marcaine: depomedrol. Patient tolerated the procedure well without immediate complications.

## 2015-02-28 ENCOUNTER — Ambulatory Visit: Payer: No Typology Code available for payment source | Admitting: Occupational Therapy

## 2015-02-28 ENCOUNTER — Encounter: Payer: Self-pay | Admitting: Occupational Therapy

## 2015-02-28 DIAGNOSIS — M25641 Stiffness of right hand, not elsewhere classified: Secondary | ICD-10-CM

## 2015-02-28 DIAGNOSIS — M79641 Pain in right hand: Secondary | ICD-10-CM

## 2015-02-28 NOTE — Therapy (Signed)
[facility] [address] [place], [place], [address] Phone: [phone]   Fax:  [phone]  Occupational Therapy Treatment  Patient Details  Name: Maria Sims MRN: [id number] Date of Birth: [birth year] Referring Provider: Dr. Blase Mess  Encounter Date: 02/28/2015      OT End of Session - 02/28/15 1047    Visit Number 11   Number of Visits 16   Date for OT Re-Evaluation 03/09/15   Authorization Type [employer] discount   OT Start Time 1020   OT Stop Time 1105   OT Time Calculation (min) 45 min   Activity Tolerance Patient tolerated treatment well      Past Medical History  Diagnosis Date  . Tenosynovitis of foot and ankle 09/28/2013  . Hypertension   . Breast cancer [facility])     Past Surgical History  Procedure Laterality Date  . Bunionectomy Left 01/26/2014    @ [facility]  . Cotton osteotomy w/ bone graft Left 01/26/2014    @ [facility]  . Neck surgery    . Cholecystectomy    . Breast lumpectomy    . Breast reconstruction    . Knee surgery    . Abdominal hysterectomy      There were no vitals filed for this visit.  Visit Diagnosis:  Pain of right hand  Stiffness of joint, hand, right      Subjective Assessment - 02/28/15 1023    Pertinent History see epic; pt with R hand pain, h/o of carpal tunnel however pain not consistent with carpal tunnel. ? de quervains per MD.    Patient Stated Goals I am hoping therapy can ease some of the pain.    Currently in Pain? No/denies  in UE's (Rt knee pain - O.T. not addressing)                       OT Treatments/Exercises (OP) - 02/28/15 0001    Hand Exercises   Other Hand Exercises thumb flex and radial abd x 10 reps each with wrist neutral   Other Hand Exercises wrist flexion, extension, radial and ulnar deviation with thumb relaxed x 10 reps each   Ultrasound   Ultrasound Location radial wrist/forearm   Ultrasound Parameters 3  Mhz, 50% pulsed, 0.8 wts/cm2 with no adverse reactions   Ultrasound Goals --  tightness, increase circulation at beginning of session   Iontophoresis   Type of Iontophoresis Dexamethasone   Location base of thumb   Dose 2.0 mA, 1.5 cc (dose #5)   Time 20 minutes  at end of session                  OT Short Term Goals - 02/26/15 1056    OT SHORT TERM GOAL #1   Title Pt will be mod I with HEP - 02/09/2015   Status Achieved   OT SHORT TERM GOAL #2   Title Pt will rate pain no more than 7/10 in R hand with simple activity (baseline = 9/10)   Status Achieved  up to 7/10 at highest in the last 2 weeks   OT SHORT TERM GOAL #3   Title Pt will demonstrate improved coordinaton as evidenced by decreasing time on  9 hole peg test by at least 3 seconds (baseline= 32.05)   Status Achieved  02/14/15 = 29.22 sec.    OT SHORT TERM GOAL #4   Title Pt will demonstrate 100% composite flexion in preparation for  improved functional use of R hand   Status Partially Met  inconsistent   OT SHORT TERM GOAL #5   Title Pt will be indepenent in edema mgmt techniques   Status Achieved   OT SHORT TERM GOAL #6   Title Pt will improve grip strength by at least 5 pounds to assist with functional tasks.(baseline = 10 pounds)   Status Achieved  02/14/15 = 35 lbs           OT Long Term Goals - 01/29/15 1434    OT LONG TERM GOAL #1   Title Pt will be mod I with HEP - 03/09/2015   Status On-going   OT LONG TERM GOAL #2   Title Pt will rate pain no greater than 3/10 in R hand with functional use.   Status On-going   OT LONG TERM GOAL #3   Title Pt will demonstrate improved grip strength by at least 10 pounds to assist with functional tasks (baseline = 10 pounds)   Status On-going   OT LONG TERM GOAL #4   Title Pt will be able to use R hand as dominant for basic self care tasks   Status On-going   OT LONG TERM GOAL #5   Title Pt will demonstrate improved coordination as evidenced by decreasing time  on 9 hole peg test by at least 5 seconds.    Status On-going               Plan - 02/28/15 1049    Clinical Impression Statement Pt with no pain in Rt hand the last 2 sessions. Pt will be progressing to strengthening after last iontophoresis session   Plan continue [place], last ionto #6, exercises   Consulted and Agree with Plan of Care Patient        Problem List Patient Active Problem List   Diagnosis Date Noted  . Left knee pain 02/26/2015  . Morton neuroma 02/08/2015  . Osteoarthritis of right knee 01/03/2015  . Myopia of both eyes 11/29/2014  . Right hand pain 11/06/2014  . Hypercholesteremia 10/11/2014  . Essential hypertension 10/10/2014  . Healthcare maintenance 10/10/2014  . Prediabetes 10/10/2014  . Depression, major, recurrent, moderate ([place]) 06/08/2014  . Equinus deformity of foot, acquired 08/24/2013    [name], OTR/L 02/28/2015, 10:52 AM  [facility] [address] [place], [place], [address] Phone: [phone]   Fax:  [phone]  Name: Devann Cribb MRN: [id number] Date of Birth: Jun 22, 1960

## 2015-03-05 ENCOUNTER — Encounter: Payer: Self-pay | Admitting: Occupational Therapy

## 2015-03-05 ENCOUNTER — Ambulatory Visit: Payer: No Typology Code available for payment source | Admitting: Occupational Therapy

## 2015-03-05 DIAGNOSIS — R201 Hypoesthesia of skin: Secondary | ICD-10-CM

## 2015-03-05 DIAGNOSIS — M25641 Stiffness of right hand, not elsewhere classified: Secondary | ICD-10-CM

## 2015-03-05 NOTE — Therapy (Signed)
[facility] [address] [place], [place], [address] Phone: [phone]   Fax:  [phone]  Occupational Therapy Treatment  Patient Details  Name: Maria Sims MRN: [id number] Date of Birth: 25-Oct-1960 Referring Provider: Dr. Blase Mess  Encounter Date: 03/05/2015      OT End of Session - 03/05/15 1057    Visit Number 12   Number of Visits 16   Date for OT Re-Evaluation 03/09/15   OT Start Time 1018   OT Stop Time 1111   OT Time Calculation (min) 53 min   Activity Tolerance Patient tolerated treatment well      Past Medical History  Diagnosis Date  . Tenosynovitis of foot and ankle 09/28/2013  . Hypertension   . Breast cancer [facility])     Past Surgical History  Procedure Laterality Date  . Bunionectomy Left 01/26/2014    @ [facility]  . Cotton osteotomy w/ bone graft Left 01/26/2014    @ [place]  . Neck surgery    . Cholecystectomy    . Breast lumpectomy    . Breast reconstruction    . Knee surgery    . Abdominal hysterectomy      There were no vitals filed for this visit.  Visit Diagnosis:  Stiffness of joint, hand, right  Impaired sensation      Subjective Assessment - 03/05/15 1023    Subjective  I had a sharp shooting pain yesterday, and a burning in my hand this morning, but it only lasted about 2 minutes.    Pertinent History see epic; pt with R hand pain, h/o of carpal tunnel however pain not consistent with carpal tunnel. ? de quervains per MD.    Patient Stated Goals I am hoping therapy can ease some of the pain.    Currently in Pain? No/denies                      OT Treatments/Exercises (OP) - 03/05/15 0001    ADLs   ADL Comments Discussed ways to reduce occasional pain from suspected CTS. Reviewed things to avoid, task modifications, and joint protection strategies   Ultrasound   Ultrasound Location radial forearm/wrist   Ultrasound Parameters 3 Mhz,  50% pulsed, 0.8 wts/cm2 with no adverse reactions X 8 minutes   Ultrasound Goals --  for tightness and incr. circulation   Iontophoresis   Type of Iontophoresis Dexamethasone   Location base of thumb   Dose 2.0 mA, 1.5 cc (dose #6)   Time 20 minutes                  OT Short Term Goals - 02/26/15 1056    OT SHORT TERM GOAL #1   Title Pt will be mod I with HEP - 02/09/2015   Status Achieved   OT SHORT TERM GOAL #2   Title Pt will rate pain no more than 7/10 in R hand with simple activity (baseline = 9/10)   Status Achieved  up to 7/10 at highest in the last 2 weeks   OT SHORT TERM GOAL #3   Title Pt will demonstrate improved coordinaton as evidenced by decreasing time on  9 hole peg test by at least 3 seconds (baseline= 32.05)   Status Achieved  02/14/15 = 29.22 sec.    OT SHORT TERM GOAL #4   Title Pt will demonstrate 100% composite flexion in preparation for improved functional use of R hand   Status Partially  Met  inconsistent   OT SHORT TERM GOAL #5   Title Pt will be indepenent in edema mgmt techniques   Status Achieved   OT SHORT TERM GOAL #6   Title Pt will improve grip strength by at least 5 pounds to assist with functional tasks.(baseline = 10 pounds)   Status Achieved  02/14/15 = 35 lbs           OT Long Term Goals - 01/29/15 1434    OT LONG TERM GOAL #1   Title Pt will be mod I with HEP - 03/09/2015   Status On-going   OT LONG TERM GOAL #2   Title Pt will rate pain no greater than 3/10 in R hand with functional use.   Status On-going   OT LONG TERM GOAL #3   Title Pt will demonstrate improved grip strength by at least 10 pounds to assist with functional tasks (baseline = 10 pounds)   Status On-going   OT LONG TERM GOAL #4   Title Pt will be able to use R hand as dominant for basic self care tasks   Status On-going   OT LONG TERM GOAL #5   Title Pt will demonstrate improved coordination as evidenced by decreasing time on 9 hole peg test by at least 5  seconds.    Status On-going               Plan - 03/05/15 1059    Clinical Impression Statement Pt progressing with only ocassional burning pain (? more nerve related)    Plan begin assessing LTG's, check schedule to make sure pt has 1 more week of therapy, begin strengthening   Consulted and Agree with Plan of Care Patient        Problem List Patient Active Problem List   Diagnosis Date Noted  . Left knee pain 02/26/2015  . Morton neuroma 02/08/2015  . Osteoarthritis of right knee 01/03/2015  . Myopia of both eyes 11/29/2014  . Right hand pain 11/06/2014  . Hypercholesteremia 10/11/2014  . Essential hypertension 10/10/2014  . Healthcare maintenance 10/10/2014  . Prediabetes 10/10/2014  . Depression, major, recurrent, moderate ([place]) 06/08/2014  . Equinus deformity of foot, acquired 08/24/2013    [name], OTR/L 03/05/2015, 11:02 AM  [facility] [address] [place], [place], [address] Phone: [phone]   Fax:  [phone]  Name: Maria Sims MRN: [id number] Date of Birth: [birth year]

## 2015-03-07 ENCOUNTER — Encounter: Payer: No Typology Code available for payment source | Admitting: Dietician

## 2015-03-07 ENCOUNTER — Encounter: Payer: No Typology Code available for payment source | Admitting: Internal Medicine

## 2015-03-07 ENCOUNTER — Ambulatory Visit: Payer: No Typology Code available for payment source | Admitting: Occupational Therapy

## 2015-03-08 ENCOUNTER — Ambulatory Visit: Payer: No Typology Code available for payment source | Admitting: Occupational Therapy

## 2015-03-08 DIAGNOSIS — R201 Hypoesthesia of skin: Secondary | ICD-10-CM

## 2015-03-08 DIAGNOSIS — R29898 Other symptoms and signs involving the musculoskeletal system: Secondary | ICD-10-CM

## 2015-03-08 DIAGNOSIS — M79641 Pain in right hand: Secondary | ICD-10-CM

## 2015-03-08 NOTE — Patient Instructions (Signed)
1. Grip Strengthening (Resistive Putty)   Squeeze putty using thumb and all fingers. Repeat _15___ times. Do __1-2__ sessions per day.   2. Roll putty into tube on table and pinch between each finger and thumb.  Roll out 3-4 times   Wrist Flexion: Resisted   With right palm up, 1 pound weight in hand, bend wrist up. Return slowly. Repeat 15 times per set.  Do 1-2 sessions per day.  Wrist Extension: Resisted   With right palm down, 1 pound weight in hand, bend wrist up. Return slowly. Repeat 15 times per set. Do 1-2 sessions per day.      Ulnar Deviation (Isometric)    With forearm held steady and thumb up, use other hand to resist downward movement at wrist. Hold 5 seconds. Relax. Repeat 10 times. Do 1-2 sessions per day.

## 2015-03-08 NOTE — Therapy (Signed)
[facility] [address] [place], [place], [address] Phone: [phone]   Fax:  [phone]  Occupational Therapy Treatment  Patient Details  Name: Maria Sims MRN: [id number] Date of Birth: 12/09/1960 Referring Provider: Dr. Shane Hudnell  Encounter Date: 03/08/2015      OT End of Session - 03/08/15 1600    Visit Number 13   Number of Visits 16   Date for OT Re-Evaluation 03/09/15   Authorization Type [employer] discount   OT Start Time 1545  pt arrieved late   OT Stop Time 1615   OT Time Calculation (min) 30 min   Activity Tolerance Patient tolerated treatment well   Behavior During Therapy WFL for tasks assessed/performed      Past Medical History  Diagnosis Date  . Tenosynovitis of foot and ankle 09/28/2013  . Hypertension   . Breast cancer (HCC)     Past Surgical History  Procedure Laterality Date  . Bunionectomy Left 01/26/2014    @ [facility]  . Cotton osteotomy w/ bone graft Left 01/26/2014    @ [facility]  . Neck surgery    . Cholecystectomy    . Breast lumpectomy    . Breast reconstruction    . Knee surgery    . Abdominal hysterectomy      There were no vitals filed for this visit.  Visit Diagnosis:  Pain of right hand  Decreased grip strength of right hand  Impaired sensation      Subjective Assessment - 03/08/15 1632    Subjective  Pt reports that she stills has numbness in R thub   Pertinent History see epic; pt with R hand pain, h/o of carpal tunnel however pain not consistent with carpal tunnel. ? de quervains per MD.    Patient Stated Goals I am hoping therapy can ease some of the pain.    Currently in Pain? Yes   Pain Score 3    Pain Location --  radial wrist   Pain Orientation Right   Pain Descriptors / Indicators Burning   Pain Type Chronic pain   Aggravating Factors  heavy gripping/pinching   Pain Relieving Factors rest, splint                       OT Treatments/Exercises (OP) - 03/08/15 0001    Ultrasound   Ultrasound Location radial forearm/wrist   Ultrasound Parameters 3mhz, 50% pulsed, 0.8wts/cm2 with no adverse reactions   Ultrasound Goals Pain  incr      Tendon glides x15.  Pt instructed pt that if pain significantly incr with strengthening HEP that she should discontinue until next visit.  Pt instructed to continue tendon glides and nerve glides at home at this time.  Pt also instructed incr splint wear if pt has flare of pain in the future (after d/c).           OT Education - 03/08/15 1642    Education Details Strengthening HEP--see pt instructions   Person(s) Educated Patient   Methods Explanation;Demonstration;Verbal cues;Handout   Comprehension Verbalized understanding;Returned demonstration;Verbal cues required          OT Short Term Goals - 02/26/15 1056    OT SHORT TERM GOAL #1   Title Pt will be mod I with HEP - 02/09/2015   Status Achieved   OT SHORT TERM GOAL #2   Title Pt will rate pain no more than 7/10 in R hand with simple activity (baseline =   9/10)   Status Achieved  up to 7/10 at highest in the last 2 weeks   OT SHORT TERM GOAL #3   Title Pt will demonstrate improved coordinaton as evidenced by decreasing time on  9 hole peg test by at least 3 seconds (baseline= 32.05)   Status Achieved  02/14/15 = 29.22 sec.    OT SHORT TERM GOAL #4   Title Pt will demonstrate 100% composite flexion in preparation for improved functional use of R hand   Status Partially Met  inconsistent   OT SHORT TERM GOAL #5   Title Pt will be indepenent in edema mgmt techniques   Status Achieved   OT SHORT TERM GOAL #6   Title Pt will improve grip strength by at least 5 pounds to assist with functional tasks.(baseline = 10 pounds)   Status Achieved  02/14/15 = 35 lbs           OT Long Term Goals - 01/29/15 1434    OT LONG TERM GOAL #1   Title Pt will be mod I with HEP - 03/09/2015   Status On-going   OT  LONG TERM GOAL #2   Title Pt will rate pain no greater than 3/10 in R hand with functional use.   Status On-going   OT LONG TERM GOAL #3   Title Pt will demonstrate improved grip strength by at least 10 pounds to assist with functional tasks (baseline = 10 pounds)   Status On-going   OT LONG TERM GOAL #4   Title Pt will be able to use R hand as dominant for basic self care tasks   Status On-going   OT LONG TERM GOAL #5   Title Pt will demonstrate improved coordination as evidenced by decreasing time on 9 hole peg test by at least 5 seconds.    Status On-going               Plan - 03/08/15 1603    Clinical Impression Statement Pt progressing with less pain, but continues to have L thumb numbness.   Plan check LTGs, continue with light strengthening   Consulted and Agree with Plan of Care Patient        Problem List Patient Active Problem List   Diagnosis Date Noted  . Left knee pain 02/26/2015  . Morton neuroma 02/08/2015  . Osteoarthritis of right knee 01/03/2015  . Myopia of both eyes 11/29/2014  . Right hand pain 11/06/2014  . Hypercholesteremia 10/11/2014  . Essential hypertension 10/10/2014  . Healthcare maintenance 10/10/2014  . Prediabetes 10/10/2014  . Depression, major, recurrent, moderate (HCC) 06/08/2014  . Equinus deformity of foot, acquired 08/24/2013    FREEMAN,ANGELA 03/08/2015, 4:49 PM  [facility] [address] [place], [place], [address] Phone: [phone]   Fax:  [phone]  Name: Maitri Hamid MRN: [id number] Date of Birth: 02/17/1960  Angela Freeman, OTR/L [facility] [address] [place], [place]  [address] [phone] phone [phone] 03/08/2015 4:49 PM    

## 2015-03-21 ENCOUNTER — Ambulatory Visit: Payer: No Typology Code available for payment source | Attending: Family Medicine | Admitting: Occupational Therapy

## 2015-03-21 ENCOUNTER — Encounter: Payer: Self-pay | Admitting: Occupational Therapy

## 2015-03-21 DIAGNOSIS — M79641 Pain in right hand: Secondary | ICD-10-CM | POA: Insufficient documentation

## 2015-03-21 DIAGNOSIS — M25641 Stiffness of right hand, not elsewhere classified: Secondary | ICD-10-CM

## 2015-03-21 DIAGNOSIS — R201 Hypoesthesia of skin: Secondary | ICD-10-CM

## 2015-03-21 DIAGNOSIS — M6281 Muscle weakness (generalized): Secondary | ICD-10-CM | POA: Insufficient documentation

## 2015-03-21 DIAGNOSIS — R29898 Other symptoms and signs involving the musculoskeletal system: Secondary | ICD-10-CM

## 2015-03-21 NOTE — Therapy (Signed)
[facility] [address] [place], [place], [address] Phone: [phone]   Fax:  [phone]  Occupational Therapy Treatment  Patient Details  Name: Maria Sims MRN: [id number] Date of Birth: [birth year] Referring Provider: Dr. Blase Mess  Encounter Date: 03/21/2015      OT End of Session - 03/21/15 1312    Visit Number 14   Number of Visits 16   Date for OT Re-Evaluation 03/27/15   Authorization Type [employer] discount   OT Start Time 1230   OT Stop Time 1312   OT Time Calculation (min) 42 min   Activity Tolerance Patient tolerated treatment well      Past Medical History  Diagnosis Date  . Tenosynovitis of foot and ankle 09/28/2013  . Hypertension   . Breast cancer [facility])     Past Surgical History  Procedure Laterality Date  . Bunionectomy Left 01/26/2014    @ [facility]  . Cotton osteotomy w/ bone graft Left 01/26/2014    @ [place]  . Neck surgery    . Cholecystectomy    . Breast lumpectomy    . Breast reconstruction    . Knee surgery    . Abdominal hysterectomy      There were no vitals filed for this visit.  Visit Diagnosis:  Decreased grip strength of right hand  Stiffness of joint, hand, right  Impaired sensation      Subjective Assessment - 03/21/15 1234    Subjective  I've had a lot of numbness in the thumb and some pain wrist and forearm (radially) for short durations   Pertinent History see epic; pt with R hand pain, h/o of carpal tunnel however pain not consistent with carpal tunnel. ? de quervains per MD.    Patient Stated Goals I am hoping therapy can ease some of the pain.    Currently in Pain? Yes   Pain Score 6    Pain Location Wrist   Pain Orientation Right   Pain Descriptors / Indicators Sharp;Shooting   Pain Type Chronic pain   Pain Onset More than a month ago   Pain Frequency Intermittent   Aggravating Factors  overuse   Pain Relieving Factors ice, splint             [facility] OT Assessment - 03/21/15 0001    Coordination   Right 9 Hole Peg Test 20.84 sec.    Hand Function   Right Hand Grip (lbs) 68 lbs                  OT Treatments/Exercises (OP) - 03/21/15 0001    ADLs   ADL Comments Assessed LTG's and progress to date   Wrist Exercises   Other wrist exercises wrist flexion and extension each x 15 reps with 1 lb. weight. Followed by isometric radial and ulnar deviation holding 10 sec. each x 5 reps.    Hand Exercises   Other Hand Exercises Gripper set at 35 lbs resistance to pick up blocks Rt hand for sustained grip strength but d/c task due to increased pain.    Other Hand Exercises Digiflex (red resistance) for mass composite flexion grip strengthening, followed by tip pinch strengthening x 10 reps each finger (small and ring finger together)                   OT Short Term Goals - 03/21/15 1307    OT SHORT TERM GOAL #1   Title Pt will be  mod I with HEP - 02/09/2015   Status Achieved   OT SHORT TERM GOAL #2   Title Pt will rate pain no more than 7/10 in R hand with simple activity (baseline = 9/10)   Status Achieved  up to 7/10 at highest in the last 2 weeks   OT SHORT TERM GOAL #3   Title Pt will demonstrate improved coordinaton as evidenced by decreasing time on  9 hole peg test by at least 3 seconds (baseline= 32.05)   Status Achieved  02/14/15 = 29.22 sec.    OT SHORT TERM GOAL #4   Title Pt will demonstrate 100% composite flexion in preparation for improved functional use of R hand   Status Achieved   OT SHORT TERM GOAL #5   Title Pt will be indepenent in edema mgmt techniques   Status Achieved   OT SHORT TERM GOAL #6   Title Pt will improve grip strength by at least 5 pounds to assist with functional tasks.(baseline = 10 pounds)   Status Achieved  02/14/15 = 35 lbs           OT Long Term Goals - 03/21/15 1305    OT LONG TERM GOAL #1   Title Pt will be mod I with HEP - 03/09/2015   Status  Achieved   OT LONG TERM GOAL #2   Title Pt will rate pain no greater than 3/10 in R hand with functional use.   Status Partially Met  not consistent - up to 6/10   OT LONG TERM GOAL #3   Title Pt will demonstrate improved grip strength by at least 10 pounds to assist with functional tasks (baseline = 10 pounds)   Status Achieved  Rt = 68 lbs   OT LONG TERM GOAL #4   Title Pt will be able to use R hand as dominant for basic self care tasks   Status Achieved  except brushing teeth   OT LONG TERM GOAL #5   Title Pt will demonstrate improved coordination as evidenced by decreasing time on 9 hole peg test by at least 5 seconds.    Status Achieved  Rt = 20.84 sec.                Plan - 03/21/15 1309    Clinical Impression Statement Pt reports hand felt fine at end of session. Pt met 4/5 LTG's   Plan progress strengthening (wrist flex/ext with 2 lb weight), tug of war, putty ex's and d/c O.T. next session   Consulted and Agree with Plan of Care Patient        Problem List Patient Active Problem List   Diagnosis Date Noted  . Left knee pain 02/26/2015  . Morton neuroma 02/08/2015  . Osteoarthritis of right knee 01/03/2015  . Myopia of both eyes 11/29/2014  . Right hand pain 11/06/2014  . Hypercholesteremia 10/11/2014  . Essential hypertension 10/10/2014  . Healthcare maintenance 10/10/2014  . Prediabetes 10/10/2014  . Depression, major, recurrent, moderate ([place]) 06/08/2014  . Equinus deformity of foot, acquired 08/24/2013    [name], OTR/L 03/21/2015, 1:13 PM  [facility] [address] [place], [place], [address] Phone: [phone]   Fax:  [phone]  Name: Jenene Kauffmann MRN: [id number] Date of Birth: [birth year]

## 2015-03-27 ENCOUNTER — Encounter: Payer: Self-pay | Admitting: Occupational Therapy

## 2015-03-27 ENCOUNTER — Ambulatory Visit: Payer: No Typology Code available for payment source | Admitting: Occupational Therapy

## 2015-03-27 DIAGNOSIS — R29898 Other symptoms and signs involving the musculoskeletal system: Secondary | ICD-10-CM

## 2015-03-27 DIAGNOSIS — M79641 Pain in right hand: Secondary | ICD-10-CM

## 2015-03-27 NOTE — Therapy (Signed)
[facility] [address] [place], [place], [address] Phone: [phone]   Fax:  [phone]  Occupational Therapy Treatment  Patient Details  Name: Maria Sims MRN: [id number] Date of Birth: August 07, 1960 Referring Provider: Dr. Blase Mess  Encounter Date: 03/27/2015      OT End of Session - 03/27/15 1134    Visit Number 15   Number of Visits 16   Date for OT Re-Evaluation 03/27/15   Authorization Type [employer] discount   OT Start Time 1110   OT Stop Time 1135   OT Time Calculation (min) 25 min   Activity Tolerance Patient tolerated treatment well      Past Medical History  Diagnosis Date  . Tenosynovitis of foot and ankle 09/28/2013  . Hypertension   . Breast cancer [facility])     Past Surgical History  Procedure Laterality Date  . Bunionectomy Left 01/26/2014    @ [facility]  . Cotton osteotomy w/ bone graft Left 01/26/2014    @ [place]  . Neck surgery    . Cholecystectomy    . Breast lumpectomy    . Breast reconstruction    . Knee surgery    . Abdominal hysterectomy      There were no vitals filed for this visit.  Visit Diagnosis:  Decreased grip strength of right hand  Pain of right hand      Subjective Assessment - 03/27/15 1111    Subjective  I feel good about d/c   Pertinent History see epic; pt with R hand pain, h/o of carpal tunnel however pain not consistent with carpal tunnel. ? de quervains per MD.    Patient Stated Goals I am hoping therapy can ease some of the pain.    Currently in Pain? Yes   Pain Score 2    Pain Location Wrist   Pain Orientation Right   Pain Descriptors / Indicators Numbness   Pain Type Chronic pain   Pain Onset More than a month ago   Pain Frequency Intermittent   Aggravating Factors  overuse   Pain Relieving Factors ice, splint                      OT Treatments/Exercises (OP) - 03/27/15 0001    Wrist Exercises   Other wrist  exercises wrist flexion and extension with 2 lb. weight x 15 reps each.    Other wrist exercises "Tug of war" exercises for isometric wrist strengthening x 5 reps holding 10 sec.    Hand Exercises   Other Hand Exercises Upgraded to green putty resistance and issued - pt demo mass grasp and tip pinch x 10 reps each with green putty                  OT Short Term Goals - 03/21/15 1307    OT SHORT TERM GOAL #1   Title Pt will be mod I with HEP - 02/09/2015   Status Achieved   OT SHORT TERM GOAL #2   Title Pt will rate pain no more than 7/10 in R hand with simple activity (baseline = 9/10)   Status Achieved  up to 7/10 at highest in the last 2 weeks   OT SHORT TERM GOAL #3   Title Pt will demonstrate improved coordinaton as evidenced by decreasing time on  9 hole peg test by at least 3 seconds (baseline= 32.05)   Status Achieved  02/14/15 = 29.22 sec.  OT SHORT TERM GOAL #4   Title Pt will demonstrate 100% composite flexion in preparation for improved functional use of R hand   Status Achieved   OT SHORT TERM GOAL #5   Title Pt will be indepenent in edema mgmt techniques   Status Achieved   OT SHORT TERM GOAL #6   Title Pt will improve grip strength by at least 5 pounds to assist with functional tasks.(baseline = 10 pounds)   Status Achieved  02/14/15 = 35 lbs           OT Long Term Goals - 03/27/15 1138    OT LONG TERM GOAL #1   Title Pt will be mod I with HEP - 03/09/2015   Status Achieved   OT LONG TERM GOAL #2   Title Pt will rate pain no greater than 3/10 in R hand with functional use.   Status Partially Met  not consistent - up to 6/10   OT LONG TERM GOAL #3   Title Pt will demonstrate improved grip strength by at least 10 pounds to assist with functional tasks (baseline = 10 pounds)   Status Achieved  Rt = 68 lbs   OT LONG TERM GOAL #4   Title Pt will be able to use R hand as dominant for basic self care tasks   Status Achieved   OT LONG TERM GOAL #5    Title Pt will demonstrate improved coordination as evidenced by decreasing time on 9 hole peg test by at least 5 seconds.    Status Achieved  Rt = 20.84 sec.                Plan - 03/27/15 1138    Clinical Impression Statement Pt met all LTG's except pain goal not consistent   Plan D/C O.Donnajean Lopes and Agree with Plan of Care Patient        Problem List Patient Active Problem List   Diagnosis Date Noted  . Left knee pain 02/26/2015  . Morton neuroma 02/08/2015  . Osteoarthritis of right knee 01/03/2015  . Myopia of both eyes 11/29/2014  . Right hand pain 11/06/2014  . Hypercholesteremia 10/11/2014  . Essential hypertension 10/10/2014  . Healthcare maintenance 10/10/2014  . Prediabetes 10/10/2014  . Depression, major, recurrent, moderate ([place]) 06/08/2014  . Equinus deformity of foot, acquired 08/24/2013      OCCUPATIONAL THERAPY DISCHARGE SUMMARY  Visits from Start of Care: 15  Current functional level related to goals / functional outcomes: SEE ABOVE   Remaining deficits: Occasional pain Rt wrist (radially) mostly 3/10 and under but occasionally up to 6/10   Education / Equipment: HEP's, A/E recommendations, joint protection techniques and compensatory strategies, pain reduction strategies and use of ice  Plan: Patient agrees to discharge.  Patient goals were met. Patient is being discharged due to meeting the stated rehab goals.  ?????       [name], OTR/L 03/27/2015, 11:40 AM  [facility] [address] [place], [place], [address] Phone: [phone]   Fax:  [phone]  Name: Maria Sims MRN: [id number] Date of Birth: 22-Jul-1960

## 2015-03-28 ENCOUNTER — Ambulatory Visit (INDEPENDENT_AMBULATORY_CARE_PROVIDER_SITE_OTHER): Payer: Self-pay | Admitting: Dietician

## 2015-03-28 ENCOUNTER — Ambulatory Visit (INDEPENDENT_AMBULATORY_CARE_PROVIDER_SITE_OTHER): Payer: Self-pay | Admitting: Internal Medicine

## 2015-03-28 ENCOUNTER — Encounter: Payer: Self-pay | Admitting: Internal Medicine

## 2015-03-28 ENCOUNTER — Telehealth: Payer: Self-pay | Admitting: Family Medicine

## 2015-03-28 VITALS — BP 137/81 | HR 70 | Temp 97.8°F | Ht 66.0 in | Wt 266.4 lb

## 2015-03-28 DIAGNOSIS — G8929 Other chronic pain: Secondary | ICD-10-CM

## 2015-03-28 DIAGNOSIS — R7303 Prediabetes: Secondary | ICD-10-CM

## 2015-03-28 DIAGNOSIS — M545 Low back pain, unspecified: Secondary | ICD-10-CM | POA: Insufficient documentation

## 2015-03-28 DIAGNOSIS — M1711 Unilateral primary osteoarthritis, right knee: Secondary | ICD-10-CM

## 2015-03-28 DIAGNOSIS — F331 Major depressive disorder, recurrent, moderate: Secondary | ICD-10-CM

## 2015-03-28 DIAGNOSIS — Z0000 Encounter for general adult medical examination without abnormal findings: Secondary | ICD-10-CM

## 2015-03-28 DIAGNOSIS — M5416 Radiculopathy, lumbar region: Secondary | ICD-10-CM

## 2015-03-28 DIAGNOSIS — I10 Essential (primary) hypertension: Secondary | ICD-10-CM

## 2015-03-28 MED ORDER — NAPROXEN 500 MG PO TABS: 500.0000 mg | ORAL_TABLET | Freq: Two times a day (BID) | ORAL | Status: DC

## 2015-03-28 NOTE — Assessment & Plan Note (Signed)
BP Readings from Last 3 Encounters:  03/28/15 137/81  02/22/15 134/92  02/08/15 130/81    Lab Results  Component Value Date   NA 141 10/10/2014   K 4.7 10/10/2014   CREATININE 0.67 10/10/2014    Assessment: Blood pressure control:  Controlled Progress toward BP goal:   At goal Comments: Compliant with lisinopril-HCTZ 20-12.5 mg daily.   Plan: Medications:  continue current medications

## 2015-03-28 NOTE — Assessment & Plan Note (Signed)
Patient complains of chronic low back pain with occasional radiation of pain into her lower extremities. Pain is low back is dull, aching with occasional sharp shooting pains into her legs. Pain has persisted for over one year. Pain is worse with long periods of sitting or standing and relieved with rest or laying down. She denies any back injury. She denies weight loss, urinary or stool incontinence.   A/P: Chronic Lumbar Radiculopathy -Naproxen 500 mg BID WC -Emphasized weight loss -Provided with stretches and exercises -Follow up in 3 months, consider gabapentin at that time

## 2015-03-28 NOTE — Patient Instructions (Signed)
TAKE NAPROXEN 500 MG TWICE A DAY WITH FOOD.  CONTINUE ALL OTHER MEDICATIONS THE SAME.  PLEASE RETURN IN 3 MONTHS FOR FOLLOW UP.  SCHEDULE A PAP SMEAR WITH ME AT YOUR CONVENIENCE.  Sciatica With Rehab The sciatic nerve runs from the back down the leg and is responsible for sensation and control of the muscles in the back (posterior) side of the thigh, lower leg, and foot. Sciatica is a condition that is characterized by inflammation of this nerve.  SYMPTOMS   Signs of nerve damage, including numbness and/or weakness along the posterior side of the lower extremity.  Pain in the back of the thigh that may also travel down the leg.  Pain that worsens when sitting for long periods of time.  Occasionally, pain in the back or buttock. CAUSES  Inflammation of the sciatic nerve is the cause of sciatica. The inflammation is due to something irritating the nerve. Common sources of irritation include:  Sitting for long periods of time.  Direct trauma to the nerve.  Arthritis of the spine.  Herniated or ruptured disk.  Slipping of the vertebrae (spondylolisthesis).  Pressure from soft tissues, such as muscles or ligament-like tissue (fascia). RISK INCREASES WITH:  Sports that place pressure or stress on the spine (football or weightlifting).  Poor strength and flexibility.  Failure to warm up properly before activity.  Family history of low back pain or disk disorders.  Previous back injury or surgery.  Poor body mechanics, especially when lifting, or poor posture. PREVENTION   Warm up and stretch properly before activity.  Maintain physical fitness:  Strength, flexibility, and endurance.  Cardiovascular fitness.  Learn and use proper technique, especially with posture and lifting. When possible, have coach correct improper technique.  Avoid activities that place stress on the spine. PROGNOSIS If treated properly, then sciatica usually resolves within 6 weeks. However,  occasionally surgery is necessary.  RELATED COMPLICATIONS   Permanent nerve damage, including pain, numbness, tingle, or weakness.  Chronic back pain.  Risks of surgery: infection, bleeding, nerve damage, or damage to surrounding tissues. TREATMENT Treatment initially involves resting from any activities that aggravate your symptoms. The use of ice and medication may help reduce pain and inflammation. The use of strengthening and stretching exercises may help reduce pain with activity. These exercises may be performed at home or with referral to a therapist. A therapist may recommend further treatments, such as transcutaneous electronic nerve stimulation (TENS) or ultrasound. Your caregiver may recommend corticosteroid injections to help reduce inflammation of the sciatic nerve. If symptoms persist despite non-surgical (conservative) treatment, then surgery may be recommended. MEDICATION  If pain medication is necessary, then nonsteroidal anti-inflammatory medications, such as aspirin and ibuprofen, or other minor pain relievers, such as acetaminophen, are often recommended.  Do not take pain medication for 7 days before surgery.  Prescription pain relievers may be given if deemed necessary by your caregiver. Use only as directed and only as much as you need.  Ointments applied to the skin may be helpful.  Corticosteroid injections may be given by your caregiver. These injections should be reserved for the most serious cases, because they may only be given a certain number of times. HEAT AND COLD  Cold treatment (icing) relieves pain and reduces inflammation. Cold treatment should be applied for 10 to 15 minutes every 2 to 3 hours for inflammation and pain and immediately after any activity that aggravates your symptoms. Use ice packs or massage the area with a piece of ice (  ice massage).  Heat treatment may be used prior to performing the stretching and strengthening activities prescribed  by your caregiver, physical therapist, or athletic trainer. Use a heat pack or soak the injury in warm water. SEEK MEDICAL CARE IF:  Treatment seems to offer no benefit, or the condition worsens.  Any medications produce adverse side effects. EXERCISES  RANGE OF MOTION (ROM) AND STRETCHING EXERCISES - Sciatica Most people with sciatic will find that their symptoms worsen with either excessive bending forward (flexion) or arching at the low back (extension). The exercises which will help resolve your symptoms will focus on the opposite motion. Your physician, physical therapist or athletic trainer will help you determine which exercises will be most helpful to resolve your low back pain. Do not complete any exercises without first consulting with your clinician. Discontinue any exercises which worsen your symptoms until you speak to your clinician. If you have pain, numbness or tingling which travels down into your buttocks, leg or foot, the goal of the therapy is for these symptoms to move closer to your back and eventually resolve. Occasionally, these leg symptoms will get better, but your low back pain may worsen; this is typically an indication of progress in your rehabilitation. Be certain to be very alert to any changes in your symptoms and the activities in which you participated in the 24 hours prior to the change. Sharing this information with your clinician will allow him/her to most efficiently treat your condition. These exercises may help you when beginning to rehabilitate your injury. Your symptoms may resolve with or without further involvement from your physician, physical therapist or athletic trainer. While completing these exercises, remember:   Restoring tissue flexibility helps normal motion to return to the joints. This allows healthier, less painful movement and activity.  An effective stretch should be held for at least 30 seconds.  A stretch should never be painful. You should  only feel a gentle lengthening or release in the stretched tissue. FLEXION RANGE OF MOTION AND STRETCHING EXERCISES: STRETCH - Flexion, Single Knee to Chest   Lie on a firm bed or floor with both legs extended in front of you.  Keeping one leg in contact with the floor, bring your opposite knee to your chest. Hold your leg in place by either grabbing behind your thigh or at your knee.  Pull until you feel a gentle stretch in your low back. Hold __________ seconds.  Slowly release your grasp and repeat the exercise with the opposite side. Repeat __________ times. Complete this exercise __________ times per day.  STRETCH - Flexion, Double Knee to Chest  Lie on a firm bed or floor with both legs extended in front of you.  Keeping one leg in contact with the floor, bring your opposite knee to your chest.  Tense your stomach muscles to support your back and then lift your other knee to your chest. Hold your legs in place by either grabbing behind your thighs or at your knees.  Pull both knees toward your chest until you feel a gentle stretch in your low back. Hold __________ seconds.  Tense your stomach muscles and slowly return one leg at a time to the floor. Repeat __________ times. Complete this exercise __________ times per day.  STRETCH - Low Trunk Rotation   Lie on a firm bed or floor. Keeping your legs in front of you, bend your knees so they are both pointed toward the ceiling and your feet are flat on the  floor.  Extend your arms out to the side. This will stabilize your upper body by keeping your shoulders in contact with the floor.  Gently and slowly drop both knees together to one side until you feel a gentle stretch in your low back. Hold for __________ seconds.  Tense your stomach muscles to support your low back as you bring your knees back to the starting position. Repeat the exercise to the other side. Repeat __________ times. Complete this exercise __________ times per  day  EXTENSION RANGE OF MOTION AND FLEXIBILITY EXERCISES: STRETCH - Extension, Prone on Elbows  Lie on your stomach on the floor, a bed will be too soft. Place your palms about shoulder width apart and at the height of your head.  Place your elbows under your shoulders. If this is too painful, stack pillows under your chest.  Allow your body to relax so that your hips drop lower and make contact more completely with the floor.  Hold this position for __________ seconds.  Slowly return to lying flat on the floor. Repeat __________ times. Complete this exercise __________ times per day.  RANGE OF MOTION - Extension, Prone Press Ups  Lie on your stomach on the floor, a bed will be too soft. Place your palms about shoulder width apart and at the height of your head.  Keeping your back as relaxed as possible, slowly straighten your elbows while keeping your hips on the floor. You may adjust the placement of your hands to maximize your comfort. As you gain motion, your hands will come more underneath your shoulders.  Hold this position __________ seconds.  Slowly return to lying flat on the floor. Repeat __________ times. Complete this exercise __________ times per day.  STRENGTHENING EXERCISES - Sciatica  These exercises may help you when beginning to rehabilitate your injury. These exercises should be done near your "sweet spot." This is the neutral, low-back arch, somewhere between fully rounded and fully arched, that is your least painful position. When performed in this safe range of motion, these exercises can be used for people who have either a flexion or extension based injury. These exercises may resolve your symptoms with or without further involvement from your physician, physical therapist or athletic trainer. While completing these exercises, remember:   Muscles can gain both the endurance and the strength needed for everyday activities through controlled exercises.  Complete  these exercises as instructed by your physician, physical therapist or athletic trainer. Progress with the resistance and repetition exercises only as your caregiver advises.  You may experience muscle soreness or fatigue, but the pain or discomfort you are trying to eliminate should never worsen during these exercises. If this pain does worsen, stop and make certain you are following the directions exactly. If the pain is still present after adjustments, discontinue the exercise until you can discuss the trouble with your clinician. STRENGTHENING - Deep Abdominals, Pelvic Tilt   Lie on a firm bed or floor. Keeping your legs in front of you, bend your knees so they are both pointed toward the ceiling and your feet are flat on the floor.  Tense your lower abdominal muscles to press your low back into the floor. This motion will rotate your pelvis so that your tail bone is scooping upwards rather than pointing at your feet or into the floor.  With a gentle tension and even breathing, hold this position for __________ seconds. Repeat __________ times. Complete this exercise __________ times per day.  STRENGTHENING -  Abdominals, Crunches   Lie on a firm bed or floor. Keeping your legs in front of you, bend your knees so they are both pointed toward the ceiling and your feet are flat on the floor. Cross your arms over your chest.  Slightly tip your chin down without bending your neck.  Tense your abdominals and slowly lift your trunk high enough to just clear your shoulder blades. Lifting higher can put excessive stress on the low back and does not further strengthen your abdominal muscles.  Control your return to the starting position. Repeat __________ times. Complete this exercise __________ times per day.  STRENGTHENING - Quadruped, Opposite UE/LE Lift  Assume a hands and knees position on a firm surface. Keep your hands under your shoulders and your knees under your hips. You may place padding  under your knees for comfort.  Find your neutral spine and gently tense your abdominal muscles so that you can maintain this position. Your shoulders and hips should form a rectangle that is parallel with the floor and is not twisted.  Keeping your trunk steady, lift your right hand no higher than your shoulder and then your left leg no higher than your hip. Make sure you are not holding your breath. Hold this position __________ seconds.  Continuing to keep your abdominal muscles tense and your back steady, slowly return to your starting position. Repeat with the opposite arm and leg. Repeat __________ times. Complete this exercise __________ times per day.  STRENGTHENING - Abdominals and Quadriceps, Straight Leg Raise   Lie on a firm bed or floor with both legs extended in front of you.  Keeping one leg in contact with the floor, bend the other knee so that your foot can rest flat on the floor.  Find your neutral spine, and tense your abdominal muscles to maintain your spinal position throughout the exercise.  Slowly lift your straight leg off the floor about 6 inches for a count of 15, making sure to not hold your breath.  Still keeping your neutral spine, slowly lower your leg all the way to the floor. Repeat this exercise with each leg __________ times. Complete this exercise __________ times per day. POSTURE AND BODY MECHANICS CONSIDERATIONS - Sciatica Keeping correct posture when sitting, standing or completing your activities will reduce the stress put on different body tissues, allowing injured tissues a chance to heal and limiting painful experiences. The following are general guidelines for improved posture. Your physician or physical therapist will provide you with any instructions specific to your needs. While reading these guidelines, remember:  The exercises prescribed by your provider will help you have the flexibility and strength to maintain correct postures.  The correct  posture provides the optimal environment for your joints to work. All of your joints have less wear and tear when properly supported by a spine with good posture. This means you will experience a healthier, less painful body.  Correct posture must be practiced with all of your activities, especially prolonged sitting and standing. Correct posture is as important when doing repetitive low-stress activities (typing) as it is when doing a single heavy-load activity (lifting). RESTING POSITIONS Consider which positions are most painful for you when choosing a resting position. If you have pain with flexion-based activities (sitting, bending, stooping, squatting), choose a position that allows you to rest in a less flexed posture. You would want to avoid curling into a fetal position on your side. If your pain worsens with extension-based activities (prolonged standing,  working overhead), avoid resting in an extended position such as sleeping on your stomach. Most people will find more comfort when they rest with their spine in a more neutral position, neither too rounded nor too arched. Lying on a non-sagging bed on your side with a pillow between your knees, or on your back with a pillow under your knees will often provide some relief. Keep in mind, being in any one position for a prolonged period of time, no matter how correct your posture, can still lead to stiffness. PROPER SITTING POSTURE In order to minimize stress and discomfort on your spine, you must sit with correct posture Sitting with good posture should be effortless for a healthy body. Returning to good posture is a gradual process. Many people can work toward this most comfortably by using various supports until they have the flexibility and strength to maintain this posture on their own. When sitting with proper posture, your ears will fall over your shoulders and your shoulders will fall over your hips. You should use the back of the chair to  support your upper back. Your low back will be in a neutral position, just slightly arched. You may place a small pillow or folded towel at the base of your low back for support.  When working at a desk, create an environment that supports good, upright posture. Without extra support, muscles fatigue and lead to excessive strain on joints and other tissues. Keep these recommendations in mind: CHAIR:   A chair should be able to slide under your desk when your back makes contact with the back of the chair. This allows you to work closely.  The chair's height should allow your eyes to be level with the upper part of your monitor and your hands to be slightly lower than your elbows. BODY POSITION  Your feet should make contact with the floor. If this is not possible, use a foot rest.  Keep your ears over your shoulders. This will reduce stress on your neck and low back. INCORRECT SITTING POSTURES   If you are feeling tired and unable to assume a healthy sitting posture, do not slouch or slump. This puts excessive strain on your back tissues, causing more damage and pain. Healthier options include:  Using more support, like a lumbar pillow.  Switching tasks to something that requires you to be upright or walking.  Talking a brief walk.  Lying down to rest in a neutral-spine position. PROLONGED STANDING WHILE SLIGHTLY LEANING FORWARD  When completing a task that requires you to lean forward while standing in one place for a long time, place either foot up on a stationary 2-4 inch high object to help maintain the best posture. When both feet are on the ground, the low back tends to lose its slight inward curve. If this curve flattens (or becomes too large), then the back and your other joints will experience too much stress, fatigue more quickly and can cause pain.  CORRECT STANDING POSTURES Proper standing posture should be assumed with all daily activities, even if they only take a few moments,  like when brushing your teeth. As in sitting, your ears should fall over your shoulders and your shoulders should fall over your hips. You should keep a slight tension in your abdominal muscles to brace your spine. Your tailbone should point down to the ground, not behind your body, resulting in an over-extended swayback posture.  INCORRECT STANDING POSTURES  Common incorrect standing postures include a forward head,  locked knees and/or an excessive swayback. WALKING Walk with an upright posture. Your ears, shoulders and hips should all line-up. PROLONGED ACTIVITY IN A FLEXED POSITION When completing a task that requires you to bend forward at your waist or lean over a low surface, try to find a way to stabilize 3 of 4 of your limbs. You can place a hand or elbow on your thigh or rest a knee on the surface you are reaching across. This will provide you more stability so that your muscles do not fatigue as quickly. By keeping your knees relaxed, or slightly bent, you will also reduce stress across your low back. CORRECT LIFTING TECHNIQUES DO :   Assume a wide stance. This will provide you more stability and the opportunity to get as close as possible to the object which you are lifting.  Tense your abdominals to brace your spine; then bend at the knees and hips. Keeping your back locked in a neutral-spine position, lift using your leg muscles. Lift with your legs, keeping your back straight.  Test the weight of unknown objects before attempting to lift them.  Try to keep your elbows locked down at your sides in order get the best strength from your shoulders when carrying an object.  Always ask for help when lifting heavy or awkward objects. INCORRECT LIFTING TECHNIQUES DO NOT:   Lock your knees when lifting, even if it is a small object.  Bend and twist. Pivot at your feet or move your feet when needing to change directions.  Assume that you cannot safely pick up a paperclip without proper  posture.   This information is not intended to replace advice given to you by your health care provider. Make sure you discuss any questions you have with your health care provider.   Document Released: 12/30/2004 Document Revised: 05/16/2014 Document Reviewed: 04/13/2008 Else[employer] Interactive Patient Education 2016 [employer] Sciatica Sciatica is pain, weakness, numbness, or tingling along the path of the sciatic nerve. The nerve starts in the lower back and runs down the back of each leg. The nerve controls the muscles in the lower leg and in the back of the knee, while also providing sensation to the back of the thigh, lower leg, and the sole of your foot. Sciatica is a symptom of another medical condition. For instance, nerve damage or certain conditions, such as a herniated disk or bone spur on the spine, pinch or put pressure on the sciatic nerve. This causes the pain, weakness, or other sensations normally associated with sciatica. Generally, sciatica only affects one side of the body. CAUSES   Herniated or slipped disc.  Degenerative disk disease.  A pain disorder involving the narrow muscle in the buttocks (piriformis syndrome).  Pelvic injury or fracture.  Pregnancy.  Tumor (rare). SYMPTOMS  Symptoms can vary from mild to very severe. The symptoms usually travel from the low back to the buttocks and down the back of the leg. Symptoms can include:  Mild tingling or dull aches in the lower back, leg, or hip.  Numbness in the back of the calf or sole of the foot.  Burning sensations in the lower back, leg, or hip.  Sharp pains in the lower back, leg, or hip.  Leg weakness.  Severe back pain inhibiting movement. These symptoms may get worse with coughing, sneezing, laughing, or prolonged sitting or standing. Also, being overweight may worsen symptoms. DIAGNOSIS  Your caregiver will perform a physical exam to look for common symptoms of  sciatica. He or she may ask you to  do certain movements or activities that would trigger sciatic nerve pain. Other tests may be performed to find the cause of the sciatica. These may include:  Blood tests.  X-rays.  Imaging tests, such as an MRI or CT scan. TREATMENT  Treatment is directed at the cause of the sciatic pain. Sometimes, treatment is not necessary and the pain and discomfort goes away on its own. If treatment is needed, your caregiver may suggest:  Over-the-counter medicines to relieve pain.  Prescription medicines, such as anti-inflammatory medicine, muscle relaxants, or narcotics.  Applying heat or ice to the painful area.  Steroid injections to lessen pain, irritation, and inflammation around the nerve.  Reducing activity during periods of pain.  Exercising and stretching to strengthen your abdomen and improve flexibility of your spine. Your caregiver may suggest losing weight if the extra weight makes the back pain worse.  Physical therapy.  Surgery to eliminate what is pressing or pinching the nerve, such as a bone spur or part of a herniated disk. HOME CARE INSTRUCTIONS   Only take over-the-counter or prescription medicines for pain or discomfort as directed by your caregiver.  Apply ice to the affected area for 20 minutes, 3-4 times a day for the first 48-72 hours. Then try heat in the same way.  Exercise, stretch, or perform your usual activities if these do not aggravate your pain.  Attend physical therapy sessions as directed by your caregiver.  Keep all follow-up appointments as directed by your caregiver.  Do not wear high heels or shoes that do not provide proper support.  Check your mattress to see if it is too soft. A firm mattress may lessen your pain and discomfort. SEEK IMMEDIATE MEDICAL CARE IF:   You lose control of your bowel or bladder (incontinence).  You have increasing weakness in the lower back, pelvis, buttocks, or legs.  You have redness or swelling of your  back.  You have a burning sensation when you urinate.  You have pain that gets worse when you lie down or awakens you at night.  Your pain is worse than you have experienced in the past.  Your pain is lasting longer than 4 weeks.  You are suddenly losing weight without reason. MAKE SURE YOU:  Understand these instructions.  Will watch your condition.  Will get help right away if you are not doing well or get worse.   This information is not intended to replace advice given to you by your health care provider. Make sure you discuss any questions you have with your health care provider.   Document Released: 12/24/2000 Document Revised: 09/20/2014 Document Reviewed: 05/11/2011 [employer] Interactive Patient Education [employer].

## 2015-03-28 NOTE — Assessment & Plan Note (Signed)
FOBT negative stool cards in January 2017 Mammogram normal October 2016 Due for pelvic exam- patient will schedule with me

## 2015-03-28 NOTE — Assessment & Plan Note (Signed)
Patient improves improvement in depression and anxiety symptoms. She did not fill the Buspirone due to cost, but feels her anxiety has improved. GAD score has improved from 12 to 5 today (mild).   Plan: -Continue Prozac 40 mg daily -Could consider adding buspirone 7.5 BID or Trazodone in the future

## 2015-03-28 NOTE — Assessment & Plan Note (Signed)
Lab Results  Component Value Date   HGBA1C 6.4 01/17/2015   HGBA1C 6.4 10/10/2014     Assessment: Diabetes control: At goal Progress toward A1C goal:   Stable Comments: Diet controlled  Plan: Medications:  none Instruction/counseling given: discussed the need for weight loss, discussed diet and provided printed educational material Other plans: Met with Maria Sims for nutrition counseling

## 2015-03-28 NOTE — Assessment & Plan Note (Signed)
Patient has followed up with Orthopedic surgery who recommends proceeding with replacement of right knee, but also recommends patient focuses on weight loss.

## 2015-03-28 NOTE — Progress Notes (Signed)
Subjective:    Patient ID: Maria Sims, female    DOB: 06-28-1960, 55 y.o.   MRN: [id number]  HPI Maria Sims is a 55 y.o. female with PMHx of HTN, Bilateral Knee OA, and Depression/Anxiety who presents to the clinic for follow up for depression and anxiety. Please see A&P for the status of the patient's chronic medical problems.   Past Medical History  Diagnosis Date  . Tenosynovitis of foot and ankle 09/28/2013  . Hypertension   . Breast cancer [facility])     Outpatient Encounter Prescriptions as of 03/28/2015  Medication Sig Note  . cyclobenzaprine (FLEXERIL) 5 MG tablet Take 1 tablet (5 mg total) by mouth at bedtime.   Marland Kitchen FLUoxetine (PROZAC) 40 MG capsule Take 1 capsule (40 mg total) by mouth daily.   Marland Kitchen lisinopril-hydrochlorothiazide (PRINZIDE,ZESTORETIC) 20-12.5 MG tablet Take 1 tablet by mouth daily.   . naproxen (NAPROSYN) 500 MG tablet Take 1 tablet (500 mg total) by mouth 2 (two) times daily with a meal.   . oxyCODONE-acetaminophen (ROXICET) 5-325 MG tablet Take 1 tablet by mouth every 6 (six) hours as needed for severe pain.   . pravastatin (PRAVACHOL) 40 MG tablet Take 1 tablet (40 mg total) by mouth daily.   Marland Kitchen zolpidem (AMBIEN) 5 MG tablet Take 1 tablet (5 mg total) by mouth at bedtime as needed for sleep.   . [DISCONTINUED] busPIRone (BUSPAR) 7.5 MG tablet Take 1 tablet (7.5 mg total) by mouth 2 (two) times daily.   . [DISCONTINUED] nabumetone (RELAFEN) 500 MG tablet Take 500 mg by mouth. 10/12/2014: Received from: [facility]   No facility-administered encounter medications on file as of 03/28/2015.   Mom: HTN Dad: diabetes  Social History   Social History  . Marital Status: Single    Spouse Name: N/A  . Number of Children: N/A  . Years of Education: N/A   Occupational History  . Not on file.   Social History Main Topics  . Smoking status: Never Smoker   . Smokeless tobacco: Never Used  . Alcohol Use: 0.0 oz/week    0 Standard drinks or equivalent per  week     Comment: Social  . Drug Use: No  . Sexual Activity: Not on file   Other Topics Concern  . Not on file   Social History Narrative   Review of Systems General: Denies fever, chills, fatigue, change in appetite Respiratory: Denies SOB, cough, DOE, chest tightness Cardiovascular: Denies chest pain and palpitations.  Gastrointestinal: Denies abdominal pain, diarrhea, constipation, blood in stool, or stool incontinence. Genitourinary: Denies urinary incontinence Endocrine: Denies polyuria, and polydipsia. Musculoskeletal: Admits to chronic bilateral knee pain, bilateral foot pain, low back pain. Denies myalgias and gait problem.  Neurological: Denies dizziness, headaches, weakness, lightheadedness Psychiatric/Behavioral: Admits to anxiety and depression- improved. Denies sleep disturbance and agitation.    Objective:   Physical Exam Filed Vitals:   03/28/15 1354  BP: 137/81  Pulse: 70  Temp: 97.8 F (36.6 C)  TempSrc: Oral  Height: 5\' 6"  (1.676 m)  Weight: 266 lb 6.4 oz (120.838 kg)  SpO2: 100%   General: Vital signs reviewed.  Patient is obese, in no acute distress and cooperative with exam.  Cardiovascular: RRR, S1 normal, S2 normal, no murmurs, gallops, or rubs. Pulmonary/Chest: Clear to auscultation bilaterally, no wheezes, rales, or rhonchi. Abdominal: Soft, non-tender, non-distended, BS + Musculoskeletal: +Crepitus in bilateral knees. +Low back pain and bilateral SI joint pain.  Extremities: No lower extremity edema bilaterally Neurological: A&O x3,  Strength is normal and symmetric bilaterally, sensory intact to light touch bilaterally.  Skin: Warm, dry and intact.  Psychiatric: Normal mood and affect. speech and behavior is normal. Cognition and memory are normal.     Assessment & Plan:   Please problem based assessment and plan.

## 2015-03-28 NOTE — Telephone Encounter (Signed)
Noted.   We will see her tomorrow in the office.

## 2015-03-28 NOTE — Progress Notes (Signed)
  Medical Nutrition Therapy:  Appt start time: 1500 end time:  1550. Visit # 1 out of 22  Assessment:  Primary concerns today: prediabetes Maria Sims, Maria Sims is here for help with her prediabetes. She says she has started to make some changes to her food intake She talks to her father regularly for support and to learn about how to eat with diabetes. She realizes her depression effects her food intake and she is addressing her depression in healthy ways. We discussed prediabetes classes in the area, their cost  and their advantages, but she wants to do the Diabetes prevention program here in 1:1 sessions.  We also discussed what her a1C is and her risk to developing diabetes even with weight loss as well as the overall benefits fo the program.  She committed to 16 session in the next 6 months and then monthly session for the next 6 months. Preferred Learning Style: No preference indicated  Learning Readiness: ready and Change in progress  ANTHROPOMETRICS: weight-266#, height-66", BMI-43 WEIGHT HISTORY:has lost 3 # by drinking less soda SLEEP:need to assess at future visit MEDICATIONS: noted BLOOD SUGAR:A1C 6.4%, goal is to stay < 6.5%  Usual physical activity: has knee problems- encouraged her to find other activities if she cannot do weight bearing activity  Daily Estimated energy needs for 7% weight loss over next 6-12 months: 2000 calories 55 g fat  Progress Towards Goal(s):  In progress.   Nutritional Diagnosis:  NB-1.1 Food and nutrition-related knowledge deficit As related to lack of prior exposure to Diabetes Prevention Program.  As evidenced by her report and new diagnosis of prediabetes.    Intervention:  Nutrition education about prediabetes. DPP , what it entails and how to keep a food record and how to read a food label, . Coordination of care:   Teaching Method Utilized: Visual, Auditory,Hands on  Handouts given during visit include:DPP booklets, session 1 and food and activity  tracker Barriers to learning/adherence to lifestyle change: finances, emotions Demonstrated degree of understanding via:  Teach Back   Monitoring/Evaluation:  Dietary intake, exercise, and body weight in 1 week(s).

## 2015-03-29 ENCOUNTER — Ambulatory Visit (INDEPENDENT_AMBULATORY_CARE_PROVIDER_SITE_OTHER): Payer: Self-pay | Admitting: Family Medicine

## 2015-03-29 ENCOUNTER — Encounter: Payer: Self-pay | Admitting: Family Medicine

## 2015-03-29 VITALS — BP 140/102 | HR 71 | Ht 66.0 in | Wt 266.0 lb

## 2015-03-29 DIAGNOSIS — M1711 Unilateral primary osteoarthritis, right knee: Secondary | ICD-10-CM

## 2015-03-29 DIAGNOSIS — M25562 Pain in left knee: Secondary | ICD-10-CM

## 2015-03-29 NOTE — Patient Instructions (Signed)
Call me if your knee(s) or wrist worsen and we will evaluate based on what's going on, consider bracing, injection, etc. Otherwise follow up with me as needed! Good luck with the weight loss!

## 2015-03-30 NOTE — Progress Notes (Signed)
Internal Medicine Clinic Attending  Case discussed with Dr. Burns soon after the resident saw the patient.  We reviewed the resident's history and exam and pertinent patient test results.  I agree with the assessment, diagnosis, and plan of care documented in the resident's note. 

## 2015-04-02 NOTE — Progress Notes (Signed)
PCP: Florinda Marker, MD  Subjective:   HPI: Patient is a 55 y.o. female here for bilateral knee pain.  8/29: Patient reports she's had 3 years of bilateral knee pain. Started mainly after working on concrete floors standing for 7 1/2 hours a day 7 days a week. No acute injury or trauma. Difficulty bending, kneeling, getting up after prolonged sitting. Difficulty walking for exercise. Some catching. Remote history of partial meniscectomy of right knee. No locking, giving out.  9/29: Patient returns with 7/10 bilateral knee pain. Achy and constant pain. Diffusely anterior. Problems bending kneeling, after prolonged driving and sitting. No locking, giving out.  12/6: Patient returns for repeat cortisone injection for right knee.  12/29: Patient returns to start [place] series.  01/18/15: Patient returns for second supartz injection. Pain level 5/10.  01/25/15: Patient returns for third supartz injection. Pain improved - 0/10 currently - comes and goes now.  2/9: Patient returns with continued 5/10 level of pain in right knee despite supartz series - did not notice a difference. Interested in injection for left knee given this is also 5/10, anterior, sharp and throbbing. Worse with ambulation. No skin changes, fever.  3/16: Overall patient is doing better. Current pain is down to 0/10. Will be sore with a lot of ambulation. No skin changes, fever.  Past Medical History  Diagnosis Date  . Tenosynovitis of foot and ankle 09/28/2013  . Hypertension   . Breast cancer [facility])     Current Outpatient Prescriptions on File Prior to Visit  Medication Sig Dispense Refill  . cyclobenzaprine (FLEXERIL) 5 MG tablet Take 1 tablet (5 mg total) by mouth at bedtime. 30 tablet 1  . FLUoxetine (PROZAC) 40 MG capsule Take 1 capsule (40 mg total) by mouth daily. 30 capsule 11  . lisinopril-hydrochlorothiazide (PRINZIDE,ZESTORETIC) 20-12.5 MG tablet Take 1 tablet by mouth daily. 30  tablet 11  . naproxen (NAPROSYN) 500 MG tablet Take 1 tablet (500 mg total) by mouth 2 (two) times daily with a meal. 60 tablet 2  . oxyCODONE-acetaminophen (ROXICET) 5-325 MG tablet Take 1 tablet by mouth every 6 (six) hours as needed for severe pain. 60 tablet 0  . pravastatin (PRAVACHOL) 40 MG tablet Take 1 tablet (40 mg total) by mouth daily. 30 tablet 11  . zolpidem (AMBIEN) 5 MG tablet Take 1 tablet (5 mg total) by mouth at bedtime as needed for sleep. 30 tablet 5   No current facility-administered medications on file prior to visit.    Past Surgical History  Procedure Laterality Date  . Bunionectomy Left 01/26/2014    @ [facility]  . Cotton osteotomy w/ bone graft Left 01/26/2014    @ [place]  . Neck surgery    . Cholecystectomy    . Breast lumpectomy    . Breast reconstruction    . Knee surgery    . Abdominal hysterectomy      Allergies  Allergen Reactions  . Pineapple     Social History   Social History  . Marital Status: Single    Spouse Name: N/A  . Number of Children: N/A  . Years of Education: N/A   Occupational History  . Not on file.   Social History Main Topics  . Smoking status: Never Smoker   . Smokeless tobacco: Never Used  . Alcohol Use: 0.0 oz/week    0 Standard drinks or equivalent per week     Comment: Social  . Drug Use: No  . Sexual Activity: Not  on file   Other Topics Concern  . Not on file   Social History Narrative    Family History  Problem Relation Age of Onset  . Hypertension Mother   . Diabetes Father     BP 140/102 mmHg  Pulse 71  Ht 5\' 6"  (1.676 m)  Wt 266 lb (120.657 kg)  BMI 42.95 kg/m2  Review of Systems: See HPI above.    Objective:  Physical Exam:  Gen: NAD, comfortable in exam room.  Bilateral knees: No gross deformity, ecchymoses, effusions. Minimal TTP medial joint lines. FROM. Negative ant/post drawers. Negative valgus/varus testing. Negative lachmanns. Negative mcmurrays, apleys. NV  intact distally.    Assessment & Plan:  1. 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  She does have a small lateral meniscus tear but given degree of her arthritis her pain is more likely due to the DJD.  Cortisone and supartz series without benefit unfortunately though feeling well today.  Dr. Erlinda Hong discussed replacement surgery with her - patient asked to lose 15 pounds before considering surgical intervention.    2. Left knee pain - likely with similar amount of DJD here though right knee more symptomatic.  Improved today following intraarticular injection last visit.

## 2015-04-02 NOTE — Assessment & Plan Note (Signed)
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  She does have a small lateral meniscus tear but given degree of her arthritis her pain is more likely due to the DJD.  Cortisone and supartz series without benefit unfortunately though feeling well today.  Dr. Erlinda Hong discussed replacement surgery with her - patient asked to lose 15 pounds before considering surgical intervention.

## 2015-04-02 NOTE — Assessment & Plan Note (Signed)
likely with similar amount of DJD here though right knee more symptomatic.  Improved today following intraarticular injection last visit.

## 2015-04-03 ENCOUNTER — Ambulatory Visit: Payer: Self-pay | Admitting: Dietician

## 2015-04-04 ENCOUNTER — Ambulatory Visit: Payer: Self-pay | Admitting: Dietician

## 2015-04-06 ENCOUNTER — Encounter: Payer: Self-pay | Admitting: Dietician

## 2015-04-06 ENCOUNTER — Ambulatory Visit (INDEPENDENT_AMBULATORY_CARE_PROVIDER_SITE_OTHER): Payer: Self-pay | Admitting: Dietician

## 2015-04-06 VITALS — Wt 269.2 lb

## 2015-04-06 DIAGNOSIS — Z713 Dietary counseling and surveillance: Secondary | ICD-10-CM

## 2015-04-06 DIAGNOSIS — R7303 Prediabetes: Secondary | ICD-10-CM

## 2015-04-06 NOTE — Patient Instructions (Signed)
For the next week:  Keep track of what you eat and drink.    Write down everything your eat in the Food and Activity Tracker    Write down the fat grams you eat in the Food and Activity Tracker    Keep a running fat gram total throughout the day   Come as close to the fat gram goal as you can.   Bring Armed forces training and education officer  And your notebook with you next week.   Butch Penny [phone]

## 2015-04-06 NOTE — Progress Notes (Signed)
  Medical Nutrition Therapy:  Appt start time: 1430 end time:  [id number]. Visit # 2 out of 22  Assessment:  Primary concerns today: prediabetes treatment Maria Sims, Maria Sims is here for help with her prediabetes. Session 2 of the DPP curriculum was covered  She committed to 16 session in the next 6 months and then monthly session for the next 6 months.   ANTHROPOMETRICS:    Session 1 Session 2  Activity minute/week  90  weight 266 269   SLEEP:need to assess at future visit BLOOD SUGAR:A1C 6.4%, goal is to stay < 6.5%   Progress Towards Goal(s):  In progress.   Nutritional Diagnosis:  NB-1.1 Food and nutrition-related knowledge deficit As related to lack of prior exposure to Diabetes Prevention Program improving  As evidenced by her report and questions.    Intervention:  Nutrition education about lifestyle/behavior change and skills taught per the DPP curriculum. .   Teaching Method Utilized: Visual, Auditory,Hands on  Handouts given during visit include:DPP booklets, session 2 and food and activity tracker Barriers to learning/adherence to lifestyle change: finances, emotions Demonstrated degree of understanding via:  Teach Back   Monitoring/Evaluation:  Dietary intake, exercise, and body weight in 1 week(s).

## 2015-04-11 ENCOUNTER — Ambulatory Visit: Payer: Self-pay | Admitting: Dietician

## 2015-04-13 ENCOUNTER — Encounter: Payer: Self-pay | Admitting: Dietician

## 2015-04-13 ENCOUNTER — Ambulatory Visit (INDEPENDENT_AMBULATORY_CARE_PROVIDER_SITE_OTHER): Payer: Self-pay | Admitting: Dietician

## 2015-04-13 VITALS — Wt 266.2 lb

## 2015-04-13 DIAGNOSIS — R7303 Prediabetes: Secondary | ICD-10-CM

## 2015-04-13 DIAGNOSIS — Z713 Dietary counseling and surveillance: Secondary | ICD-10-CM

## 2015-04-13 MED FILL — NAPROXEN 500 MG TABLET: 500 | 30 days supply | Qty: 60 | Fill #0

## 2015-04-13 NOTE — Progress Notes (Signed)
  Medical Nutrition Therapy:  Appt start time: [id number] end time:  1140. Visit # 3 out of 22  Assessment:  Primary concerns today: prediabetes treatment Maria Sims, Maria Sims is here for help with her prediabetes. Session 3 of the DPP curriculum was covered. This covered measuring, menu ,ake over and 3 ways to lower fat and calories.  She brought her food record that was mostly completed. We discussed barriers to behavior change.   ANTHROPOMETRICS:    Session 1 Session 2 Session 3  Activity minute/week  90 90  weight 266 269 266.2   SLEEP:need to assess at future visit BLOOD SUGAR:A1C 6.4%, goal is to stay < 6.5%   Progress Towards Goal(s):  In progress.   Nutritional Diagnosis:  NB-1.1 Food and nutrition-related knowledge deficit As related to lack of prior exposure to Diabetes Prevention Program improving  As evidenced by her report and questions.    Intervention:  Nutrition education about lifestyle/behavior change and skills taught per the DPP curriculum. .   Teaching Method Utilized: Visual, Auditory,Hands on  Handouts given during visit include:DPP booklets, session 2 and food and activity tracker Barriers to learning/adherence to lifestyle change: finances, emotions Demonstrated degree of understanding via:  Teach Back   Monitoring/Evaluation:  Dietary intake, exercise, and body weight in 1 week(s).

## 2015-04-18 ENCOUNTER — Ambulatory Visit: Payer: Self-pay | Admitting: Dietician

## 2015-04-19 ENCOUNTER — Ambulatory Visit: Payer: Self-pay | Admitting: Dietician

## 2015-04-19 ENCOUNTER — Encounter: Payer: Self-pay | Admitting: Family Medicine

## 2015-04-19 ENCOUNTER — Ambulatory Visit (INDEPENDENT_AMBULATORY_CARE_PROVIDER_SITE_OTHER): Payer: Self-pay | Admitting: Family Medicine

## 2015-04-19 VITALS — BP 144/83 | HR 69 | Ht 66.0 in | Wt 266.0 lb

## 2015-04-19 DIAGNOSIS — M79601 Pain in right arm: Secondary | ICD-10-CM

## 2015-04-19 DIAGNOSIS — M79641 Pain in right hand: Secondary | ICD-10-CM

## 2015-04-19 NOTE — Patient Instructions (Signed)
We will go ahead with nerve conduction studies of your right arm. We will also look into if [facility] has a program similar to the letter/orange card for you to see an orthopedic.

## 2015-04-20 ENCOUNTER — Ambulatory Visit (INDEPENDENT_AMBULATORY_CARE_PROVIDER_SITE_OTHER): Payer: Self-pay | Admitting: Dietician

## 2015-04-20 DIAGNOSIS — R7303 Prediabetes: Secondary | ICD-10-CM

## 2015-04-20 DIAGNOSIS — Z713 Dietary counseling and surveillance: Secondary | ICD-10-CM

## 2015-04-20 NOTE — Assessment & Plan Note (Signed)
Distribution is unusual - has evidence of DeQuervains, extensor tendon strain.  Also has numbness in thumb.  Difficult to point to a single cause and not improving with bracing, occupational therapy.  Advised we go ahead with nerve conduction studies/EMGs to assess for neuropathy that may account for all symptoms.

## 2015-04-20 NOTE — Progress Notes (Signed)
PCP: Florinda Marker, MD  Subjective:   HPI: Patient is a 55 y.o. female here for right hand pain.  10/20: Patient denies known injury. She has prior history of carpal tunnel syndrome. Past 3 days she reports mostly dorsal pain in right hand radiating up the forearm. Associated with some swelling, burning from thumb into middle digit. Right handed. Pain level 6/10. No left sided symptoms now. No skin changes, fever, other complaints.  11/10: Patient reports she's only slightly improved from last visit from right hand pain. Reports pain is 5/10, sharp and constant. Radiates up her whole arm. Takes nabumetone, wearing wrist brace. Associated burning dorsal hand into forearm still with numbness. No fever, skin changes, other complaints.  12/6: Patient returns with continued 6/10 level of pain in right wrist. Has been using brace. Not heard from occupational therapy (notes from them indicate they called her but she did not receive the message). No skin changes, fever, other complaints. No changes from last visit otherwise.  04/19/15: Patient has completed occupational therapy and doing home exercises. She continues to struggle though with pain at 6/10 level in wrist, thumb, dull and constant. Associated numbness only in the thumb. No new injuries. Worse with any motions of the thumb. No skin changes, fever.  Past Medical History  Diagnosis Date  . Tenosynovitis of foot and ankle 09/28/2013  . Hypertension   . Breast cancer [facility])     Current Outpatient Prescriptions on File Prior to Visit  Medication Sig Dispense Refill  . cyclobenzaprine (FLEXERIL) 5 MG tablet Take 1 tablet (5 mg total) by mouth at bedtime. 30 tablet 1  . FLUoxetine (PROZAC) 40 MG capsule Take 1 capsule (40 mg total) by mouth daily. 30 capsule 11  . lisinopril-hydrochlorothiazide (PRINZIDE,ZESTORETIC) 20-12.5 MG tablet Take 1 tablet by mouth daily. 30 tablet 11  . naproxen (NAPROSYN) 500 MG tablet Take 1  tablet (500 mg total) by mouth 2 (two) times daily with a meal. 60 tablet 2  . oxyCODONE-acetaminophen (ROXICET) 5-325 MG tablet Take 1 tablet by mouth every 6 (six) hours as needed for severe pain. 60 tablet 0  . pravastatin (PRAVACHOL) 40 MG tablet Take 1 tablet (40 mg total) by mouth daily. 30 tablet 11  . zolpidem (AMBIEN) 5 MG tablet Take 1 tablet (5 mg total) by mouth at bedtime as needed for sleep. 30 tablet 5   No current facility-administered medications on file prior to visit.    Past Surgical History  Procedure Laterality Date  . Bunionectomy Left 01/26/2014    @ [facility]  . Cotton osteotomy w/ bone graft Left 01/26/2014    @ [place]  . Neck surgery    . Cholecystectomy    . Breast lumpectomy    . Breast reconstruction    . Knee surgery    . Abdominal hysterectomy      Allergies  Allergen Reactions  . Pineapple     Social History   Social History  . Marital Status: Single    Spouse Name: N/A  . Number of Children: N/A  . Years of Education: N/A   Occupational History  . Not on file.   Social History Main Topics  . Smoking status: Never Smoker   . Smokeless tobacco: Never Used  . Alcohol Use: 0.0 oz/week    0 Standard drinks or equivalent per week     Comment: Social  . Drug Use: No  . Sexual Activity: Not on file   Other Topics Concern  .  Not on file   Social History Narrative    Family History  Problem Relation Age of Onset  . Hypertension Mother   . Diabetes Father     BP 144/83 mmHg  Pulse 69  Ht 5\' 6"  (1.676 m)  Wt 266 lb (120.657 kg)  BMI 42.95 kg/m2  Review of Systems: See HPI above.    Objective:  Physical Exam:  Gen: NAD  Right hand/wrist: No gross deformity, swelling, bruising. TTP dorsally over extensor tendons, 1st dorsal compartment.  No tenderness at carpal tunnel.  No other tenderness. FROM all digits with 5/5 strength. Collateral ligaments intact. Negative tinels and phalens. Sensation diminished only  throughout thumb.  Left wrist/hand: FROM digits and wrist without pain.    Assessment & Plan:  1. Right hand/arm pain - Distribution is unusual - has evidence of DeQuervains, extensor tendon strain.  Also has numbness in thumb.  Difficult to point to a single cause and not improving with bracing, occupational therapy.  Advised we go ahead with nerve conduction studies/EMGs to assess for neuropathy that may account for all symptoms.

## 2015-04-20 NOTE — Progress Notes (Signed)
Medical Nutrition Therapy:  Appt start time: 1100 end time:  1205. Visit # 4 out of 22  Assessment:  Primary concerns today: prediabetes treatment Maria Sims, Maria Sims is here for help with her prediabetes. Session 4 of the DPP curriculum was covered. This covered measuring, menu ,ake over and 3 ways to lower fat and calories.  She forgot her food and activity tracker. She is having knee and back pain this week. Encouraged her to not skip meals and to eat more- up to her allotted 1200-1500 calories and 55 grams of fat per day.   ANTHROPOMETRICS:   Session 1 Session 2 Session 3 Session 4  Activity minute/week  90 90 60  weight 266 269 266.2 267.2   SLEEP:need to assess at future visit BLOOD SUGAR:A1C 6.4%, goal is to stay < 6.5%  Progress Towards Goal(s):  In progress.   Nutritional Diagnosis:  NB-1.1 Food and nutrition-related knowledge deficit As related to lack of prior exposure to Diabetes Prevention Program improving  As evidenced by her report and questions.    Intervention:  Nutrition education about lifestyle/behavior change and skills taught per the DPP curriculum. .   Teaching Method Utilized: Visual, Auditory,Hands on  Handouts given during visit include. Session 3 tracker with comments from RD, session 4 handoutsand food and activity tracker Barriers to learning/adherence to lifestyle change: finances, emotions Demonstrated degree of understanding via:  Teach Back   Monitoring/Evaluation:  Dietary intake, exercise, and body weight in 1 week(s).

## 2015-04-20 NOTE — Patient Instructions (Signed)
For the next week:  Keep track of what you eat and drink.   Write down everything your eat in the Food and Activity Tracker   Write down the fat grams you eat in the Food and Activity Tracker   Keep a running fat gram total throughout the day  Come as close to the fat gram goal as you can.   Bring Armed forces training and education officer And your notebook with you next week.   Butch Penny [phone]

## 2015-04-25 ENCOUNTER — Ambulatory Visit: Payer: Self-pay | Admitting: Dietician

## 2015-05-02 ENCOUNTER — Ambulatory Visit: Payer: Self-pay | Admitting: Dietician

## 2015-05-02 ENCOUNTER — Telehealth: Payer: Self-pay | Admitting: Dietician

## 2015-05-02 NOTE — Telephone Encounter (Signed)
Patient missed two Diabetes prevention program appointments without calling. Attempted to call her to let he know that her future appointment are being cancelled and she can reschedule when she is ready and as needed. She can be referred to the [facility] DPP for a group situation/support. This was dicussed with her at a previous visit. Unable to reach her or leave a message on either number today.

## 2015-05-08 ENCOUNTER — Other Ambulatory Visit: Payer: Self-pay | Admitting: Internal Medicine

## 2015-05-08 ENCOUNTER — Telehealth: Payer: Self-pay | Admitting: Internal Medicine

## 2015-05-08 NOTE — Telephone Encounter (Signed)
APPT. REMINDER CALL, NO ANSWER ON EITHER NUMBER, NO VOICEMAIL

## 2015-05-09 ENCOUNTER — Ambulatory Visit (INDEPENDENT_AMBULATORY_CARE_PROVIDER_SITE_OTHER): Payer: Self-pay | Admitting: Internal Medicine

## 2015-05-09 ENCOUNTER — Ambulatory Visit: Payer: Self-pay | Admitting: Dietician

## 2015-05-09 VITALS — BP 150/88 | HR 62 | Temp 98.0°F | Ht 66.0 in | Wt 268.5 lb

## 2015-05-09 DIAGNOSIS — Z01419 Encounter for gynecological examination (general) (routine) without abnormal findings: Secondary | ICD-10-CM | POA: Insufficient documentation

## 2015-05-09 DIAGNOSIS — Z90711 Acquired absence of uterus with remaining cervical stump: Secondary | ICD-10-CM

## 2015-05-09 NOTE — Progress Notes (Signed)
Case discussed with Dr. Burns soon after the resident saw the patient. We reviewed the resident's history and exam and pertinent patient test results. I agree with the assessment, diagnosis, and plan of care documented in the resident's note. 

## 2015-05-09 NOTE — Patient Instructions (Signed)
We will contact you with your pap smear results. Continue current medications. Please follow up in 6 months.

## 2015-05-09 NOTE — Progress Notes (Addendum)
   Subjective:    Patient ID: Maria Sims, female    DOB: 11/20/1960, 55 y.o.   MRN: [id number]  HPI Maria Sims is a 55 y.o. female with PMHx of HTN, Depression, HLD who presents to the clinic for her routine pap smear. Please see A&P for the status of the patient's chronic medical problems.   Past Medical History  Diagnosis Date  . Tenosynovitis of foot and ankle 09/28/2013  . Hypertension   . Breast cancer [facility])     Outpatient Encounter Prescriptions as of 05/09/2015  Medication Sig  . FLUoxetine (PROZAC) 40 MG capsule Take 1 capsule (40 mg total) by mouth daily.  Marland Kitchen lisinopril-hydrochlorothiazide (PRINZIDE,ZESTORETIC) 20-12.5 MG tablet Take 1 tablet by mouth daily.  . naproxen (NAPROSYN) 500 MG tablet Take 1 tablet (500 mg total) by mouth 2 (two) times daily with a meal.  . pravastatin (PRAVACHOL) 40 MG tablet Take 1 tablet (40 mg total) by mouth daily.  Marland Kitchen zolpidem (AMBIEN) 5 MG tablet Take 1 tablet (5 mg total) by mouth at bedtime as needed for sleep.   No facility-administered encounter medications on file as of 05/09/2015.    Family History  Problem Relation Age of Onset  . Hypertension Mother   . Diabetes Father     Social History   Social History  . Marital Status: Single    Spouse Name: N/A  . Number of Children: N/A  . Years of Education: N/A   Occupational History  . Not on file.   Social History Main Topics  . Smoking status: Never Smoker   . Smokeless tobacco: Never Used  . Alcohol Use: 0.0 oz/week    0 Standard drinks or equivalent per week     Comment: Social  . Drug Use: No  . Sexual Activity: Not on file   Other Topics Concern  . Not on file   Social History Narrative    Review of Systems General: Denies fever, chills, fatigue, change in appetite  Respiratory: Denies SOB, DOE   Cardiovascular: Denies chest pain and palpitations.  Gastrointestinal: Denies nausea, vomiting, abdominal pain Genitourinary: Denies dysuria, urgency,  frequency, vaginal itching, odor or discharge.  Neurological: Denies dizziness, headaches, weakness, lightheadedness     Objective:   Physical Exam Filed Vitals:   05/09/15 1327  BP: 150/88  Pulse: 62  Temp: 98 F (36.7 C)  TempSrc: Oral  Height: 5\' 6"  (1.676 m)  Weight: 268 lb 8 oz (121.791 kg)  SpO2: 100%   General: Vital signs reviewed.  Patient is well-developed and well-nourished, in no acute distress and cooperative with exam.  Cardiovascular: RRR, S1 normal, S2 normal, no murmurs, gallops, or rubs. Pulmonary/Chest: Clear to auscultation bilaterally, no wheezes, rales, or rhonchi. Abdominal: Soft, non-tender, non-distended VULVA: normal appearing vulva with no masses, tenderness or lesions VAGINA: normal appearing vagina with normal color and discharge, no lesions CERVIX: normal appearing cervix without discharge or lesions ADNEXA: normal adnexa in size, nontender and no masses, unable to palpate PAP: Pap smear done today, exam chaperoned by Lela Sturdivant.     Assessment & Plan:   Please see problem based assessment and plan.

## 2015-05-09 NOTE — Assessment & Plan Note (Addendum)
Patient comes in today for a  pap smear. Patient has a history of a supracervical hysterectomy in the year 2000. Previous abnormal Pap smears: no. Contraception: none. She denies any complaints of bloating, early satiety, vaginal discharge, itching, abnormal odor or vaginal bleeding.   Pelvic Exam: cervix normal in appearance, external genitalia normal and vagina normal without discharge. Pap smear obtained.    Follow up in 3 years, or as indicated by Pap results.

## 2015-05-11 LAB — CYTOLOGY - PAP

## 2015-05-16 ENCOUNTER — Ambulatory Visit: Payer: Self-pay | Admitting: Dietician

## 2015-05-23 ENCOUNTER — Ambulatory Visit: Payer: Self-pay | Admitting: Dietician

## 2015-05-30 ENCOUNTER — Telehealth: Payer: Self-pay | Admitting: Internal Medicine

## 2015-05-30 ENCOUNTER — Ambulatory Visit (INDEPENDENT_AMBULATORY_CARE_PROVIDER_SITE_OTHER): Payer: Self-pay | Admitting: Neurology

## 2015-05-30 ENCOUNTER — Ambulatory Visit: Payer: Self-pay | Admitting: Dietician

## 2015-05-30 DIAGNOSIS — Z0289 Encounter for other administrative examinations: Secondary | ICD-10-CM

## 2015-05-30 DIAGNOSIS — G5601 Carpal tunnel syndrome, right upper limb: Secondary | ICD-10-CM

## 2015-05-30 NOTE — Telephone Encounter (Signed)
APT. REMINDER CALL, LMTCB °

## 2015-05-31 ENCOUNTER — Ambulatory Visit: Payer: Self-pay | Admitting: Internal Medicine

## 2015-06-01 DIAGNOSIS — G5601 Carpal tunnel syndrome, right upper limb: Secondary | ICD-10-CM | POA: Insufficient documentation

## 2015-06-01 NOTE — Progress Notes (Signed)
Electrodiagnostic study today demonstrate moderate to severe right carpal tunnel syndromes, demyelinating nature. There is no evidence of axonal loss.

## 2015-06-01 NOTE — Procedures (Signed)
   NCS (NERVE CONDUCTION STUDY) WITH EMG (ELECTROMYOGRAPHY) REPORT   STUDY DATE: Jun 01 2015 PATIENT NAME: Maria Sims DOB: 1960-11-04 MRN: [id number]    TECHNOLOGIST: Laretta Alstrom  ELECTROMYOGRAPHER: Marcial Pacas M.D.  CLINICAL INFORMATION: 55 years old right-handed female, presented with few years history of right hand paresthesia, gradually getting worse, subjective weakness  On examination: She has mild right abductor pollicis brevis, right opponens weakness. There was decreased pinprick at right first 3 finger pad, right wrist Tinel sign was present.  FINDINGS: NERVE CONDUCTION STUDY: Right ulnar sensory and motor responses were normal. Right median sensory response showed moderately prolonged peak latency, with mildly decreased snap amplitude. Right median motor responses showed moderate to severely prolonged distal latency, and prolonged F-wave latency. Right median motor C map amplitude was within normal limits, conduction velocity was normal.  NEEDLE ELECTROMYOGRAPHY: Selective needle examinations were performed at right upper extremity muscles and right cervical paraspinal muscles.  Needle examination of right pronator teres, biceps, triceps, deltoid, extensor digitorum communis first dorsal interossei was normal.  Right abductor pollicis brevis: Increased insertional activity, no spontaneous activity, mild enlargement motor unit potential was mildly decreased recruitment patterns.  IMPRESSION:   This is an abnormal study. There is electrodiagnostic evidence of moderate to severe right carpal tunnel syndrome, demyelinating in nature. There is no evidence of right cervical radiculopathy.   INTERPRETING PHYSICIAN:   Marcial Pacas M.D. Ph.D. [facility] [address], [place], [place] [address] [phone]

## 2015-06-06 ENCOUNTER — Encounter: Payer: Self-pay | Admitting: Internal Medicine

## 2015-06-06 ENCOUNTER — Ambulatory Visit: Payer: Self-pay | Admitting: Dietician

## 2015-06-06 ENCOUNTER — Ambulatory Visit (INDEPENDENT_AMBULATORY_CARE_PROVIDER_SITE_OTHER): Payer: Self-pay | Admitting: Internal Medicine

## 2015-06-06 VITALS — BP 132/86 | HR 75 | Temp 98.4°F | Ht 66.0 in | Wt 268.9 lb

## 2015-06-06 DIAGNOSIS — G2581 Restless legs syndrome: Secondary | ICD-10-CM

## 2015-06-06 DIAGNOSIS — Z0000 Encounter for general adult medical examination without abnormal findings: Secondary | ICD-10-CM

## 2015-06-06 DIAGNOSIS — G5601 Carpal tunnel syndrome, right upper limb: Secondary | ICD-10-CM

## 2015-06-06 LAB — GLUCOSE, CAPILLARY: GLUCOSE-CAPILLARY: 101 mg/dL — AB (ref 65–99)

## 2015-06-06 LAB — POCT GLYCOSYLATED HEMOGLOBIN (HGB A1C): Hemoglobin A1C: 6.3

## 2015-06-06 MED ORDER — GABAPENTIN 300 MG PO CAPS: 300.0000 mg | ORAL_CAPSULE | Freq: Every day | ORAL | Status: DC

## 2015-06-06 NOTE — Progress Notes (Signed)
   Subjective:   Patient ID: Maria Sims female   DOB: 12/18/1960 55 y.o.   MRN: [id number]  HPI: [name] is a 56 y.o. female w/ PMHx of HTN and h/o Breast CA, presents to the clinic today for an acute visit for arm burning. Patient has a known h/o right carpal tunnel syndrome that has continued to cause her significant discomfort. Patient has had steroid injections in this area, wears a splint regularly, and still has severe pain and burning in the right wrist. She recently had EMG studies that suggested severe carpal tunnel syndrome, demyelinating in nature, with no evidence of cervical radiculopathy. The patient also complains of leg discomfort when she goes to bed, which she describes as an anxious feeling where she can't keep her legs still. Sometimes this is accompanied by some mild aching, dull pain as well.   Past Medical History  Diagnosis Date  . Tenosynovitis of foot and ankle 09/28/2013  . Hypertension   . Breast cancer [facility])    Current Outpatient Prescriptions  Medication Sig Dispense Refill  . FLUoxetine (PROZAC) 40 MG capsule Take 1 capsule (40 mg total) by mouth daily. 30 capsule 11  . gabapentin (NEURONTIN) 300 MG capsule Take 1 capsule (300 mg total) by mouth at bedtime. 30 capsule 2  . lisinopril-hydrochlorothiazide (PRINZIDE,ZESTORETIC) 20-12.5 MG tablet Take 1 tablet by mouth daily. 30 tablet 11  . naproxen (NAPROSYN) 500 MG tablet Take 1 tablet (500 mg total) by mouth 2 (two) times daily with a meal. 60 tablet 2  . pravastatin (PRAVACHOL) 40 MG tablet Take 1 tablet (40 mg total) by mouth daily. 30 tablet 11  . zolpidem (AMBIEN) 5 MG tablet Take 1 tablet (5 mg total) by mouth at bedtime as needed for sleep. 30 tablet 5   No current facility-administered medications for this visit.    Review of Systems: General: Denies fever, chills, diaphoresis, appetite change and fatigue.  Respiratory: Denies SOB, DOE, cough, and wheezing.   Cardiovascular: Denies  chest pain and palpitations.  Gastrointestinal: Denies nausea, vomiting, abdominal pain, and diarrhea.  Genitourinary: Denies dysuria, increased frequency, and flank pain. Endocrine: Denies hot or cold intolerance, polyuria, and polydipsia. Musculoskeletal: Positive for restless legs, right wrist pain. Denies myalgias, back pain, joint swelling, and gait problem.  Skin: Denies pallor, rash and wounds.  Neurological: Denies dizziness, seizures, syncope, weakness, lightheadedness, numbness and headaches.  Psychiatric/Behavioral: Denies mood changes, and sleep disturbances.  Objective:   Physical Exam: Filed Vitals:   06/06/15 1034  BP: 132/86  Pulse: 75  Temp: 98.4 F (36.9 C)  TempSrc: Oral  Height: 5\' 6"  (1.676 m)  Weight: 268 lb 14.4 oz (121.972 kg)  SpO2: 99%    General: Alert, cooperative, NAD. HEENT: PERRL, EOMI. Moist mucus membranes Neck: Full range of motion without pain, supple, no lymphadenopathy or carotid bruits Lungs: Clear to ascultation bilaterally, normal work of respiration, no wheezes, rales, rhonchi Heart: RRR, no murmurs, gallops, or rubs Abdomen: Soft, non-tender, non-distended, BS + Extremities: No cyanosis, clubbing, or edema. Right wrist splint.  Neurologic: Alert & oriented X3, cranial nerves II-XII intact, strength intact although right had grip 4/5. Sensation intact to light touch   Assessment & Plan:   Please see problem based assessment and plan.

## 2015-06-06 NOTE — Patient Instructions (Signed)
1. Please make a follow up appointment for 4-6 weeks.   We will place referral for hand surgery. You will likely have to go to [facility] for this.   2. Please take all medications as previously prescribed with the following changes:  Start taking Neurontin 300 mg once at bed time.   3. If you have worsening of your symptoms or new symptoms arise, please call the clinic [phone] or go to the ER immediately if symptoms are severe.  You have done a great job in taking all your medications. Please continue to do this.

## 2015-06-07 LAB — CBC WITH DIFFERENTIAL/PLATELET
Basophils Absolute: 0 10*3/uL (ref 0.0–0.2)
Basos: 0 %
EOS (ABSOLUTE): 0.1 10*3/uL (ref 0.0–0.4)
EOS: 1 %
HEMATOCRIT: 35.5 % (ref 34.0–46.6)
HEMOGLOBIN: 11.2 g/dL (ref 11.1–15.9)
Immature Grans (Abs): 0 10*3/uL (ref 0.0–0.1)
Immature Granulocytes: 0 %
LYMPHS ABS: 2.4 10*3/uL (ref 0.7–3.1)
Lymphs: 44 %
MCH: 24.2 pg — AB (ref 26.6–33.0)
MCHC: 31.5 g/dL (ref 31.5–35.7)
MCV: 77 fL — ABNORMAL LOW (ref 79–97)
MONOCYTES: 5 %
MONOS ABS: 0.3 10*3/uL (ref 0.1–0.9)
NEUTROS ABS: 2.7 10*3/uL (ref 1.4–7.0)
Neutrophils: 50 %
Platelets: 306 10*3/uL (ref 150–379)
RBC: 4.63 x10E6/uL (ref 3.77–5.28)
RDW: 15.4 % (ref 12.3–15.4)
WBC: 5.4 10*3/uL (ref 3.4–10.8)

## 2015-06-07 LAB — FERRITIN: Ferritin: 142 ng/mL (ref 15–150)

## 2015-06-07 NOTE — Assessment & Plan Note (Signed)
Patient has recent complaints of RLS. Hb 11.2, ferritin normal (142).  -Try Neurontin 300 mg qhs. This may also help somewhat with the neuropathic pain she is experiencing from her wrist as well.

## 2015-06-07 NOTE — Assessment & Plan Note (Addendum)
Patient with continued pain in the right wrist despite steroid injections, splinting, and NSAIDS. Recent EMG studies show severe carpal tunnel syndrome, demyelinating in nature. No cervical radiculopathy. Patient does have decreased grip strength on the right wrist. Previous TSH normal. HbA1c borderline, repeat 6.3.  -Referral to hand surgery (will likely need to go to [facility] as she has the orange card)

## 2015-06-07 NOTE — Assessment & Plan Note (Signed)
Patient says she recently had Hep C and HIV checked at a health fair, said they were negative. Also recently did stool cards which were negative. Does have recent complaints of RLS, therefore iron deficiency anemia was suspected, however, Hb is low normal at 11.2. Ferritin 142.

## 2015-06-07 NOTE — Progress Notes (Signed)
Case discussed with Dr. Ronnald Ramp soon after the resident saw the patient.  We reviewed the resident's history and exam and pertinent patient test results.  I agree with the assessment, diagnosis and plan of care documented in the resident's note.

## 2015-06-08 ENCOUNTER — Other Ambulatory Visit: Payer: Self-pay | Admitting: Internal Medicine

## 2015-06-13 ENCOUNTER — Ambulatory Visit: Payer: Self-pay | Admitting: Dietician

## 2015-06-14 ENCOUNTER — Telehealth: Payer: Self-pay | Admitting: *Deleted

## 2015-06-14 NOTE — Telephone Encounter (Signed)
Patient called to see if you have received notes from nerve conduction study.

## 2015-06-15 MED FILL — GABAPENTIN 300 MG CAPSULE: 300 | 30 days supply | Qty: 30 | Fill #0

## 2015-06-18 NOTE — Telephone Encounter (Signed)
Called and left voicemail again.

## 2015-06-18 NOTE — Telephone Encounter (Signed)
Spoke with patient.  She is being referred to [facility] for probable carpal tunnel release.  Advised her to follow up on referral.  She will follow up with [place] as needed.

## 2015-06-18 NOTE — Telephone Encounter (Signed)
Called and left a message for her on 6/2.  Will try her again.

## 2015-06-20 ENCOUNTER — Ambulatory Visit: Payer: Self-pay | Admitting: Dietician

## 2015-06-25 ENCOUNTER — Ambulatory Visit: Payer: Self-pay

## 2015-06-27 ENCOUNTER — Ambulatory Visit: Payer: Self-pay | Admitting: Dietician

## 2015-07-04 ENCOUNTER — Ambulatory Visit: Payer: Self-pay | Admitting: Dietician

## 2015-07-04 ENCOUNTER — Encounter: Payer: Self-pay | Admitting: Internal Medicine

## 2015-07-04 ENCOUNTER — Ambulatory Visit (INDEPENDENT_AMBULATORY_CARE_PROVIDER_SITE_OTHER): Payer: Self-pay | Admitting: Internal Medicine

## 2015-07-04 VITALS — BP 130/85 | HR 71 | Temp 98.3°F | Ht 66.0 in | Wt 271.1 lb

## 2015-07-04 DIAGNOSIS — G5601 Carpal tunnel syndrome, right upper limb: Secondary | ICD-10-CM

## 2015-07-04 DIAGNOSIS — N898 Other specified noninflammatory disorders of vagina: Secondary | ICD-10-CM

## 2015-07-04 NOTE — Patient Instructions (Signed)
1. Stop douching after intercourse.  Nothing but a normal shower soap.

## 2015-07-04 NOTE — Assessment & Plan Note (Signed)
Patient continues to have pain 2/2 carpal tunnel.  Recent NCS-EMG showed mod-severe carpal tunnel.  She has tried PT and is waiting for orthopedic availability for surgical correction.  She is happy to stay with the [facility] system, but can also go to [facility].  A/P: Mod-Severe right carpal tunnel.  Stable. Patient needs surgical correction. Referral has already been placed, but we are awaiting [employer] availability. Patient may request to go [facility] in the future, which is also reasonable if she is able to pay out of pocket.

## 2015-07-04 NOTE — Progress Notes (Signed)
Patient ID: Maria Sims, female   DOB: 1960-06-16, 55 y.o.   MRN: [id number]   Subjective:   Patient ID: Maria Sims female   DOB: 1960/07/04 55 y.o.   MRN: [id number]  HPI: [name] is a 55 y.o. female with PMH as below, here for f/u carpal tunnel syndrome.  Please see Problem-Based charting for the status of the patient's chronic medical issues.     Past Medical History  Diagnosis Date  . Tenosynovitis of foot and ankle 09/28/2013  . Hypertension   . Breast cancer [facility])    Current Outpatient Prescriptions  Medication Sig Dispense Refill  . FLUoxetine (PROZAC) 40 MG capsule Take 1 capsule (40 mg total) by mouth daily. 30 capsule 11  . gabapentin (NEURONTIN) 300 MG capsule Take 1 capsule (300 mg total) by mouth at bedtime. 30 capsule 2  . lisinopril-hydrochlorothiazide (PRINZIDE,ZESTORETIC) 20-12.5 MG tablet Take 1 tablet by mouth daily. 30 tablet 11  . naproxen (NAPROSYN) 500 MG tablet Take 1 tablet (500 mg total) by mouth 2 (two) times daily with a meal. 60 tablet 2  . pravastatin (PRAVACHOL) 40 MG tablet Take 1 tablet (40 mg total) by mouth daily. 30 tablet 11  . zolpidem (AMBIEN) 5 MG tablet Take 1 tablet (5 mg total) by mouth at bedtime as needed for sleep. 30 tablet 5   No current facility-administered medications for this visit.   Family History  Problem Relation Age of Onset  . Hypertension Mother   . Diabetes Father    Social History   Social History  . Marital Status: Single    Spouse Name: N/A  . Number of Children: N/A  . Years of Education: N/A   Social History Main Topics  . Smoking status: Never Smoker   . Smokeless tobacco: Never Used  . Alcohol Use: 0.0 oz/week    0 Standard drinks or equivalent per week     Comment: Social  . Drug Use: No  . Sexual Activity: Not Asked   Other Topics Concern  . None   Social History Narrative   Review of Systems: Patient endorses vaginal odor and discharge. She denies bleeding, fever, pain, or  dysuria. Objective:  Physical Exam: Filed Vitals:   07/04/15 1057  BP: 130/85  Pulse: 71  Temp: 98.3 F (36.8 C)  TempSrc: Oral  Height: 5\' 6"  (1.676 m)  Weight: 271 lb 1.6 oz (122.97 kg)  SpO2: 98%   Physical Exam  Constitutional: She is oriented to person, place, and time. No distress.  HENT:  Head: Normocephalic and atraumatic.  Eyes: EOM are normal. No scleral icterus.  Neck: No tracheal deviation present.  Cardiovascular: Normal rate, regular rhythm and normal heart sounds.   Pulmonary/Chest: Effort normal and breath sounds normal. No stridor. No respiratory distress. She has no wheezes.  Abdominal: Soft. She exhibits no distension. There is no tenderness.  Musculoskeletal:  Right wrist in splint.  Neurological: She is alert and oriented to person, place, and time.  Skin: Skin is warm and dry. She is not diaphoretic.     Assessment & Plan:   Patient and case were discussed with Dr. Lynnae January.  Please refer to Problem Based charting for further documentation.

## 2015-07-04 NOTE — Assessment & Plan Note (Signed)
Patient reports onset of watery, yellow, odorous vaginal discharge after starting Gabapentin.  She denies fevers, chills, pain, bleeding, or dysuria.  Her last pap was in April, which was normal.  She is sexually active with 2 partners and does not use protection.  She also douches the day following intercourse.  A/P: Vaginal odor and discharge likely 2/2 disrupted flora due to frequent vaginal douching.  Patient counseled that douching is not recommended and that if she needs to feel clean after intercourse, to shower.  She was also counseled about the need for protection with her sexual partners.  She deferred STI testing at this time.  Will have patient stop douching and evaluate for improvement in symptoms in the future. - Stop douching

## 2015-07-04 NOTE — Progress Notes (Signed)
Internal Medicine Clinic Attending  Case discussed with Dr. Taylor at the time of the visit.  We reviewed the resident's history and exam and pertinent patient test results.  I agree with the assessment, diagnosis, and plan of care documented in the resident's note. 

## 2015-07-16 ENCOUNTER — Ambulatory Visit: Payer: Self-pay | Admitting: Podiatry

## 2015-07-25 ENCOUNTER — Telehealth: Payer: Self-pay | Admitting: *Deleted

## 2015-07-25 MED ORDER — HYDROCODONE-IBUPROFEN 7.5-200 MG PO TABS: 1.0000 | ORAL_TABLET | Freq: Three times a day (TID) | ORAL | Status: DC | PRN

## 2015-07-25 NOTE — Telephone Encounter (Signed)
07/25/2015 Pt called today and ask can she get a RX. She doesn't have the money to come to her scheduled appointment next week or pay on her outstanding balance. She is having a lot of Right foot pain.

## 2015-07-30 ENCOUNTER — Ambulatory Visit: Payer: Self-pay | Admitting: Podiatry

## 2015-08-06 ENCOUNTER — Telehealth: Payer: Self-pay | Admitting: Internal Medicine

## 2015-08-06 NOTE — Telephone Encounter (Signed)
APPT. REMINDER CALL

## 2015-08-07 ENCOUNTER — Ambulatory Visit: Payer: Self-pay

## 2015-08-13 ENCOUNTER — Ambulatory Visit: Payer: Self-pay | Admitting: Podiatry

## 2015-09-03 ENCOUNTER — Ambulatory Visit: Payer: Self-pay

## 2015-09-05 ENCOUNTER — Telehealth: Payer: Self-pay | Admitting: Internal Medicine

## 2015-09-05 NOTE — Telephone Encounter (Signed)
APT. REMINDER CALL, LMTCB °

## 2015-09-06 ENCOUNTER — Encounter: Payer: Self-pay | Admitting: Internal Medicine

## 2015-09-06 ENCOUNTER — Ambulatory Visit: Payer: Self-pay

## 2015-09-06 ENCOUNTER — Other Ambulatory Visit: Payer: Self-pay | Admitting: Internal Medicine

## 2015-09-06 DIAGNOSIS — Z0000 Encounter for general adult medical examination without abnormal findings: Secondary | ICD-10-CM

## 2015-09-13 ENCOUNTER — Ambulatory Visit (INDEPENDENT_AMBULATORY_CARE_PROVIDER_SITE_OTHER): Payer: Self-pay | Admitting: Internal Medicine

## 2015-09-13 VITALS — BP 130/78 | HR 77 | Temp 98.7°F | Wt 266.1 lb

## 2015-09-13 DIAGNOSIS — M62831 Muscle spasm of calf: Secondary | ICD-10-CM

## 2015-09-13 DIAGNOSIS — R7303 Prediabetes: Secondary | ICD-10-CM

## 2015-09-13 DIAGNOSIS — M2141 Flat foot [pes planus] (acquired), right foot: Secondary | ICD-10-CM

## 2015-09-13 DIAGNOSIS — I10 Essential (primary) hypertension: Secondary | ICD-10-CM

## 2015-09-13 DIAGNOSIS — M2142 Flat foot [pes planus] (acquired), left foot: Secondary | ICD-10-CM

## 2015-09-13 DIAGNOSIS — M722 Plantar fascial fibromatosis: Secondary | ICD-10-CM

## 2015-09-13 DIAGNOSIS — G2581 Restless legs syndrome: Secondary | ICD-10-CM

## 2015-09-13 DIAGNOSIS — G5601 Carpal tunnel syndrome, right upper limb: Secondary | ICD-10-CM

## 2015-09-13 HISTORY — DX: Plantar fascial fibromatosis: M72.2

## 2015-09-13 MED ORDER — GABAPENTIN 300 MG PO CAPS: 300.0000 mg | ORAL_CAPSULE | Freq: Every day | ORAL | 2 refills | Status: DC

## 2015-09-13 MED ORDER — IBUPROFEN 200 MG PO TABS: 800.0000 mg | ORAL_TABLET | Freq: Three times a day (TID) | ORAL | 1 refills | Status: AC | PRN

## 2015-09-13 MED ORDER — CYCLOBENZAPRINE HCL 5 MG PO TABS: 5.0000 mg | ORAL_TABLET | Freq: Every evening | ORAL | 0 refills | Status: DC | PRN

## 2015-09-13 NOTE — Patient Instructions (Addendum)
It was a pleasure meeting you today!   1. Today we talked about your right foot pain. Your symptoms are consistent with Plantar Fasciitis. For this I recommended the below stretches and exercises. I have also referred you to a podiatrist to help you manage this condition and your flat feet.  2. I have prescribed Ibuprofen 800 mg for a short course to help you with your pain. Please do not exceed 3200 mg per day.  3. I have also prescribed a muscle relaxer called Cyclobenzaprine for your foot spasms. Please take this only as needed.  4. Please follow up with Maria Sims in the clinic in 3 months or sooner if needed.    Plantar Fasciitis With Rehab The plantar fascia is a fibrous, ligament-like, soft-tissue structure that spans the bottom of the foot. Plantar fasciitis, also called heel spur syndrome, is a condition that causes pain in the foot due to inflammation of the tissue. SYMPTOMS   Pain and tenderness on the underneath side of the foot.  Pain that worsens with standing or walking. CAUSES  Plantar fasciitis is caused by irritation and injury to the plantar fascia on the underneath side of the foot. Common mechanisms of injury include:  Direct trauma to bottom of the foot.  Damage to a small nerve that runs under the foot where the main fascia attaches to the heel bone.  Stress placed on the plantar fascia due to bone spurs. RISK INCREASES WITH:   Activities that place stress on the plantar fascia (running, jumping, pivoting, or cutting).  Poor strength and flexibility.  Improperly fitted shoes.  Tight calf muscles.  Flat feet.  Failure to warm-up properly before activity.  Obesity. PREVENTION  Warm up and stretch properly before activity.  Allow for adequate recovery between workouts.  Maintain physical fitness:  Strength, flexibility, and endurance.  Cardiovascular fitness.  Maintain a health body weight.  Avoid stress on the plantar fascia.  Wear properly fitted  shoes, including arch supports for individuals who have flat feet. PROGNOSIS  If treated properly, then the symptoms of plantar fasciitis usually resolve without surgery. However, occasionally surgery is necessary. RELATED COMPLICATIONS   Recurrent symptoms that may result in a chronic condition.  Problems of the lower back that are caused by compensating for the injury, such as limping.  Pain or weakness of the foot during push-off following surgery.  Chronic inflammation, scarring, and partial or complete fascia tear, occurring more often from repeated injections. TREATMENT  Treatment initially involves the use of ice and medication to help reduce pain and inflammation. The use of strengthening and stretching exercises may help reduce pain with activity, especially stretches of the Achilles tendon. These exercises may be performed at home or with a therapist. Your caregiver may recommend that you use heel cups of arch supports to help reduce stress on the plantar fascia. Occasionally, corticosteroid injections are given to reduce inflammation. If symptoms persist for greater than 6 months despite non-surgical (conservative), then surgery may be recommended.  MEDICATION   If pain medication is necessary, then nonsteroidal anti-inflammatory medications, such as aspirin and ibuprofen, or other minor pain relievers, such as acetaminophen, are often recommended.  Do not take pain medication within 7 days before surgery.  Prescription pain relievers may be given if deemed necessary by your caregiver. Use only as directed and only as much as you need.  Corticosteroid injections may be given by your caregiver. These injections should be reserved for the most serious cases, because they may  only be given a certain number of times. HEAT AND COLD  Cold treatment (icing) relieves pain and reduces inflammation. Cold treatment should be applied for 10 to 15 minutes every 2 to 3 hours for inflammation  and pain and immediately after any activity that aggravates your symptoms. Use ice packs or massage the area with a piece of ice (ice massage).  Heat treatment may be used prior to performing the stretching and strengthening activities prescribed by your caregiver, physical therapist, or athletic trainer. Use a heat pack or soak the injury in warm water. SEEK IMMEDIATE MEDICAL CARE IF:  Treatment seems to offer no benefit, or the condition worsens.  Any medications produce adverse side effects. EXERCISES RANGE OF MOTION (ROM) AND STRETCHING EXERCISES - Plantar Fasciitis (Heel Spur Syndrome) These exercises may help you when beginning to rehabilitate your injury. Your symptoms may resolve with or without further involvement from your physician, physical therapist or athletic trainer. While completing these exercises, remember:   Restoring tissue flexibility helps normal motion to return to the joints. This allows healthier, less painful movement and activity.  An effective stretch should be held for at least 30 seconds.  A stretch should never be painful. You should only feel a gentle lengthening or release in the stretched tissue. RANGE OF MOTION - Toe Extension, Flexion  Sit with your right / left leg crossed over your opposite knee.  Grasp your toes and gently pull them back toward the top of your foot. You should feel a stretch on the bottom of your toes and/or foot.  Hold this stretch for __________ seconds.  Now, gently pull your toes toward the bottom of your foot. You should feel a stretch on the top of your toes and or foot.  Hold this stretch for __________ seconds. Repeat __________ times. Complete this stretch __________ times per day.  RANGE OF MOTION - Ankle Dorsiflexion, Active Assisted  Remove shoes and sit on a chair that is preferably not on a carpeted surface.  Place right / left foot under knee. Extend your opposite leg for support.  Keeping your heel down, slide  your right / left foot back toward the chair until you feel a stretch at your ankle or calf. If you do not feel a stretch, slide your bottom forward to the edge of the chair, while still keeping your heel down.  Hold this stretch for __________ seconds. Repeat __________ times. Complete this stretch __________ times per day.  STRETCH - Gastroc, Standing  Place hands on wall.  Extend right / left leg, keeping the front knee somewhat bent.  Slightly point your toes inward on your back foot.  Keeping your right / left heel on the floor and your knee straight, shift your weight toward the wall, not allowing your back to arch.  You should feel a gentle stretch in the right / left calf. Hold this position for __________ seconds. Repeat __________ times. Complete this stretch __________ times per day. STRETCH - Soleus, Standing  Place hands on wall.  Extend right / left leg, keeping the other knee somewhat bent.  Slightly point your toes inward on your back foot.  Keep your right / left heel on the floor, bend your back knee, and slightly shift your weight over the back leg so that you feel a gentle stretch deep in your back calf.  Hold this position for __________ seconds. Repeat __________ times. Complete this stretch __________ times per day. STRETCH - Gastrocsoleus, Standing  Note: This  exercise can place a lot of stress on your foot and ankle. Please complete this exercise only if specifically instructed by your caregiver.   Place the ball of your right / left foot on a step, keeping your other foot firmly on the same step.  Hold on to the wall or a rail for balance.  Slowly lift your other foot, allowing your body weight to press your heel down over the edge of the step.  You should feel a stretch in your right / left calf.  Hold this position for __________ seconds.  Repeat this exercise with a slight bend in your right / left knee. Repeat __________ times. Complete this  stretch __________ times per day.  STRENGTHENING EXERCISES - Plantar Fasciitis (Heel Spur Syndrome)  These exercises may help you when beginning to rehabilitate your injury. They may resolve your symptoms with or without further involvement from your physician, physical therapist or athletic trainer. While completing these exercises, remember:   Muscles can gain both the endurance and the strength needed for everyday activities through controlled exercises.  Complete these exercises as instructed by your physician, physical therapist or athletic trainer. Progress the resistance and repetitions only as guided. STRENGTH - Towel Curls  Sit in a chair positioned on a non-carpeted surface.  Place your foot on a towel, keeping your heel on the floor.  Pull the towel toward your heel by only curling your toes. Keep your heel on the floor.  If instructed by your physician, physical therapist or athletic trainer, add ____________________ at the end of the towel. Repeat __________ times. Complete this exercise __________ times per day. STRENGTH - Ankle Inversion  Secure one end of a rubber exercise band/tubing to a fixed object (table, pole). Loop the other end around your foot just before your toes.  Place your fists between your knees. This will focus your strengthening at your ankle.  Slowly, pull your big toe up and in, making sure the band/tubing is positioned to resist the entire motion.  Hold this position for __________ seconds.  Have your muscles resist the band/tubing as it slowly pulls your foot back to the starting position. Repeat __________ times. Complete this exercises __________ times per day.    This information is not intended to replace advice given to you by your health care provider. Make sure you discuss any questions you have with your health care provider.   Document Released: 12/30/2004 Document Revised: 05/16/2014 Document Reviewed: 04/13/2008 Elsevier Interactive  Patient Education [employer].

## 2015-09-17 DIAGNOSIS — M2142 Flat foot [pes planus] (acquired), left foot: Secondary | ICD-10-CM | POA: Insufficient documentation

## 2015-09-17 DIAGNOSIS — M2141 Flat foot [pes planus] (acquired), right foot: Secondary | ICD-10-CM | POA: Insufficient documentation

## 2015-09-17 NOTE — Assessment & Plan Note (Signed)
Patient reports this is a lifelong condition. Has wanted orthopedic inserts before however was unable to afford them. -Referred to podiatry for assistance with this and with plantar fasciitis.

## 2015-09-17 NOTE — Assessment & Plan Note (Signed)
Patients history and physical exam consistent with plantar fasciitis. Information given on exercises to help with this condition. She has also been referred to Podiatry to help in management and also for evaluation of her pes planus.  -Ibuprofen 800 mg sent to pharmacy -Plantar fasciitis physical therapy instructions given to patient -Referred to podiatry

## 2015-09-17 NOTE — Assessment & Plan Note (Signed)
Under control during clinic visit with BP at 130/78. She reports compliance with her home Lisinopril-HCTZ 20-12.5 -Continue current management.

## 2015-09-17 NOTE — Assessment & Plan Note (Signed)
Pt reports occasional muscle spasm of right calf. This could be secondary to patients altered gait in order to avoid walking on her heel. She finds relief with cyclobenzaprine at home.  -Refill for cyclobenzaprine given.

## 2015-09-17 NOTE — Progress Notes (Signed)
   CC: right foot and heel pain for 2 months  HPI:  Maria Sims is a 55 y.o. female who presents to the clinic today for evaluation of foot and heel pain that's been present for 2 months. She reports the pain began suddenly when she was outside walking 2 months ago and has been worsening ever since. She states ibuprofen 800 mg and gabapentin helps her pain. She notes it is worse in the morning and improves throughout the day the more she walks on it. Denies any trauma, new exercise regimen, numbness or tingling. She reports she was told by her prior podiatrist that she has arthritis of BL feet.   Past Medical History:  Diagnosis Date  . Breast cancer ([place])   . Hypertension   . Tenosynovitis of foot and ankle 09/28/2013    Review of Systems:  Review of Systems  Constitutional: Negative for chills and fever.  Respiratory: Negative for cough and shortness of breath.   Cardiovascular: Positive for leg swelling. Negative for chest pain and orthopnea.  Gastrointestinal: Negative for abdominal pain, nausea and vomiting.  Musculoskeletal: Positive for joint pain. Negative for falls and myalgias.  Neurological: Negative for dizziness, tingling, sensory change and weakness.     Physical Exam: Physical Exam  Constitutional: She is well-developed, well-nourished, and in no distress.  HENT:  Head: Normocephalic and atraumatic.  Cardiovascular: Normal rate, regular rhythm and intact distal pulses.   No murmur heard. Pulmonary/Chest: Effort normal and breath sounds normal. No respiratory distress.  Abdominal: Soft. Bowel sounds are normal. She exhibits no distension. There is no tenderness.  Musculoskeletal:  BL Pes planus - patient reports this is a chronic condition BL non-pitting edema present of BL LE, R>L.  Tenderness to palpation of plantar tendons of right foot.   Skin: She is not diaphoretic.    Vitals:   09/13/15 1348  BP: 130/78  Pulse: 77  Temp: 98.7 F (37.1 C)    TempSrc: Oral  Weight: 266 lb 1.6 oz (120.7 kg)     Assessment & Plan:   See Encounters Tab for problem based charting.  Patient seen with Dr. Evette Doffing

## 2015-09-19 NOTE — Progress Notes (Signed)
Internal Medicine Clinic Attending  I saw and evaluated the patient.  I personally confirmed the key portions of the history and exam documented by Dr. Molt and I reviewed pertinent patient test results.  The assessment, diagnosis, and plan were formulated together and I agree with the documentation in the resident's note. 

## 2015-10-09 ENCOUNTER — Other Ambulatory Visit: Payer: Self-pay | Admitting: Internal Medicine

## 2015-10-09 DIAGNOSIS — Z1231 Encounter for screening mammogram for malignant neoplasm of breast: Secondary | ICD-10-CM

## 2015-10-22 ENCOUNTER — Ambulatory Visit: Payer: Self-pay | Attending: Family Medicine | Admitting: Podiatry

## 2015-10-22 DIAGNOSIS — M7751 Other enthesopathy of right foot: Secondary | ICD-10-CM

## 2015-10-22 DIAGNOSIS — M25571 Pain in right ankle and joints of right foot: Secondary | ICD-10-CM

## 2015-10-22 DIAGNOSIS — M79671 Pain in right foot: Secondary | ICD-10-CM

## 2015-10-22 DIAGNOSIS — M659 Synovitis and tenosynovitis, unspecified: Secondary | ICD-10-CM

## 2015-10-22 DIAGNOSIS — M722 Plantar fascial fibromatosis: Secondary | ICD-10-CM

## 2015-11-05 MED ORDER — BETAMETHASONE SOD PHOS & ACET 6 (3-3) MG/ML IJ SUSP: 3.0000 mg | Freq: Once | INTRAMUSCULAR | Status: DC

## 2015-11-05 NOTE — Progress Notes (Signed)
Subjective:  Patient presents today for heel pain to the right lower extremity for the past 2 months. Patient's parents a sharp pain and it hurts when she stands on the bottom of her foot. Patient states she is a borderline diabetic. Patient has a history of left foot surgery by Dr. Berkley Harvey.     Objective/Physical Exam General: The patient is alert and oriented x3 in no acute distress.  Dermatology: Skin is warm, dry and supple bilateral lower extremities. Negative for open lesions or macerations.  Vascular: Palpable pedal pulses bilaterally. No edema or erythema noted. Capillary refill within normal limits.  Neurological: Epicritic and protective threshold grossly intact bilaterally.   Musculoskeletal Exam: Pain on palpation range of motion to the right ankle joint both medial and lateral nature aspect. Pain on palpation to the plantar medial tubercle of the right calcaneus at the insertion of the plantar fascia. Range of motion within normal limits to all pedal and ankle joints bilateral. Muscle strength 5/5 in all groups bilateral.   Assessment: #1 plantar fasciitis right #2 ankle joint synovitis with capsulitis right   Plan of Care:  #1 Patient was evaluated. #2 injection of 0.5 mL Celestone Soluspan injected in the patient's right plantar fascia the insertion of the medial calcaneal tubercle without incident. #3 injection of 0.5 mL Celestone Soluspan injected in the patient's right ankle joint. #4 recommend conservative modalities including stretching, icing, rest, and compression to alleviate symptoms. Recommend wearing good shoe gear. #5 patient is to return to clinic when necessary   Dr. Edrick Kins, [place]

## 2015-11-06 MED FILL — HYDROCODON-APAP 5-325: 5-325 | 5 days supply | Qty: 30 | Fill #0

## 2015-11-21 ENCOUNTER — Ambulatory Visit: Payer: Self-pay

## 2015-11-22 ENCOUNTER — Encounter: Payer: Self-pay | Admitting: Internal Medicine

## 2015-11-22 ENCOUNTER — Ambulatory Visit: Payer: Self-pay | Admitting: Pharmacist

## 2015-11-22 ENCOUNTER — Ambulatory Visit (HOSPITAL_COMMUNITY)
Admission: RE | Admit: 2015-11-22 | Discharge: 2015-11-22 | Disposition: A | Discharge status: Home / Self Care | Payer: Self-pay | Source: Ambulatory Visit | Attending: Internal Medicine | Admitting: Internal Medicine

## 2015-11-22 ENCOUNTER — Ambulatory Visit (INDEPENDENT_AMBULATORY_CARE_PROVIDER_SITE_OTHER): Payer: Self-pay | Admitting: Internal Medicine

## 2015-11-22 VITALS — BP 141/86 | HR 67 | Temp 98.1°F | Ht 66.0 in | Wt 264.8 lb

## 2015-11-22 DIAGNOSIS — N3941 Urge incontinence: Secondary | ICD-10-CM

## 2015-11-22 DIAGNOSIS — Z79899 Other long term (current) drug therapy: Secondary | ICD-10-CM

## 2015-11-22 DIAGNOSIS — R0789 Other chest pain: Secondary | ICD-10-CM | POA: Insufficient documentation

## 2015-11-22 DIAGNOSIS — E785 Hyperlipidemia, unspecified: Secondary | ICD-10-CM

## 2015-11-22 DIAGNOSIS — M5416 Radiculopathy, lumbar region: Secondary | ICD-10-CM | POA: Insufficient documentation

## 2015-11-22 DIAGNOSIS — I208 Other forms of angina pectoris: Secondary | ICD-10-CM

## 2015-11-22 DIAGNOSIS — G8929 Other chronic pain: Secondary | ICD-10-CM

## 2015-11-22 DIAGNOSIS — I10 Essential (primary) hypertension: Secondary | ICD-10-CM | POA: Insufficient documentation

## 2015-11-22 DIAGNOSIS — R079 Chest pain, unspecified: Secondary | ICD-10-CM

## 2015-11-22 DIAGNOSIS — I440 Atrioventricular block, first degree: Secondary | ICD-10-CM

## 2015-11-22 DIAGNOSIS — M1711 Unilateral primary osteoarthritis, right knee: Secondary | ICD-10-CM

## 2015-11-22 DIAGNOSIS — F331 Major depressive disorder, recurrent, moderate: Secondary | ICD-10-CM

## 2015-11-22 MED ORDER — FLUOXETINE HCL 40 MG PO CAPS: 40.0000 mg | ORAL_CAPSULE | Freq: Every day | ORAL | 11 refills | Status: DC

## 2015-11-22 MED ORDER — GABAPENTIN 300 MG PO CAPS: 300.0000 mg | ORAL_CAPSULE | Freq: Every day | ORAL | 2 refills | Status: DC

## 2015-11-22 MED ORDER — PRAVASTATIN SODIUM 40 MG PO TABS: 40.0000 mg | ORAL_TABLET | Freq: Every day | ORAL | 11 refills | Status: DC

## 2015-11-22 MED ORDER — ASPIRIN EC 81 MG PO TBEC: 81.0000 mg | DELAYED_RELEASE_TABLET | Freq: Every day | ORAL | 2 refills | Status: AC

## 2015-11-22 MED ORDER — NITROGLYCERIN 0.4 MG SL SUBL: 0.4000 mg | SUBLINGUAL_TABLET | SUBLINGUAL | 3 refills | Status: AC | PRN

## 2015-11-22 MED ORDER — LISINOPRIL-HYDROCHLOROTHIAZIDE 20-12.5 MG PO TABS: 1.0000 | ORAL_TABLET | Freq: Every day | ORAL | 11 refills | Status: DC

## 2015-11-22 MED FILL — GABAPENTIN 300 MG CAPSULE: 300 | 30 days supply | Qty: 30 | Fill #0

## 2015-11-22 MED FILL — ASPIR-LOW EC 81 MG TABLET: 81 | 30 days supply | Qty: 30 | Fill #0

## 2015-11-22 MED FILL — FLUoxetine HCL 40 MG CAPS: 40 | 30 days supply | Qty: 30 | Fill #0

## 2015-11-22 MED FILL — PRAVASTATIN NA 40 MG TAB: 40 | 30 days supply | Qty: 30 | Fill #0

## 2015-11-22 MED FILL — NITROGLYCERIN 0.4 MG TAB SL: 0.4 | 30 days supply | Qty: 100 | Fill #0

## 2015-11-22 MED FILL — LISINOPRIL-HCTZ 20-12.5 MG: 20-12.5 | 30 days supply | Qty: 30 | Fill #0

## 2015-11-22 NOTE — Progress Notes (Signed)
   CC: chest pain   HPI: [name] is a 55 y.o. with past medical history as outlined below who presents to clinic for follow up of lower back pain. For the past 6 months she has had lower back spasm which radiates to her right leg. The pain is worse with climbing up stairs and sitting for extended periods. 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 She has been evaluated by orthopedic surgery and knows that she needs a knee replacement. She was taking gabapentin for pain but has not had access to medications recently due to cost issues.  She also notes that she has been having episodes of squeezing chest tightness. She has had 3 of these episodes in the last 3 months. They usually occur when she is exerting herself and are associated with diaphoresis. The tightness is not associated with eating or rest. These episodes last 5 minutes and the tightness improves somewhat when she rests. She has never had an episode of chest tightness at rest. She does not smoke cigarettes and has no immediate family history of myocardial infarction. She does have hypertension and hyperlipidemia. She is not currently having chest pain.    Please see problem list for status of the pt's chronic medical problems.  Past Medical History:  Diagnosis Date  . Breast cancer ([place])   . Hypertension   . Tenosynovitis of foot and ankle 09/28/2013    Review of Systems:  Review of Systems  Respiratory: Negative for shortness of breath.   Cardiovascular: Positive for chest pain. Negative for palpitations.  Genitourinary: Negative for dysuria and urgency.  Musculoskeletal: Positive for back pain.    Physical Exam:  Vitals:   11/22/15 1021  BP: (!) 141/86  Pulse: 67  Temp: 98.1 F (36.7 C)  TempSrc: Oral  SpO2: 99%  Weight: 264 lb 12.8 oz (120.1 kg)  Height: 5\' 6"  (1.676 m)   Physical Exam  Constitutional: No distress.    Cardiovascular: Normal rate and regular rhythm.   No murmur heard. Pulmonary/Chest: Effort normal. She has no wheezes. She has no rales.  Abdominal: Soft. She exhibits no distension. There is no tenderness.    Assessment & Plan:   See Encounters Tab for problem based charting.  Social constraints-  She is not able to afford her medications. All of her prescriptions were sent to [facility] and will be refilled free of charge for her there. Dr. Maudie Mercury has initiated her paperwork for the [facility] program.   Patient seen with Dr. Evette Doffing

## 2015-11-22 NOTE — Patient Instructions (Addendum)
It was a pleasure to meet you today Maria Sims!  Start taking aspirin 81 mg daily Only use the nitroglycerin tablets when needed for chest pain Continue taking your pravastatin daily   Call us if your chest pain worsens or if you develop new symptoms  Call us if you do not hear from the cardiologist  Schedule a follow up appointment with your primary care doctor in 1 month    Angina Pectoris Angina pectoris, often called angina, is extreme discomfort in the chest, neck, or arm. This is caused by a lack of blood in the middle and thickest layer of the heart wall (myocardium). There are four types of angina:  Stable angina. Stable angina usually occurs in episodes of predictable frequency and duration. It is usually brought on by physical activity, stress, or excitement. Stable angina usually lasts a few minutes and can often be relieved by a medicine that you place under your tongue. This medicine is called sublingual nitroglycerin.  Unstable angina. Unstable angina can occur even when you are doing little or no physical activity. It can even occur while you are sleeping or when you are at rest. It can suddenly increase in severity or frequency. It may not be relieved by sublingual nitroglycerin, and it can last up to 30 minutes.  Microvascular angina. This type of angina is caused by a disorder of tiny blood vessels called arterioles. Microvascular angina is more common in women. The pain may be more severe and last longer than other types of angina pectoris.  Prinzmetal or variant angina. This type of angina pectoris is rare and usually occurs when you are doing little or no physical activity. It especially occurs in the early morning hours. CAUSES Atherosclerosis is the cause of angina. This is the buildup of fat and cholesterol (plaque) on the inside of the arteries. Over time, the plaque may narrow or block the artery, and this will lessen blood flow to the heart. Plaque can also become  weak and break off within a coronary artery to form a clot and cause a sudden blockage. RISK FACTORS Risk factors common to both men and women include:  High cholesterol levels.  High blood pressure (hypertension).  Tobacco use.  Diabetes.  Family history of angina.  Obesity.  Lack of exercise.  A diet high in saturated fats. Women are at greater risk for angina if they are:  Over age 61.  Postmenopausal. SYMPTOMS Many people do not experience any symptoms during the early stages of angina. As the condition progresses, symptoms common to both men and women may include:  Chest pain.  The pain can be described as a crushing or squeezing in the chest, or a tightness, pressure, fullness, or heaviness in the chest.  The pain can last more than a few minutes, or it can stop and recur.  Pain in the arms, neck, jaw, or back.  Unexplained heartburn or indigestion.  Shortness of breath.  Nausea.  Sudden cold sweats.  Sudden light-headedness. Many women have chest discomfort and some of the other symptoms. However, women often have different (atypical) symptoms, such as:   Fatigue.  Unexplained feelings of nervousness or anxiety.  Unexplained weakness.  Dizziness or fainting. Sometimes, women may have angina without any symptoms. DIAGNOSIS  Tests to diagnose angina may include:  ECG (electrocardiogram).  Exercise stress test. This looks for signs of blockage when the heart is being exercised.  Pharmacologic stress test. This test looks for signs of blockage when the heart  is being stressed with a medicine.  Blood tests.  Coronary angiogram. This is a procedure to look at the coronary arteries to see if there is any blockage. TREATMENT  The treatment of angina may include the following:  Healthy behavioral changes to reduce or control risk factors.  Medicine.  Coronary stenting.A stent helps to keep an artery open.  Coronary angioplasty. This procedure  widens a narrowed or blocked artery.  Coronary arterybypass surgery. This will allow your blood to pass the blockage (bypass) to reach your heart. HOME CARE INSTRUCTIONS   Take medicines only as directed by your health care provider.  Do not take the following medicines unless your health care provider approves:  Nonsteroidal anti-inflammatory drugs (NSAIDs), such as ibuprofen, naproxen, or celecoxib.  Vitamin supplements that contain vitamin A, vitamin E, or both.  Hormone replacement therapy that contains estrogen with or without progestin.  Manage other health conditions such as hypertension and diabetes as directed by your health care provider.  Follow a heart-healthy diet. A dietitian can help to educate you about healthy food options and changes.  Use healthy cooking methods such as roasting, grilling, broiling, baking, poaching, steaming, or stir-frying. Talk to a dietitian to learn more about healthy cooking methods.  Follow an exercise program approved by your health care provider.  Maintain a healthy weight. Lose weight as approved by your health care provider.  Plan rest periods when fatigued.  Learn to manage stress.  Do not use any tobacco products, including cigarettes, chewing tobacco, or electronic cigarettes. If you need help quitting, ask your health care provider.  If you drink alcohol, and your health care provider approves, limit your alcohol intake to no more than 1 drink per day. One drink equals 12 ounces of beer, 5 ounces of wine, or 1 ounces of hard liquor.  Stop illegal drug use.  Keep all follow-up visits as directed by your health care provider. This is important. SEEK IMMEDIATE MEDICAL CARE IF:   You have pain in your chest, neck, arm, jaw, stomach, or back that lasts more than a few minutes, is recurring, or is unrelieved by taking sublingualnitroglycerin.  You have profuse sweating without cause.  You have unexplained:  Heartburn or  indigestion.  Shortness of breath or difficulty breathing.  Nausea or vomiting.  Fatigue.  Feelings of nervousness or anxiety.  Weakness.  Diarrhea.  You have sudden light-headedness or dizziness.  You faint. These symptoms may represent a serious problem that is an emergency. Do not wait to see if the symptoms will go away. Get medical help right away. Call your local emergency services (911 in the [place] Do not drive yourself to the hospital.   This information is not intended to replace advice given to you by your health care provider. Make sure you discuss any questions you have with your health care provider.   Document Released: 12/30/2004 Document Revised: 01/20/2014 Document Reviewed: 05/03/2013 Elsevier Interactive Patient Education [employer].

## 2015-11-22 NOTE — Assessment & Plan Note (Addendum)
She also notes that she has been having episodes of squeezing chest tightness. She has had 3 of these episodes in the last 3 months. They usually occur when she is exerting herself and are associated with diaphoresis. The tightness is not associated with eating or rest. These episodes last 5 minutes and the tightness improves somewhat when she rests. She has never had an episode of chest tightness at rest. She does not smoke cigarettes and has no immediate family history of myocardial infarction. She does have hypertension and hyperlipidemia. She is not currently having chest pain.   EKG showed first-degree AV block but no ST segment changes. Given these episodes of stable angina and her risk factor she is moderate risk of coronary artery disease and should have a EKG stress test and further cardiology evaluation   Referral to cardiology for EKG stress test Started Aspirin 81 mg daily  Ordered sublingual nitro tablets PRN  Resumed Pravastatin 40 mg daily

## 2015-11-23 NOTE — Assessment & Plan Note (Signed)
She has not been able to refill prozac due to cost and has had increased symptoms of  anxiety lately. She feels that her symptoms were well controlled when she was on the prozac.   Refilled Prozac 40 mg daily  Follow up in 1 months with PCP

## 2015-11-23 NOTE — Assessment & Plan Note (Addendum)
Well controlled, BP 141/86 today. She has been taking her lisinopril- HCTZ 20-12.5 mg daily.   Refilled lisinopril- HCTZ 20-12.5 mg daily.

## 2015-11-23 NOTE — Progress Notes (Signed)
Internal Medicine Clinic Attending  I saw and evaluated the patient.  I personally confirmed the key portions of the history and exam documented by Dr. Hetty Ely and I reviewed pertinent patient test results.  The assessment, diagnosis, and plan were formulated together and I agree with the documentation in the resident's note.  Patient with about once monthly episode of chest pressure with activity, some atypical features. Only risk factors are hypertension and hyperlipidemia, borderline age. No prior history of ischemic vascular disease. I think she falls into intermediate probability that these symptoms are coming from CAD. She does want a knee replacement within the next year for osteoarthritis; so I think a stress test is indicated in this case for those two reasons. In the meantime we will medically manage with aspirin, statin, ACEi and as needed SL nitro. Holding on BB for now until stress test complete (may not need it).

## 2015-11-23 NOTE — Assessment & Plan Note (Signed)
For the past 6 months she has had lower back spasm which radiates to her right leg. The pain is worse with climbing up stairs and sitting for extended periods. She has chronic mild urge incontinence but denies sadle anesthesia. 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 She has been evaluated by orthopedic surgery and knows that she needs a knee replacement. She was taking gabapentin for pain but has not had access to medications recently due to cost issues.  On exam she an analgesic gait, lumbar muscle spasm, strength and sensation are intact. The pain that she is experiencing is most likely related to her chronic lumbar radiculopathy and knee pain.   Refilled gabapentin 300 mg daily qHS  Follow up in 1 month with PCP

## 2015-11-23 NOTE — Progress Notes (Signed)
Pharmacist physician co-visit  S: Maria Sims is a 55 y.o. female who was reviewed for help with medication management. Patient did not bring medication bottles.  Allergies  Allergen Reactions  . Pineapple    Medication Sig  aspirin EC 81 MG tablet Take 1 tablet (81 mg total) by mouth daily.  cyclobenzaprine (FLEXERIL) 5 MG tablet Take 1 tablet (5 mg total) by mouth at bedtime as needed for muscle spasms.  FLUoxetine (PROZAC) 40 MG capsule Take 1 capsule (40 mg total) by mouth daily.  gabapentin (NEURONTIN) 300 MG capsule Take 1 capsule (300 mg total) by mouth at bedtime.  lisinopril-hydrochlorothiazide (PRINZIDE,ZESTORETIC) 20-12.5 MG tablet Take 1 tablet by mouth daily.  nitroGLYCERIN (NITROSTAT) 0.4 MG SL tablet Place 1 tablet (0.4 mg total) under the tongue every 5 (five) minutes as needed for chest pain.  pravastatin (PRAVACHOL) 40 MG tablet Take 1 tablet (40 mg total) by mouth daily.  zolpidem (AMBIEN) 5 MG tablet Take 1 tablet (5 mg total) by mouth at bedtime as needed for sleep.   Past Medical History:  Diagnosis Date  . Breast cancer ([place])   . Hypertension   . Tenosynovitis of foot and ankle 09/28/2013   Social History   Social History  . Marital status: Single    Spouse name: N/A  . Number of children: N/A  . Years of education: N/A   Social History Main Topics  . Smoking status: Never Smoker  . Smokeless tobacco: Never Used  . Alcohol use 0.0 oz/week     Comment: Social  . Drug use: No  . Sexual activity: Not on file   Other Topics Concern  . Not on file   Social History Narrative  . No narrative on file   Family History  Problem Relation Age of Onset  . Hypertension Mother   . Diabetes Father    O:    Component Value Date/Time   CHOL 283 (H) 10/10/2014 1126   HDL 62 10/10/2014 1126   TRIG 161 (H) 10/10/2014 1126   NA 141 10/10/2014 1126   K 4.7 10/10/2014 1126   CL 102 10/10/2014 1126   CO2 21 10/10/2014 1126   GLUCOSE 103 (H) 10/10/2014  1126   HGBA1C 6.3 06/06/2015 1129   BUN 9 10/10/2014 1126   CREATININE 0.67 10/10/2014 1126   CALCIUM 10.0 10/10/2014 1126   GFRAA 116 10/10/2014 1126   WBC 5.4 06/06/2015 1116   HCT 35.5 06/06/2015 1116   PLT 306 06/06/2015 1116   TSH 1.340 10/10/2014 1126   Ht Readings from Last 2 Encounters:  11/22/15 5\' 6"  (1.676 m)  07/04/15 5\' 6"  (1.676 m)   Wt Readings from Last 2 Encounters:  11/22/15 264 lb 12.8 oz (120.1 kg)  09/13/15 266 lb 1.6 oz (120.7 kg)   There is no height or weight on file to calculate BMI. BP Readings from Last 3 Encounters:  11/22/15 (!) 141/86  09/13/15 130/78  07/04/15 130/85   A/P:  Patient states she is out of medications and unable to afford  Referred patient to [facility]  Will continue to follow along/assist in the care of this patient as necessary   An after visit summary was provided and patient advised to follow up if any changes in condition or questions regarding medications arise.

## 2015-11-24 NOTE — Progress Notes (Signed)
Cardiology Office Note    Date:  11/26/2015   ID:  Maria Sims, DOB [birth year], MRN [id number]  PCP:  Florinda Marker, MD  Cardiologist:  [facility] (Dr. Johnsie Cancel) Chief Complaint: Chest pain  History of Present Illness:   Maria Sims is a 55 y.o. female HTN, HLD, OA, chronic lumbar radiculopathy and R breast cancer who sent by PCP for evaluation of chest pain.   The patient was evaluated by PCP 11/22/15 for chest pressure with atypical features. EKG showed NSR at rate of 70 bpm and PR interval of 259ms. Recently increased in anxiety as she was ubable to refill Prozac.   She is here to for follow up. She has multiple complains today. Chest pain described as intermittent "chest tightness at burning sensation" x 3 months. She burning sensation gets worse with laying down. She does have orthopnea and uses 3 pillows chronically. Intermittent LE edema. She adds extra salt to diet. Hx of palpitation for the past one month. 2 weeks ago she had worse  episode of palpitation with dizziness while driving. Last episode 3 days ago. This occurs once-twice/week and last for about 5 minutes and resolved. She used lift elliptical whoever do able to do so due to chest pain. She denies PND, syncope, abdominal pain, weight gain. Denies hx of tobacco abuse or illicit drug use. Today she complains of left upper chest tightness. No other associated symptoms.     Past Medical History:  Diagnosis Date  . Breast cancer ([place])   . Hypertension   . Tenosynovitis of foot and ankle 09/28/2013    Past Surgical History:  Procedure Laterality Date  . ABDOMINAL HYSTERECTOMY    . BREAST LUMPECTOMY    . BREAST RECONSTRUCTION    . BUNIONECTOMY Left 01/26/2014   @ [facility]  . CHOLECYSTECTOMY    . COTTON OSTEOTOMY W/ BONE GRAFT Left 01/26/2014   @ [facility]  . KNEE SURGERY    . NECK SURGERY      Current Medications: Prior to Admission medications   Medication Sig Start Date End Date Taking? Authorizing  Provider  aspirin EC 81 MG tablet Take 1 tablet (81 mg total) by mouth daily. 11/22/15 11/21/16  Ledell Noss, MD  cyclobenzaprine (FLEXERIL) 5 MG tablet Take 1 tablet (5 mg total) by mouth at bedtime as needed for muscle spasms. 09/13/15   Bethany Molt, DO  FLUoxetine (PROZAC) 40 MG capsule Take 1 capsule (40 mg total) by mouth daily. 11/22/15   Ledell Noss, MD  gabapentin (NEURONTIN) 300 MG capsule Take 1 capsule (300 mg total) by mouth at bedtime. 11/22/15 11/21/16  Ledell Noss, MD  lisinopril-hydrochlorothiazide (PRINZIDE,ZESTORETIC) 20-12.5 MG tablet Take 1 tablet by mouth daily. 11/22/15   Ledell Noss, MD  nitroGLYCERIN (NITROSTAT) 0.4 MG SL tablet Place 1 tablet (0.4 mg total) under the tongue every 5 (five) minutes as needed for chest pain. 11/22/15 11/21/16  Ledell Noss, MD  pravastatin (PRAVACHOL) 40 MG tablet Take 1 tablet (40 mg total) by mouth daily. 11/22/15   Ledell Noss, MD  zolpidem (AMBIEN) 5 MG tablet Take 1 tablet (5 mg total) by mouth at bedtime as needed for sleep. 10/11/14   Florinda Marker, MD    Allergies:   Pineapple   Social History   Social History  . Marital status: Single    Spouse name: N/A  . Number of children: N/A  . Years of education: N/A   Social History Main Topics  . Smoking status: Never Smoker  .  Smokeless tobacco: Never Used  . Alcohol use 0.0 oz/week     Comment: Social  . Drug use: No  . Sexual activity: Not Asked   Other Topics Concern  . None   Social History Narrative  . None     Family History:  The patient's family history includes Diabetes in her father; Hypertension in her mother.   ROS:   Please see the history of present illness.    ROS All other systems reviewed and are negative.   PHYSICAL EXAM:   VS:  BP 124/78   Pulse 71   Ht 5\' 6"  (1.676 m)   Wt 257 lb 8 oz (116.8 kg)   SpO2 98%   BMI 41.56 kg/m    GEN: Well nourished, well developed, in no acute distress  HEENT: normal  Neck: no JVD, carotid bruits, or masses Cardiac: RRR; no  murmurs, rubs, or gallops,no edema. Chest chest pain is reproducible with palpation Respiratory:  clear to auscultation bilaterally, normal work of breathing GI: soft, nontender, nondistended, + BS MS: no deformity or atrophy  Skin: warm and dry, no rash Neuro:  Alert and Oriented x 3, Strength and sensation are intact Psych: euthymic mood, full affect  Wt Readings from Last 3 Encounters:  11/26/15 257 lb 8 oz (116.8 kg)  11/22/15 264 lb 12.8 oz (120.1 kg)  09/13/15 266 lb 1.6 oz (120.7 kg)      Studies/Labs Reviewed:   EKG:  EKG is ordered today.  The ekg ordered today demonstrates NSR at rate of 68 bpm.   Recent Labs: 06/06/2015: Platelets 306   Lipid Panel    Component Value Date/Time   CHOL 283 (H) 10/10/2014 1126   TRIG 161 (H) 10/10/2014 1126   HDL 62 10/10/2014 1126   CHOLHDL 4.6 (H) 10/10/2014 1126   LDLCALC 189 (H) 10/10/2014 1126    Additional studies/ records that were reviewed today include:   As above    ASSESSMENT & PLAN:    1. Chest pain - with atypical features. EKG reassuring today. Cp reproducible with palpation and gets worse with laying down however able to exercise due to chest pain. Will start protonix and get stress echo as she is having dyspnea and orthopnea as well. Discussed with DOD Dr. Johnsie Cancel. She states that she can walk on treadmill.   2. LE edema - Its intermittent.  Euvolemic today. Advised to cut back on salt.   3. HLD - Managed by PCP. Last LDL was 189 with triglyceride of 161 and HDL of 62 on 09/2014.  - Continue Pravastatin. She is due for labs.   4. HTN - Stable and well controlled on current regimen.  5. Palpations - Will continue to monitor for now. Cut back on caffeine. She will let us know if worsen. At that time will need 4 weeks event monitor and BB.    Medication Adjustments/Labs and Tests Ordered: Current medicines are reviewed at length with the patient today.  Concerns regarding medicines are outlined above.   Medication changes, Labs and Tests ordered today are listed in the Patient Instructions below. Patient Instructions  Your physician has recommended you make the following change in your medication:   START PROTONIX  [address] has requested that you have a stress echocardiogram. For further information please visit [email or web address]. Please follow instruction sheet as given.    Your physician recommends that you schedule a follow-up appointment in: 2 -3  WEEKS  WITH VIN      Jarrett Soho, [place]  11/26/2015 3:56 PM    [facility] [address], [place], [place]  [address] Phone: [phone]; Fax: [phone]

## 2015-11-26 ENCOUNTER — Ambulatory Visit (INDEPENDENT_AMBULATORY_CARE_PROVIDER_SITE_OTHER): Payer: Self-pay | Admitting: Physician Assistant

## 2015-11-26 ENCOUNTER — Encounter: Payer: Self-pay | Admitting: Physician Assistant

## 2015-11-26 VITALS — BP 124/78 | HR 71 | Ht 66.0 in | Wt 257.5 lb

## 2015-11-26 DIAGNOSIS — R0609 Other forms of dyspnea: Secondary | ICD-10-CM

## 2015-11-26 DIAGNOSIS — R079 Chest pain, unspecified: Secondary | ICD-10-CM

## 2015-11-26 DIAGNOSIS — E782 Mixed hyperlipidemia: Secondary | ICD-10-CM

## 2015-11-26 DIAGNOSIS — I10 Essential (primary) hypertension: Secondary | ICD-10-CM

## 2015-11-26 DIAGNOSIS — R002 Palpitations: Secondary | ICD-10-CM

## 2015-11-26 DIAGNOSIS — R600 Localized edema: Secondary | ICD-10-CM

## 2015-11-26 MED ORDER — PANTOPRAZOLE SODIUM 40 MG PO TBEC: 40.0000 mg | DELAYED_RELEASE_TABLET | Freq: Every day | ORAL | 11 refills | Status: DC

## 2015-11-26 NOTE — Patient Instructions (Addendum)
Your physician has recommended you make the following change in your medication:   START PROTONIX  [address] has requested that you have a stress echocardiogram. For further information please visit [email or web address]. Please follow instruction sheet as given.    Your physician recommends that you schedule a follow-up appointment in: 2 -3  WEEKS   WITH VIN

## 2015-11-27 ENCOUNTER — Telehealth: Payer: Self-pay | Admitting: Licensed Clinical Social Worker

## 2015-11-27 NOTE — Telephone Encounter (Signed)
CSW returned call to Ms. Puerta.  Pt requesting letter from PCP in order to continue her food stamp assistance.  Ms. Cunningham DSS caseworker, D. Sherren Mocha, fax # [phone].  Ms. Rings requesting letter from PCP indicating she is unable to work for specified time period in order to continue to receive her food assistance.  Pt states she knows she is unable to work permanently due to her medical conditio and has a pending Lawyer.  Pt notified this request will be forwarded to PCP.  Pt aware CSW will notify patient of PCP's decision.

## 2015-11-27 NOTE — Telephone Encounter (Signed)
Shana,  That would be fine. Would you please create a letter for me? I will be happy to sign it. We can create the letter for a 3 month time frame.   Thank you, Aidee Latimore Quay Burow

## 2015-11-28 ENCOUNTER — Encounter: Payer: Self-pay | Admitting: Internal Medicine

## 2015-11-28 NOTE — Telephone Encounter (Signed)
Letter in your mailbox and will be faxed to DSS upon signature and pt to be notified.

## 2015-11-28 NOTE — Telephone Encounter (Signed)
Pt notified and aware of the need to return to [facility] in the month of February 2018.  Copy mailed to patient.

## 2015-12-12 ENCOUNTER — Other Ambulatory Visit: Payer: Self-pay | Admitting: Physician Assistant

## 2015-12-12 ENCOUNTER — Ambulatory Visit (HOSPITAL_COMMUNITY)
Admission: RE | Admit: 2015-12-12 | Discharge: 2015-12-12 | Disposition: A | Discharge status: Home / Self Care | Payer: Self-pay | Source: Ambulatory Visit | Attending: Physician Assistant | Admitting: Physician Assistant

## 2015-12-12 DIAGNOSIS — R079 Chest pain, unspecified: Secondary | ICD-10-CM

## 2015-12-12 LAB — ECHOCARDIOGRAM STRESS TEST
CHL RATE OF PERCEIVED EXERTION: 19
CSEPED: 4 min
CSEPEDS: 40 s
CSEPEW: 6.5 METS
MPHR: 96 {beats}/min
Peak HR: 160 {beats}/min
Percent HR: 166 %
Rest HR: 82 {beats}/min

## 2015-12-12 NOTE — Progress Notes (Signed)
  Echocardiogram Echocardiogram Stress Test has been performed.  Tresa Res 12/12/2015, 12:31 PM

## 2015-12-12 NOTE — Progress Notes (Deleted)
Cardiology Office Note    Date:  12/12/2015   ID:  Maria Sims, DOB 1960/05/09, MRN [id number]  PCP:  Florinda Marker, MD  Cardiologist:  Dr. Johnsie Cancel  Chief Complaint: Chest pain f/u s/p stress echo  History of Present Illness:   Maria Sims is a 55 y.o. female  HTN, HLD, OA, chronic lumbar radiculopathy and R breast cancer who presented for follow up.   The patient was evaluated by PCP 11/22/15 for chest pressure with atypical features. EKG showed NSR at rate of 70 bpm and PR interval of 24ms. Recently increased in anxiety as she was ubable to refill Prozac.   Seen by me 11/26/15 for chest pain evaluation. Her pain was atypical. Started on Protonix. Advised to cut back on salt for edema and caffeine intake for palpitations.  Stress echo showed   Here today for follow up.   Past Medical History:  Diagnosis Date  . Breast cancer ([place])   . Hypertension   . Tenosynovitis of foot and ankle 09/28/2013    Past Surgical History:  Procedure Laterality Date  . ABDOMINAL HYSTERECTOMY    . BREAST LUMPECTOMY    . BREAST RECONSTRUCTION    . BUNIONECTOMY Left 01/26/2014   @ [facility]  . CHOLECYSTECTOMY    . COTTON OSTEOTOMY W/ BONE GRAFT Left 01/26/2014   @ [facility]  . KNEE SURGERY    . NECK SURGERY      Current Medications: Prior to Admission medications   Medication Sig Start Date End Date Taking? Authorizing Provider  aspirin EC 81 MG tablet Take 1 tablet (81 mg total) by mouth daily. 11/22/15 11/21/16  Ledell Noss, MD  cyclobenzaprine (FLEXERIL) 5 MG tablet Take 1 tablet (5 mg total) by mouth at bedtime as needed for muscle spasms. 09/13/15   Bethany Molt, DO  FLUoxetine (PROZAC) 40 MG capsule Take 1 capsule (40 mg total) by mouth daily. 11/22/15   Ledell Noss, MD  gabapentin (NEURONTIN) 300 MG capsule Take 1 capsule (300 mg total) by mouth at bedtime. 11/22/15 11/21/16  Ledell Noss, MD  lisinopril-hydrochlorothiazide (PRINZIDE,ZESTORETIC) 20-12.5 MG tablet Take 1  tablet by mouth daily. 11/22/15   Ledell Noss, MD  naproxen (NAPROSYN) 500 MG tablet Take 500 mg by mouth 2 (two) times daily with a meal.    Historical Provider, MD  nitroGLYCERIN (NITROSTAT) 0.4 MG SL tablet Place 1 tablet (0.4 mg total) under the tongue every 5 (five) minutes as needed for chest pain. 11/22/15 11/21/16  Ledell Noss, MD  pantoprazole (PROTONIX) 40 MG tablet Take 1 tablet (40 mg total) by mouth daily. 11/26/15   Dondrea Clendenin, PA  pravastatin (PRAVACHOL) 40 MG tablet Take 1 tablet (40 mg total) by mouth daily. 11/22/15   Ledell Noss, MD  zolpidem (AMBIEN) 5 MG tablet Take 1 tablet (5 mg total) by mouth at bedtime as needed for sleep. 10/11/14   Florinda Marker, MD    Allergies:   Pineapple   Social History   Social History  . Marital status: Single    Spouse name: N/A  . Number of children: N/A  . Years of education: N/A   Social History Main Topics  . Smoking status: Never Smoker  . Smokeless tobacco: Never Used  . Alcohol use 0.0 oz/week     Comment: Social  . Drug use: No  . Sexual activity: Not on file   Other Topics Concern  . Not on file   Social History Narrative  . No narrative on  file     Family History:  The patient's family history includes Diabetes in her father; Hypertension in her mother. ***  ROS:   Please see the history of present illness.    ROS All other systems reviewed and are negative.   PHYSICAL EXAM:   VS:  There were no vitals taken for this visit.   GEN: Well nourished, well developed, in no acute distress  HEENT: normal  Neck: no JVD, carotid bruits, or masses Cardiac: ***RRR; no murmurs, rubs, or gallops,no edema  Respiratory:  clear to auscultation bilaterally, normal work of breathing GI: soft, nontender, nondistended, + BS MS: no deformity or atrophy  Skin: warm and dry, no rash Neuro:  Alert and Oriented x 3, Strength and sensation are intact Psych: euthymic mood, full affect  Wt Readings from Last 3 Encounters:  11/26/15  257 lb 8 oz (116.8 kg)  11/22/15 264 lb 12.8 oz (120.1 kg)  09/13/15 266 lb 1.6 oz (120.7 kg)      Studies/Labs Reviewed:   EKG:  EKG is ordered today.  The ekg ordered today demonstrates ***  Recent Labs: 06/06/2015: Platelets 306   Lipid Panel    Component Value Date/Time   CHOL 283 (H) 10/10/2014 1126   TRIG 161 (H) 10/10/2014 1126   HDL 62 10/10/2014 1126   CHOLHDL 4.6 (H) 10/10/2014 1126   LDLCALC 189 (H) 10/10/2014 1126    Additional studies/ records that were reviewed today include:   Echocardiogram:  Cardiac Catheterization:     ASSESSMENT & PLAN:    1. Chest pain -   2. LE edema -  3. HLD - Managed by PCP. Last LDL was 189 with triglyceride of 161 and HDL of 62 on 09/2014.  - Continue Pravastatin. She is due for labs.   4. HTN - Stable and well controlled on current regimen.  5. Palpations - Will continue to monitor for now. Cut back on caffeine. She will let us know if worsen. At that time will need 4 weeks event monitor and BB.   ***    Medication Adjustments/Labs and Tests Ordered: Current medicines are reviewed at length with the patient today.  Concerns regarding medicines are outlined above.  Medication changes, Labs and Tests ordered today are listed in the Patient Instructions below. There are no Patient Instructions on file for this visit.   Jarrett Soho, [place]  12/12/2015 2:56 PM    [facility] [place], [place], [place]  [address] Phone: [phone]; Fax: [phone]

## 2015-12-13 NOTE — Progress Notes (Signed)
Cardiology Office Note    Date:  12/14/2015   ID:  Fleur Uren, DOB [birth year], MRN [id number]  PCP:  Florinda Marker, MD  Cardiologist:  Dr. Johnsie Cancel   Chief Complaint: Chest pain follow up  History of Present Illness:   Maria Sims is a 55 y.o. female  With PMH of HTN, HLD, OA, chronic lumbar radiculopathy and R breast cancer who presented for chest pain follow up.   Seen my me and Dr. Johnsie Cancel 11/26/15 for chest pain. Her chest pain was atypical. Started on trial of PPI and stress echo is normal. She was wearing monitor for palpitation. Advised to cut back. Also advised to cut back on salt for intermittent LE Edema.   Here today for follow up. She continues to have intermittent chest pain. She hasn't started on PPI yet. Two days ago she had episode of chest tightness while sitting with dyspnea. Took SL nitro x 1 with resolution of pain. No reoccurrence. Her palpitation has been improved since cut back on caffeine. Companies of R leg pain that starts from her back and radiates down to legs. Occurs with and without exertional. Denies orthopnea, PND or syncope. Has dry cough and nasal congestion for past few days.    Past Medical History:  Diagnosis Date  . Breast cancer ([place])   . Hypertension   . Tenosynovitis of foot and ankle 09/28/2013    Past Surgical History:  Procedure Laterality Date  . ABDOMINAL HYSTERECTOMY    . BREAST LUMPECTOMY    . BREAST RECONSTRUCTION    . BUNIONECTOMY Left 01/26/2014   @ [facility]  . CHOLECYSTECTOMY    . COTTON OSTEOTOMY W/ BONE GRAFT Left 01/26/2014   @ [place]  . KNEE SURGERY    . NECK SURGERY      Current Medications: Prior to Admission medications   Medication Sig Start Date End Date Taking? Authorizing Provider  aspirin EC 81 MG tablet Take 1 tablet (81 mg total) by mouth daily. 11/22/15 11/21/16  Ledell Noss, MD  cyclobenzaprine (FLEXERIL) 5 MG tablet Take 1 tablet (5 mg total) by mouth at bedtime as needed for muscle  spasms. 09/13/15   Bethany Molt, DO  FLUoxetine (PROZAC) 40 MG capsule Take 1 capsule (40 mg total) by mouth daily. 11/22/15   Ledell Noss, MD  gabapentin (NEURONTIN) 300 MG capsule Take 1 capsule (300 mg total) by mouth at bedtime. 11/22/15 11/21/16  Ledell Noss, MD  lisinopril-hydrochlorothiazide (PRINZIDE,ZESTORETIC) 20-12.5 MG tablet Take 1 tablet by mouth daily. 11/22/15   Ledell Noss, MD  naproxen (NAPROSYN) 500 MG tablet Take 500 mg by mouth 2 (two) times daily with a meal.    Historical Provider, MD  nitroGLYCERIN (NITROSTAT) 0.4 MG SL tablet Place 1 tablet (0.4 mg total) under the tongue every 5 (five) minutes as needed for chest pain. 11/22/15 11/21/16  Ledell Noss, MD  pantoprazole (PROTONIX) 40 MG tablet Take 1 tablet (40 mg total) by mouth daily. 11/26/15   Jourden Delmont, PA  pravastatin (PRAVACHOL) 40 MG tablet Take 1 tablet (40 mg total) by mouth daily. 11/22/15   Ledell Noss, MD  zolpidem (AMBIEN) 5 MG tablet Take 1 tablet (5 mg total) by mouth at bedtime as needed for sleep. 10/11/14   Florinda Marker, MD    Allergies:   Pineapple   Social History   Social History  . Marital status: Single    Spouse name: N/A  . Number of children: N/A  . Years of education: N/A  Social History Main Topics  . Smoking status: Never Smoker  . Smokeless tobacco: Never Used  . Alcohol use 0.0 oz/week     Comment: Social  . Drug use: No  . Sexual activity: Not Asked   Other Topics Concern  . None   Social History Narrative  . None     Family History:  The patient's family history includes Diabetes in her father; Hypertension in her mother.   ROS:   Please see the history of present illness.    ROS All other systems reviewed and are negative.   PHYSICAL EXAM:   VS:  BP 128/72   Pulse (!) 52   Ht 5\' 6"  (1.676 m)   Wt 255 lb 8 oz (115.9 kg)   BMI 41.24 kg/m    GEN: Well nourished, well developed, in no acute distress  HEENT: normal  Neck: no JVD, carotid bruits, or masses Cardiac:RRR;  no murmurs, rubs, or gallops,no edema  Respiratory:  clear to auscultation bilaterally, normal work of breathing GI: soft, nontender, nondistended, + BS MS: no deformity or atrophy  Skin: warm and dry, no rash Neuro:  Alert and Oriented x 3, Strength and sensation are intact Psych: euthymic mood, full affect  Wt Readings from Last 3 Encounters:  12/14/15 255 lb 8 oz (115.9 kg)  11/26/15 257 lb 8 oz (116.8 kg)  11/22/15 264 lb 12.8 oz (120.1 kg)      Studies/Labs Reviewed:   EKG:  EKG is not ordered today.    Recent Labs: 06/06/2015: Platelets 306   Lipid Panel    Component Value Date/Time   CHOL 283 (H) 10/10/2014 1126   TRIG 161 (H) 10/10/2014 1126   HDL 62 10/10/2014 1126   CHOLHDL 4.6 (H) 10/10/2014 1126   LDLCALC 189 (H) 10/10/2014 1126    Additional studies/ records that were reviewed today include:   Stress Echocardiogram: 12/12/15  Study Conclusions  - Stress ECG conclusions: The stress ECG was negative for ischemia. - Staged echo: There was no echocardiographic evidence for   stress-induced ischemia.  Impressions:  - Stress echo with no chest pain, no ST changes and no stress   induced wall motion abnormalities.  Stress results:   Maximal heart rate during stress was 160 bpm (96% of maximal predicted heart rate). The maximal predicted heart rate was 166 bpm.The target heart rate was achieved. The heart rate response to stress was normal. There was a normal resting blood pressure with an appropriate response to stress. The rate-pressure product for the peak heart rate and blood pressure was 17542 mm Hg/min.  The patient experienced no chest pain during stress.  ------------------------------------------------------------------- Stress ECG:   Rare atrial ectopy.  The stress ECG was negative for ischemia.  ------------------------------------------------------------------- Baseline:  - LV size was normal. - LV global systolic function was  normal. The estimated LV ejection   fraction was 60%. - Normal wall motion; no LV regional wall motion abnormalities.  Peak stress:  - LV size was reduced appropriately. - LV global systolic function was vigorous. The estimated LV   ejection fraction was 80%. - No evidence for new LV regional wall motion abnormalities.  ------------------------------------------------------------------- Stress echo results:     Left ventricular ejection fraction was normal at rest and with stress. There was no echocardiographic evidence for stress-induced ischemia.     ASSESSMENT & PLAN:    1. Chest pain - Stress echo is normal. One episode of chest pain that resolved with SL nitro x 1.  She will start trial of PPI. Continue ASA and stain. PRN nitro. IF her symptoms gets worse or requires more SL nitro she will let us know. She has baseline bradycardia. Not on any AV blocking agent.   2. Palpitations - Improved with cut back on caffeine. Advice complete cessation. If no improvement will get monitor in future.   3. HLD - managed by PCP  4. HTN - Stable and well controlled.  5. R leg pain - Consistent with chronic lumbar radiculopathy  6. Possible bronchitis - Non smoker. She will follow up with PCP. She is on lisinopril however seems not due to this as she also has nasal congestion.    Medication Adjustments/Labs and Tests Ordered: Current medicines are reviewed at length with the patient today.  Concerns regarding medicines are outlined above.  Medication changes, Labs and Tests ordered today are listed in the Patient Instructions below. Patient Instructions  Medication Instructions:  Your physician recommends that you continue on your current medications as directed. Please refer to the Current Medication list given to you today.   Labwork: None ordered  Testing/Procedures: None ordered  Follow-Up: Your physician recommends that you schedule a follow-up appointment in: 2  MONTHS WITH DR. Johnsie Cancel   Any Other Special Instructions Will Be Listed Below (If Applicable).     If you need a refill on your cardiac medications before your next appointment, please call your pharmacy.      Jarrett Soho, [place]  12/14/2015 10:32 AM    [facility] [place], [place], [place]  [address] Phone: [phone]; Fax: [phone]

## 2015-12-14 ENCOUNTER — Encounter: Payer: Self-pay | Admitting: Physician Assistant

## 2015-12-14 ENCOUNTER — Ambulatory Visit (INDEPENDENT_AMBULATORY_CARE_PROVIDER_SITE_OTHER): Payer: Self-pay | Admitting: Physician Assistant

## 2015-12-14 ENCOUNTER — Ambulatory Visit: Payer: Self-pay | Admitting: Physician Assistant

## 2015-12-14 VITALS — BP 128/72 | HR 52 | Ht 66.0 in | Wt 255.5 lb

## 2015-12-14 DIAGNOSIS — R002 Palpitations: Secondary | ICD-10-CM

## 2015-12-14 DIAGNOSIS — I10 Essential (primary) hypertension: Secondary | ICD-10-CM

## 2015-12-14 DIAGNOSIS — R079 Chest pain, unspecified: Secondary | ICD-10-CM

## 2015-12-14 DIAGNOSIS — R059 Cough, unspecified: Secondary | ICD-10-CM

## 2015-12-14 DIAGNOSIS — E782 Mixed hyperlipidemia: Secondary | ICD-10-CM

## 2015-12-14 DIAGNOSIS — R05 Cough: Secondary | ICD-10-CM

## 2015-12-14 MED ORDER — PANTOPRAZOLE SODIUM 40 MG PO TBEC: 40.0000 mg | DELAYED_RELEASE_TABLET | Freq: Every day | ORAL | 11 refills | Status: DC

## 2015-12-14 MED FILL — PANTOPRAZOLE SOD DR 40 MG T: 40 | 30 days supply | Qty: 30 | Fill #0

## 2015-12-14 NOTE — Patient Instructions (Addendum)
Medication Instructions:  Your physician recommends that you continue on your current medications as directed. Please refer to the Current Medication list given to you today.   Labwork: None ordered  Testing/Procedures: None ordered  Follow-Up Your physician recommends that you schedule a follow-up appointment in: 2 MONTHS WITH DR. NISHAN    Any Other Special Instructions Will Be Listed Below (If Applicable).     If you need a refill on your cardiac medications before your next appointment, please call your pharmacy.   

## 2015-12-17 ENCOUNTER — Ambulatory Visit: Payer: Self-pay | Attending: Internal Medicine | Admitting: Podiatry

## 2015-12-17 DIAGNOSIS — M722 Plantar fascial fibromatosis: Secondary | ICD-10-CM

## 2015-12-17 DIAGNOSIS — M79671 Pain in right foot: Secondary | ICD-10-CM

## 2015-12-18 ENCOUNTER — Telehealth: Payer: Self-pay | Admitting: Internal Medicine

## 2015-12-18 NOTE — Progress Notes (Signed)
    CC: Follow up for Atypical Chest Pain  HPI: [name] is a 55 y.o. female with PMHx of HTN, HLD, OA, Chronic Lumbar Radiculopathy and breast cancer who presents to the clinic for follow up for atypical chest pain.   One month ago, patient was seen in clinic for atypical chest pain. She was seen by Cardiology who performed a stress echo which was normal. Her palpitations improved after cutting back on caffeine. She was also instructed to start a PPI for GERD. Patient was instructed to continue ASA, statin and prn nitroglycerin. Patient states that she has not experienced the chest pain episode in the past 2 weeks. Previously when they occurred, it would come on after exerting herself, left sided tight sensation and painful. This would resolved after 10-15 minutes of rest. She only would take a nitroglycerin when the pain was intolerable. She reports compliance with ASA, statin and prn nitro.  Patient started taking pantoprazole on 12/1. Since then, she has noticed lightheadedness in the morning and after climbing steps. She denies syncope. She denies any other new medications. She has been eating and drink well. The lightheadedness does not occur upon standing.   Patient admits to a 2 week history of non-productive cough. She states she feels the phlegm rolling around in her chest but she cannot bring it up. She has tried robitussin with some relief. She denies associated fever, chills, nasal congestion, nausea, vomiting or diarrhea. She admits to sick contacts recently. She denies history of asthma or COPD.   Past Medical History:  Diagnosis Date  . Breast cancer ([place])   . Hypertension   . Tenosynovitis of foot and ankle 09/28/2013    Review of Systems: Please see pertinent ROS reviewed in HPI and problem based charting.   Physical Exam: Vitals:   12/19/15 1518  BP: 127/78  Pulse: 62  Temp: 98.5 F (36.9 C)  TempSrc: Oral  SpO2: 98%  Weight: 254 lb 6.4 oz (115.4 kg)    Height: 5\' 6"  (1.676 m)   General: Vital signs reviewed.  Patient is well-developed and well-nourished, in no acute distress and cooperative with exam.  Eyes: PERRL, conjunctivae normal, no scleral icterus.  Neck: Supple, trachea midline, no anterior or posterior cervical lymphadenopathy.  Cardiovascular: RRR, S1 normal, S2 normal, no murmurs, gallops, or rubs. Pulmonary/Chest: Clear to auscultation bilaterally, no wheezes, rales, or rhonchi. No increased work of breathing Abdominal: Soft, non-tender, non-distended, BS + Extremities: No lower extremity edema bilaterally, pulses symmetric and intact bilaterally.  Skin: Warm, dry and intact. No rashes or erythema. Psychiatric: Normal mood and affect. speech and behavior is normal. Cognition and memory are normal.   Assessment & Plan:  See encounters tab for problem based medical decision making. Patient discussed with Dr. Beryle Beams

## 2015-12-18 NOTE — Telephone Encounter (Signed)
APT. REMINDER CALL, LMTCB °

## 2015-12-19 ENCOUNTER — Telehealth: Payer: Self-pay | Admitting: *Deleted

## 2015-12-19 ENCOUNTER — Ambulatory Visit (INDEPENDENT_AMBULATORY_CARE_PROVIDER_SITE_OTHER): Payer: Self-pay | Admitting: Internal Medicine

## 2015-12-19 ENCOUNTER — Encounter: Payer: Self-pay | Admitting: Internal Medicine

## 2015-12-19 DIAGNOSIS — Z5189 Encounter for other specified aftercare: Secondary | ICD-10-CM

## 2015-12-19 DIAGNOSIS — Z79899 Other long term (current) drug therapy: Secondary | ICD-10-CM

## 2015-12-19 DIAGNOSIS — R05 Cough: Secondary | ICD-10-CM

## 2015-12-19 DIAGNOSIS — J208 Acute bronchitis due to other specified organisms: Secondary | ICD-10-CM

## 2015-12-19 DIAGNOSIS — Z7982 Long term (current) use of aspirin: Secondary | ICD-10-CM

## 2015-12-19 DIAGNOSIS — R42 Dizziness and giddiness: Secondary | ICD-10-CM | POA: Insufficient documentation

## 2015-12-19 DIAGNOSIS — Z853 Personal history of malignant neoplasm of breast: Secondary | ICD-10-CM

## 2015-12-19 DIAGNOSIS — R0789 Other chest pain: Secondary | ICD-10-CM

## 2015-12-19 DIAGNOSIS — I10 Essential (primary) hypertension: Secondary | ICD-10-CM

## 2015-12-19 DIAGNOSIS — K219 Gastro-esophageal reflux disease without esophagitis: Secondary | ICD-10-CM

## 2015-12-19 MED ORDER — RANITIDINE HCL 150 MG PO TABS: 150.0000 mg | ORAL_TABLET | Freq: Two times a day (BID) | ORAL | 2 refills | Status: DC

## 2015-12-19 MED ORDER — ZOLPIDEM TARTRATE 5 MG PO TABS: 5.0000 mg | ORAL_TABLET | Freq: Every evening | ORAL | 5 refills | Status: DC | PRN

## 2015-12-19 MED ORDER — GUAIFENESIN-CODEINE 100-10 MG/5ML PO SOLN: 5.0000 mL | Freq: Every evening | ORAL | 0 refills | Status: DC | PRN

## 2015-12-19 MED ORDER — GUAIFENESIN-DM 100-10 MG/5ML PO SYRP: 5.0000 mL | ORAL_SOLUTION | ORAL | 0 refills | Status: DC | PRN

## 2015-12-19 MED FILL — raNITIdine HCL 150 MG TABS: 150 | 30 days supply | Qty: 60 | Fill #0

## 2015-12-19 NOTE — Patient Instructions (Signed)
For your chest pain: Continue taking aspirin once a day, pravastatin 40 mg daily, and nitroglycerin as needed. If your pain changes, becomes worse or increases in frequency, please let us or your cardiologist know.  Stop taking Protonix. If you lightheadedness goes away in one week, pick up ranitidine 150 mg twice a day for reflux.  For your cough: Take Robitussin during the day and Mucinex twice a day. Take Robitussin AC at night to help you sleep, but DO NOT take this with Ambien.

## 2015-12-19 NOTE — Assessment & Plan Note (Signed)
Patient started taking pantoprazole on 12/1. Since then, she has noticed lightheadedness in the morning and after climbing steps. She denies syncope. She denies any other new medications. She has been eating and drink well. The lightheadedness does not occur upon standing.   Assessment: Likely medication side effect. Other DDx include orthostatic hypotension, BPPV, Other Vertigo  Plan: -Discontinue Protonix -If resolved in one week, start ranitidine 150 mg BID -If no improvement, return for further evaluation including orthostatic vital signs, Epley's

## 2015-12-19 NOTE — Telephone Encounter (Signed)
"  Received message from this number to call to schedule mammogram.  A number was left in the message but I could not understand." Message left for new patient coordinator.

## 2015-12-19 NOTE — Assessment & Plan Note (Addendum)
One month ago, patient was seen in clinic for atypical chest pain. She was seen by Cardiology who performed a stress echo which was normal. Her palpitations improved after cutting back on caffeine. She was also instructed to start a PPI for GERD. Patient was instructed to continue ASA, statin and prn nitroglycerin. Patient states that she has not experienced the chest pain episode in the past 2 weeks. Previously when they occurred, it would come on after exerting herself, left sided tight sensation and painful. This would resolved after 10-15 minutes of rest. She only would take a nitroglycerin when the pain was intolerable. She reports compliance with ASA, statin and prn nitro.  Plan: -Continue ASA, nitro prn and pravastatin 40 mg daily

## 2015-12-19 NOTE — Assessment & Plan Note (Signed)
BP Readings from Last 3 Encounters:  12/19/15 127/78  12/14/15 128/72  11/26/15 124/78    Lab Results  Component Value Date   NA 141 10/10/2014   K 4.7 10/10/2014   CREATININE 0.67 10/10/2014    Assessment: Blood pressure control:  Well controlled Progress toward BP goal:   At goal Comments: Compliant with lisinopril-HCTZ 20-12.5 mg QD  Plan: Medications:  continue current medications

## 2015-12-19 NOTE — Assessment & Plan Note (Signed)
Patient admits to a 2 week history of non-productive cough. She states she feels the phlegm rolling around in her chest but she cannot bring it up. She has tried robitussin with some relief. She denies associated fever, chills, nasal congestion, nausea, vomiting or diarrhea. She admits to sick contacts recently. She denies history of asthma or COPD.   Assessment: Likely viral bronchitis. Other DDx include side effect from lisinopril  Plan: -Robitussin -Robitussin AC QHS -Rest, hydration -If no improvement, will do a trial off of lisinopril and consider a CXR

## 2015-12-19 NOTE — Progress Notes (Signed)
Medicine attending: Medical history, presenting problems, physical findings, and medications, reviewed with resident physician Dr Alexa Burns on the day of the patient visit and I concur with her evaluation and management plan. 

## 2015-12-22 NOTE — Progress Notes (Signed)
Subjective:  Patient presents today for heel pain to the right lower extremity for the past 2 months. Patient's parents a sharp pain and it hurts when she stands on the bottom of her foot. Patient states she is a borderline diabetic. Patient has a history of left foot surgery by Dr. Berkley Harvey.     Objective/Physical Exam General: The patient is alert and oriented x3 in no acute distress.  Dermatology: Skin is warm, dry and supple bilateral lower extremities. Negative for open lesions or macerations.  Vascular: Palpable pedal pulses bilaterally. No edema or erythema noted. Capillary refill within normal limits.  Neurological: Epicritic and protective threshold grossly intact bilaterally.   Musculoskeletal Exam: Pain on palpation range of motion to the right ankle joint both medial and lateral nature aspect. Pain on palpation to the plantar medial tubercle of the right calcaneus at the insertion of the plantar fascia. Range of motion within normal limits to all pedal and ankle joints bilateral. Muscle strength 5/5 in all groups bilateral.   Assessment: #1 plantar fasciitis right-improving #2 ankle joint synovitis with capsulitis right-healed   Plan of Care:  #1 Patient was evaluated. #2 injection of 0.5 mL Celestone Soluspan injected in the patient's right plantar fascia the insertion of the medial calcaneal tubercle without incident. #3 patient is to return to clinic when necessary   Edrick Kins, DPM [facility]  Dr. Edrick Kins, [place]                                        [place], [place] [address]                Office [phone]  Fax [phone]

## 2015-12-25 ENCOUNTER — Other Ambulatory Visit (HOSPITAL_COMMUNITY): Payer: Self-pay

## 2016-01-14 HISTORY — PX: CARPAL TUNNEL RELEASE: SHX101

## 2016-01-23 ENCOUNTER — Other Ambulatory Visit: Payer: Self-pay | Admitting: Internal Medicine

## 2016-01-23 ENCOUNTER — Telehealth: Payer: Self-pay | Admitting: Internal Medicine

## 2016-01-23 DIAGNOSIS — Z1231 Encounter for screening mammogram for malignant neoplasm of breast: Secondary | ICD-10-CM

## 2016-01-23 NOTE — Telephone Encounter (Signed)
APT. REMINDER CALL, LMTCB °

## 2016-01-24 ENCOUNTER — Ambulatory Visit (INDEPENDENT_AMBULATORY_CARE_PROVIDER_SITE_OTHER): Payer: Self-pay | Admitting: Internal Medicine

## 2016-01-24 ENCOUNTER — Ambulatory Visit
Admission: RE | Admit: 2016-01-24 | Discharge: 2016-01-24 | Disposition: A | Discharge status: Home / Self Care | Payer: No Typology Code available for payment source | Source: Ambulatory Visit | Attending: Internal Medicine | Admitting: Internal Medicine

## 2016-01-24 DIAGNOSIS — Z598 Other problems related to housing and economic circumstances: Secondary | ICD-10-CM

## 2016-01-24 DIAGNOSIS — Z1231 Encounter for screening mammogram for malignant neoplasm of breast: Secondary | ICD-10-CM

## 2016-01-24 DIAGNOSIS — G8929 Other chronic pain: Secondary | ICD-10-CM

## 2016-01-24 DIAGNOSIS — M5416 Radiculopathy, lumbar region: Secondary | ICD-10-CM

## 2016-01-24 MED FILL — NITROGLYCERIN 0.4 MG TAB SL: 0.4 | 13 days supply | Qty: 25 | Fill #1

## 2016-01-24 MED FILL — FLUoxetine HCL 40 MG CAPS: 40 | 30 days supply | Qty: 30 | Fill #1

## 2016-01-24 MED FILL — GABAPENTIN 300 MG CAPSULE: 300 | 30 days supply | Qty: 30 | Fill #1

## 2016-01-24 MED FILL — PRAVASTATIN NA 40 MG TAB: 40 | 30 days supply | Qty: 30 | Fill #1

## 2016-01-24 MED FILL — LISINOPRIL-HCTZ 20-12.5 MG: 20-12.5 | 30 days supply | Qty: 30 | Fill #1

## 2016-01-24 NOTE — Patient Instructions (Addendum)
[name],  I am sorry you are having such problem with your back and getting your medictions. I am working with our pharmacist to help you get some of your medications. I believe your pain is due to sciatica, the big nerve that runs in the area you are having pain.   I would like you to try ice/heat, naproxen and tylenol as needed and the gabapentin at night. The most important thing would be weight loss. I would like to try this for 6 weeks and have you follow up with Dr. Quay Sims. If you still are not doing any better at that point we may need to consider imaging to further look at things in your back.    Sciatica Sciatica is pain, numbness, weakness, or tingling along the path of the sciatic nerve. The sciatic nerve starts in the lower back and runs down the back of each leg. The nerve controls the muscles in the lower leg and in the back of the knee. It also provides feeling (sensation) to the back of the thigh, the lower leg, and the sole of the foot. Sciatica is a symptom of another medical condition that pinches or puts pressure on the sciatic nerve. Generally, sciatica only affects one side of the body. Sciatica usually goes away on its own or with treatment. In some cases, sciatica may keep coming back (recur). What are the causes? This condition is caused by pressure on the sciatic nerve, or pinching of the sciatic nerve. This may be the result of:  A disk in between the bones of the spine (vertebrae) bulging out too far (herniated disk).  Age-related changes in the spinal disks (degenerative disk disease).  A pain disorder that affects a muscle in the buttock (piriformis syndrome).  Extra bone growth (bone spur) near the sciatic nerve.  An injury or break (fracture) of the pelvis.  Pregnancy.  Tumor (rare). What increases the risk? The following factors may make you more likely to develop this condition:  Playing sports that place pressure or stress on the spine, such as  football or weight lifting.  Having poor strength and flexibility.  A history of back injury.  A history of back surgery.  Sitting for long periods of time.  Doing activities that involve repetitive bending or lifting.  Obesity. What are the signs or symptoms? Symptoms can vary from mild to very severe, and they may include:  Any of these problems in the lower back, leg, hip, or buttock:  Mild tingling or dull aches.  Burning sensations.  Sharp pains.  Numbness in the back of the calf or the sole of the foot.  Leg weakness.  Severe back pain that makes movement difficult. These symptoms may get worse when you cough, sneeze, or laugh, or when you sit or stand for long periods of time. Being overweight may also make symptoms worse. In some cases, symptoms may recur over time. How is this diagnosed? This condition may be diagnosed based on:  Your symptoms.  A physical exam. Your health care provider may ask you to do certain movements to check whether those movements trigger your symptoms.  You may have tests, including:  Blood tests.  X-rays.  MRI.  CT scan. How is this treated? In many cases, this condition improves on its own, without any treatment. However, treatment may include:  Reducing or modifying physical activity during periods of pain.  Exercising and stretching to strengthen your abdomen and improve the flexibility of your spine.  Icing and applying heat to the affected area.  Medicines that help:  To relieve pain and swelling.  To relax your muscles.  Injections of medicines that help to relieve pain, irritation, and inflammation around the sciatic nerve (steroids).  Surgery. Follow these instructions at home: Medicines  Take over-the-counter and prescription medicines only as told by your health care provider.  Do not drive or operate heavy machinery while taking prescription pain medicine. Managing pain  If directed, apply ice to  the affected area.  Put ice in a plastic bag.  Place a towel between your skin and the bag.  Leave the ice on for 20 minutes, 2-3 times a day.  After icing, apply heat to the affected area before you exercise or as often as told by your health care provider. Use the heat source that your health care provider recommends, such as a moist heat pack or a heating pad.  Place a towel between your skin and the heat source.  Leave the heat on for 20-30 minutes.  Remove the heat if your skin turns bright red. This is especially important if you are unable to feel pain, heat, or cold. You may have a greater risk of getting burned. Activity  Return to your normal activities as told by your health care provider. Ask your health care provider what activities are safe for you.  Avoid activities that make your symptoms worse.  Take brief periods of rest throughout the day. Resting in a lying or standing position is usually better than sitting to rest.  When you rest for longer periods, mix in some mild activity or stretching between periods of rest. This will help to prevent stiffness and pain.  Avoid sitting for long periods of time without moving. Get up and move around at least one time each hour.  Exercise and stretch regularly, as told by your health care provider.  Do not lift anything that is heavier than 10 lb (4.5 kg) while you have symptoms of sciatica. When you do not have symptoms, you should still avoid heavy lifting, especially repetitive heavy lifting.  When you lift objects, always use proper lifting technique, which includes:  Bending your knees.  Keeping the load close to your body.  Avoiding twisting. General instructions  Use good posture.  Avoid leaning forward while sitting.  Avoid hunching over while standing.  Maintain a healthy weight. Excess weight puts extra stress on your back and makes it difficult to maintain good posture.  Wear supportive, comfortable  shoes. Avoid wearing high heels.  Avoid sleeping on a mattress that is too soft or too hard. A mattress that is firm enough to support your back when you sleep may help to reduce your pain.  Keep all follow-up visits as told by your health care provider. This is important. Contact a health care provider if:  You have pain that wakes you up when you are sleeping.  You have pain that gets worse when you lie down.  Your pain is worse than you have experienced in the past.  Your pain lasts longer than 4 weeks.  You experience unexplained weight loss. Get help right away if:  You lose control of your bowel or bladder (incontinence).  You have:  Weakness in your lower back, pelvis, buttocks, or legs that gets worse.  Redness or swelling of your back.  A burning sensation when you urinate. This information is not intended to replace advice given to you by your health care provider. Make  sure you discuss any questions you have with your health care provider. Document Released: 12/24/2000 Document Revised: 06/05/2015 Document Reviewed: 09/08/2014 Elsevier Interactive Patient Education  2017 [employer].

## 2016-01-24 NOTE — Assessment & Plan Note (Signed)
Ms.Tysheka Padmore is a 56 y.o.  female with a past medical history listed below here today with complaints of low back pain. She has a history of chronic lower back pain for the past several years. She reports a sharp shooting pain down both the lateral posterior aspect of both her legs and worse on the right and right knee. The pain shoots down to her ankles bilaterally. She reports weakness in her right knee but has known arthritis and has been seen by orthopedic surgery and told that she needs a knee replacement. The pain is worst with activity and going up stairs. Reports some numbness in the back of her legs after sitting for long periods of time. No loss of bowel or bladder function. Symptoms have worsened over the past 2-3 months. She has had a weight gain from 254 lbs to 266lbs since last visit 12/06. She has been taking gabapentin 300 mg qhs with improvement in symptoms but reports being unable to afford her medications this month. She is no longer able to work due to her pain and is currently filing for disability. She says that her back pain has limited her ability to function and interfering with her ADLs. She is not able to cook or clean any more due to the pain. Reports even driving has become difficulty Of note, she reports financial difficulties and has not been able to get any of her medications since the start of 2018.   Plan Spoke with Dr. Maudie Mercury who contacted [facility]. She will be able to get all of her medications for free Will continue conservative treatment with Naproxen, Tylenol and Ice/Heat as no alarm symptoms present Strongly encouraged weight loss Continue Gabapentin 300 mg qhs RTC in 6 weeks, if no improvement can consider imaging at that time

## 2016-01-24 NOTE — Progress Notes (Signed)
   CC: Back Pain  HPI:  [name] is a 56 y.o.  female with a past medical history listed below here today with complaints of low back pain. She has a history of chronic lower back pain for the past several years. She reports a sharp shooting pain down both the lateral posterior aspect of both her legs and worse on the right and right knee. The pain shoots down to her ankles bilaterally. She reports weakness in her right knee but has known arthritis and has been seen by orthopedic surgery and told that she needs a knee replacement. The pain is worst with activity and going up stairs. Reports some numbness in the back of her legs after sitting for long periods of time. No loss of bowel or bladder function. Symptoms have worsened over the past 2-3 months. She has had a weight gain from 254 lbs to 266lbs since last visit 12/06. She has been taking gabapentin 300 mg qhs with improvement in symptoms but reports being unable to afford her medications this month. She is no longer able to work due to her pain and is currently filing for disability. She says that her back pain has limited her ability to function and interfering with her ADLs. She is not able to cook or clean any more due to the pain. Reports even driving has become difficulty.  Of note, she reports financial difficulties and has not been able to get any of her medications since the start of 2018.    Past Medical History:  Diagnosis Date  . Breast cancer ([place])   . Hypertension   . Tenosynovitis of foot and ankle 09/28/2013    Review of Systems:   Negative except as noted in HPI  Physical Exam:  Vitals:   01/24/16 0936  BP: 131/82  Pulse: 82  Temp: 98.5 F (36.9 C)  TempSrc: Oral  SpO2: 100%  Weight: 266 lb 6.4 oz (120.8 kg)    General: Vital signs reviewed.  Patient is obese, in no acute distress and cooperative with exam.  Cardiovascular: RRR, Pulmonary/Chest: Clear to auscultation bilaterally, no wheezes, rales,  or rhonchi. Musculoskeletal: +Low back pain and bilateral SI joint pain. I Extremities: No lower extremity edema bilaterally Neurological: A&O x3, Strength is normal and symmetric bilaterally, sensory intact to light touch bilaterally.  Skin: Warm, dry and intact.   Assessment & Plan:   See Encounters Tab for problem based charting.  Patient discussed with Dr. Evette Doffing

## 2016-01-25 NOTE — Progress Notes (Signed)
Internal Medicine Clinic Attending  Case discussed with Dr. Charlynn Grimes at the time of the visit.  We reviewed the resident's history and exam and pertinent patient test results.  I agree with the assessment, diagnosis, and plan of care documented in the resident's note.  No red flag symptoms to her back pain with radiculopathy, but there are positive yellow flag symptoms. Pain is dramatically affecting her functional status, so she is high risk for needing imaging and interventions in the future if she does not respond to conservative measures.

## 2016-01-30 ENCOUNTER — Ambulatory Visit: Payer: Self-pay

## 2016-02-18 ENCOUNTER — Telehealth: Payer: Self-pay | Admitting: Internal Medicine

## 2016-02-18 NOTE — Telephone Encounter (Signed)
APT. REMINDER CALL, LMTCB °

## 2016-02-19 ENCOUNTER — Ambulatory Visit: Payer: No Typology Code available for payment source

## 2016-02-25 NOTE — Progress Notes (Deleted)
Cardiology Office Note    Date:  02/25/2016   ID:  Maria Sims, DOB [birth year], MRN [id number]  PCP:  Florinda Marker, MD  Cardiologist:  Dr. Johnsie Cancel   Chief Complaint: Chest pain follow up  History of Present Illness:   Maria Sims is a 56 y.o. female  With PMH of HTN, HLD, OA, chronic lumbar radiculopathy and R breast cancer who presented for chest pain follow up.   Seen my me  11/26/15 for chest pain. Her chest pain was atypical. Started on trial of PPI and stress echo is normal.  Also advised to cut back on salt for intermittent LE Edema. Palpitations better with less caffeine   Here today for follow up.   Past Medical History:  Diagnosis Date  . Breast cancer ([place])   . Hypertension   . Tenosynovitis of foot and ankle 09/28/2013    Past Surgical History:  Procedure Laterality Date  . ABDOMINAL HYSTERECTOMY    . BREAST LUMPECTOMY    . BREAST RECONSTRUCTION    . BUNIONECTOMY Left 01/26/2014   @ [facility]  . CHOLECYSTECTOMY    . COTTON OSTEOTOMY W/ BONE GRAFT Left 01/26/2014   @ [place]  . KNEE SURGERY    . NECK SURGERY      Current Medications: Prior to Admission medications   Medication Sig Start Date End Date Taking? Authorizing Provider  aspirin EC 81 MG tablet Take 1 tablet (81 mg total) by mouth daily. 11/22/15 11/21/16  Ledell Noss, MD  cyclobenzaprine (FLEXERIL) 5 MG tablet Take 1 tablet (5 mg total) by mouth at bedtime as needed for muscle spasms. 09/13/15   Bethany Molt, DO  FLUoxetine (PROZAC) 40 MG capsule Take 1 capsule (40 mg total) by mouth daily. 11/22/15   Ledell Noss, MD  gabapentin (NEURONTIN) 300 MG capsule Take 1 capsule (300 mg total) by mouth at bedtime. 11/22/15 11/21/16  Ledell Noss, MD  lisinopril-hydrochlorothiazide (PRINZIDE,ZESTORETIC) 20-12.5 MG tablet Take 1 tablet by mouth daily. 11/22/15   Ledell Noss, MD  naproxen (NAPROSYN) 500 MG tablet Take 500 mg by mouth 2 (two) times daily with a meal.    Historical Provider, MD    nitroGLYCERIN (NITROSTAT) 0.4 MG SL tablet Place 1 tablet (0.4 mg total) under the tongue every 5 (five) minutes as needed for chest pain. 11/22/15 11/21/16  Ledell Noss, MD  pantoprazole (PROTONIX) 40 MG tablet Take 1 tablet (40 mg total) by mouth daily. 11/26/15   Bhavinkumar Bhagat, PA  pravastatin (PRAVACHOL) 40 MG tablet Take 1 tablet (40 mg total) by mouth daily. 11/22/15   Ledell Noss, MD  zolpidem (AMBIEN) 5 MG tablet Take 1 tablet (5 mg total) by mouth at bedtime as needed for sleep. 10/11/14   Florinda Marker, MD    Allergies:   Pineapple   Social History   Social History  . Marital status: Single    Spouse name: N/A  . Number of children: N/A  . Years of education: N/A   Social History Main Topics  . Smoking status: Never Smoker  . Smokeless tobacco: Never Used  . Alcohol use 0.0 oz/week     Comment: Socially.  . Drug use: No  . Sexual activity: Not on file   Other Topics Concern  . Not on file   Social History Narrative  . No narrative on file     Family History:  The patient's family history includes Diabetes in her father; Hypertension in her mother.   ROS:  Please see the history of present illness.    ROS All other systems reviewed and are negative.   PHYSICAL EXAM:    VS:  There were no vitals taken for this visit.   GEN: Well nourished, well developed, in no acute distress  HEENT: normal  Neck: no JVD, carotid bruits, or masses Cardiac:RRR; no murmurs, rubs, or gallops,no edema  Respiratory:  clear to auscultation bilaterally, normal work of breathing GI: soft, nontender, nondistended, + BS MS: no deformity or atrophy  Skin: warm and dry, no rash Neuro:  Alert and Oriented x 3, Strength and sensation are intact Psych: euthymic mood, full affect  Wt Readings from Last 3 Encounters:  01/24/16 266 lb 6.4 oz (120.8 kg)  12/19/15 254 lb 6.4 oz (115.4 kg)  12/14/15 255 lb 8 oz (115.9 kg)      Studies/Labs Reviewed:   EKG:  EKG is not ordered today.     Recent Labs: 06/06/2015: Platelets 306   Lipid Panel    Component Value Date/Time   CHOL 283 (H) 10/10/2014 1126   TRIG 161 (H) 10/10/2014 1126   HDL 62 10/10/2014 1126   CHOLHDL 4.6 (H) 10/10/2014 1126   LDLCALC 189 (H) 10/10/2014 1126    Additional studies/ records that were reviewed today include:   Stress Echocardiogram: 12/12/15  Study Conclusions  - Stress ECG conclusions: The stress ECG was negative for ischemia. - Staged echo: There was no echocardiographic evidence for   stress-induced ischemia.  Impressions:  - Stress echo with no chest pain, no ST changes and no stress   induced wall motion abnormalities.  Stress results:   Maximal heart rate during stress was 160 bpm (96% of maximal predicted heart rate). The maximal predicted heart rate was 166 bpm.The target heart rate was achieved. The heart rate response to stress was normal. There was a normal resting blood pressure with an appropriate response to stress. The rate-pressure product for the peak heart rate and blood pressure was 17542 mm Hg/min.  The patient experienced no chest pain during stress.  ------------------------------------------------------------------- Stress ECG:   Rare atrial ectopy.  The stress ECG was negative for ischemia.  ------------------------------------------------------------------- Baseline:  - LV size was normal. - LV global systolic function was normal. The estimated LV ejection   fraction was 60%. - Normal wall motion; no LV regional wall motion abnormalities.  Peak stress:  - LV size was reduced appropriately. - LV global systolic function was vigorous. The estimated LV   ejection fraction was 80%. - No evidence for new LV regional wall motion abnormalities.  ------------------------------------------------------------------- Stress echo results:     Left ventricular ejection fraction was normal at rest and with stress. There was no  echocardiographic evidence for stress-induced ischemia.     ASSESSMENT & PLAN:    1. Chest pain - Stress echo is normal. One episode of chest pain that resolved with SL nitro x 1. She will start trial of PPI. Continue ASA and stain. PRN nitro. IF her symptoms gets worse or requires more SL nitro she will let us know. She has baseline bradycardia. Not on any AV blocking agent.   2. Palpitations - Improved with cut back on caffeine. Advice complete cessation. If no improvement will get monitor in future.   3. HLD - managed by PCP suggest starting statin or consider calcium score to guide Rx  4. HTN - Stable and well controlled.  5. R leg pain - Consistent with chronic lumbar radiculopathy    [name]  [employer]

## 2016-02-28 ENCOUNTER — Ambulatory Visit: Payer: No Typology Code available for payment source

## 2016-03-04 NOTE — Progress Notes (Signed)
 Cardiology Office Note    Date:  03/14/2016   ID:  Maria Sims, DOB 07/30/1960, MRN [id number]  PCP:  Burns, Alexa R, MD  Cardiologist:  Dr. Tewana Bohlen   Chief Complaint: Chest pain follow up  History of Present Illness:   Maria Sims is a 56 y.o. female  With PMH of HTN, HLD, OA, chronic lumbar radiculopathy and R breast cancer who presented for chest pain follow up.   Seen my me  11/26/15 for chest pain. Her chest pain was atypical. Started on trial of PPI and stress echo is normal.  Also advised to cut back on salt for intermittent LE Edema. Palpitations better with less caffeine   Here today for follow up. Still have squeezing SSCP on left. Has taken nitro with relief on at least 5 occasions Pain lasting minutes. Sometimes exertional but also at church. No associated dyspnea or syncope   Past Medical History:  Diagnosis Date  . Breast cancer (HCC)   . Hypertension   . Tenosynovitis of foot and ankle 09/28/2013    Past Surgical History:  Procedure Laterality Date  . ABDOMINAL HYSTERECTOMY    . BREAST LUMPECTOMY    . BREAST RECONSTRUCTION    . BUNIONECTOMY Left 01/26/2014   @ [facility]  . CHOLECYSTECTOMY    . COTTON OSTEOTOMY W/ BONE GRAFT Left 01/26/2014   @ [facility]  . KNEE SURGERY    . NECK SURGERY      Current Medications: Prior to Admission medications   Medication Sig Start Date End Date Taking? Authorizing Provider  aspirin EC 81 MG tablet Take 1 tablet (81 mg total) by mouth daily. 11/22/15 11/21/16  Nina Blum, MD  cyclobenzaprine (FLEXERIL) 5 MG tablet Take 1 tablet (5 mg total) by mouth at bedtime as needed for muscle spasms. 09/13/15   Bethany Molt, DO  FLUoxetine (PROZAC) 40 MG capsule Take 1 capsule (40 mg total) by mouth daily. 11/22/15   Nina Blum, MD  gabapentin (NEURONTIN) 300 MG capsule Take 1 capsule (300 mg total) by mouth at bedtime. 11/22/15 11/21/16  Nina Blum, MD  lisinopril-hydrochlorothiazide (PRINZIDE,ZESTORETIC) 20-12.5 MG tablet  Take 1 tablet by mouth daily. 11/22/15   Nina Blum, MD  naproxen (NAPROSYN) 500 MG tablet Take 500 mg by mouth 2 (two) times daily with a meal.    Historical Provider, MD  nitroGLYCERIN (NITROSTAT) 0.4 MG SL tablet Place 1 tablet (0.4 mg total) under the tongue every 5 (five) minutes as needed for chest pain. 11/22/15 11/21/16  Nina Blum, MD  pantoprazole (PROTONIX) 40 MG tablet Take 1 tablet (40 mg total) by mouth daily. 11/26/15   Bhavinkumar Bhagat, PA  pravastatin (PRAVACHOL) 40 MG tablet Take 1 tablet (40 mg total) by mouth daily. 11/22/15   Nina Blum, MD  zolpidem (AMBIEN) 5 MG tablet Take 1 tablet (5 mg total) by mouth at bedtime as needed for sleep. 10/11/14   Alexa R Burns, MD    Allergies:   Pineapple   Social History   Social History  . Marital status: Single    Spouse name: N/A  . Number of children: N/A  . Years of education: N/A   Social History Main Topics  . Smoking status: Never Smoker  . Smokeless tobacco: Never Used  . Alcohol use 0.0 oz/week     Comment: Socially.  . Drug use: No  . Sexual activity: Not Asked   Other Topics Concern  . None   Social History Narrative  . None       Family History:  The patient's family history includes Diabetes in her father; Hypertension in her mother.   ROS:   Please see the history of present illness.    ROS All other systems reviewed and are negative.   PHYSICAL EXAM:    VS:  BP 120/70   Pulse 75   Ht 5' 6" (1.676 m)   Wt 253 lb 12.8 oz (115.1 kg)   SpO2 96%   BMI 40.96 kg/m    GEN: Well nourished, well developed, in no acute distress  HEENT: normal  Neck: no JVD, carotid bruits, or masses Cardiac:RRR; no murmurs, rubs, or gallops,no edema  Respiratory:  clear to auscultation bilaterally, normal work of breathing GI: soft, nontender, nondistended, + BS MS: no deformity or atrophy  Skin: warm and dry, no rash Neuro:  Alert and Oriented x 3, Strength and sensation are intact Psych: euthymic mood, full  affect  Wt Readings from Last 3 Encounters:  03/14/16 253 lb 12.8 oz (115.1 kg)  03/13/16 257 lb (116.6 kg)  03/05/16 257 lb 12.8 oz (116.9 kg)      Studies/Labs Reviewed:   EKG:  EKG  SR rate 71 normal    Recent Labs: 06/06/2015: Platelets 306 03/05/2016: BUN 11; Creatinine, Ser 0.80; Potassium 4.2; Sodium 139   Lipid Panel    Component Value Date/Time   CHOL 283 (H) 10/10/2014 1126   TRIG 161 (H) 10/10/2014 1126   HDL 62 10/10/2014 1126   CHOLHDL 4.6 (H) 10/10/2014 1126   LDLCALC 189 (H) 10/10/2014 1126    Additional studies/ records that were reviewed today include:   Stress Echocardiogram: 12/12/15  Study Conclusions  - Stress ECG conclusions: The stress ECG was negative for ischemia. - Staged echo: There was no echocardiographic evidence for   stress-induced ischemia.  Impressions:  - Stress echo with no chest pain, no ST changes and no stress   induced wall motion abnormalities.  Stress results:   Maximal heart rate during stress was 160 bpm (96% of maximal predicted heart rate). The maximal predicted heart rate was 166 bpm.The target heart rate was achieved. The heart rate response to stress was normal. There was a normal resting blood pressure with an appropriate response to stress. The rate-pressure product for the peak heart rate and blood pressure was 17542 mm Hg/min.  The patient experienced no chest pain during stress.  ------------------------------------------------------------------- Stress ECG:   Rare atrial ectopy.  The stress ECG was negative for ischemia.  ------------------------------------------------------------------- Baseline:  - LV size was normal. - LV global systolic function was normal. The estimated LV ejection   fraction was 60%. - Normal wall motion; no LV regional wall motion abnormalities.  Peak stress:  - LV size was reduced appropriately. - LV global systolic function was vigorous. The estimated LV    ejection fraction was 80%. - No evidence for new LV regional wall motion abnormalities.  ------------------------------------------------------------------- Stress echo results:     Left ventricular ejection fraction was normal at rest and with stress. There was no echocardiographic evidence for stress-induced ischemia.     ASSESSMENT & PLAN:    1. Chest pain - Stress echo is normal. Continued episodes of pain responsive to nitro discussed options with patient Favor Diagnostic cath. Risks including stroke, bleeding MI and need for emergency surgery discussed willing to proceed Have arranged with Dr Jordan next week Lab called blood work today orders written. Will need versed/fentanyl For sedation   2. Palpitations - Improved with cut back on caffeine.   Advice complete cessation. If no improvement will get monitor in future.   3. HLD - managed by PCP suggest starting statin or consider calcium score to guide Rx  4. HTN - Stable and well controlled.  5. R leg pain - Consistent with chronic lumbar radiculopathy    Analiz Tvedt  

## 2016-03-05 ENCOUNTER — Encounter: Payer: Self-pay | Admitting: Internal Medicine

## 2016-03-05 ENCOUNTER — Encounter (INDEPENDENT_AMBULATORY_CARE_PROVIDER_SITE_OTHER): Payer: Self-pay

## 2016-03-05 ENCOUNTER — Ambulatory Visit (INDEPENDENT_AMBULATORY_CARE_PROVIDER_SITE_OTHER): Payer: Self-pay | Admitting: Internal Medicine

## 2016-03-05 ENCOUNTER — Other Ambulatory Visit: Payer: Self-pay | Admitting: Internal Medicine

## 2016-03-05 VITALS — BP 128/77 | HR 72 | Temp 98.6°F | Ht 66.0 in | Wt 257.8 lb

## 2016-03-05 DIAGNOSIS — G5601 Carpal tunnel syndrome, right upper limb: Secondary | ICD-10-CM

## 2016-03-05 DIAGNOSIS — Z0000 Encounter for general adult medical examination without abnormal findings: Secondary | ICD-10-CM

## 2016-03-05 DIAGNOSIS — Z79899 Other long term (current) drug therapy: Secondary | ICD-10-CM

## 2016-03-05 DIAGNOSIS — E7800 Pure hypercholesterolemia, unspecified: Secondary | ICD-10-CM

## 2016-03-05 DIAGNOSIS — Z6841 Body Mass Index (BMI) 40.0 and over, adult: Secondary | ICD-10-CM

## 2016-03-05 DIAGNOSIS — F331 Major depressive disorder, recurrent, moderate: Secondary | ICD-10-CM

## 2016-03-05 DIAGNOSIS — Z124 Encounter for screening for malignant neoplasm of cervix: Secondary | ICD-10-CM

## 2016-03-05 DIAGNOSIS — M5416 Radiculopathy, lumbar region: Secondary | ICD-10-CM

## 2016-03-05 DIAGNOSIS — G8929 Other chronic pain: Secondary | ICD-10-CM

## 2016-03-05 DIAGNOSIS — Z1211 Encounter for screening for malignant neoplasm of colon: Secondary | ICD-10-CM

## 2016-03-05 DIAGNOSIS — M25461 Effusion, right knee: Secondary | ICD-10-CM

## 2016-03-05 DIAGNOSIS — E6609 Other obesity due to excess calories: Secondary | ICD-10-CM

## 2016-03-05 DIAGNOSIS — R7303 Prediabetes: Secondary | ICD-10-CM

## 2016-03-05 DIAGNOSIS — I10 Essential (primary) hypertension: Secondary | ICD-10-CM

## 2016-03-05 MED ORDER — RANITIDINE HCL 150 MG PO TABS: 150.0000 mg | ORAL_TABLET | Freq: Two times a day (BID) | ORAL | 2 refills | Status: DC

## 2016-03-05 MED ORDER — PRAVASTATIN SODIUM 40 MG PO TABS: 40.0000 mg | ORAL_TABLET | Freq: Every day | ORAL | 11 refills | Status: DC

## 2016-03-05 MED ORDER — LISINOPRIL-HYDROCHLOROTHIAZIDE 20-12.5 MG PO TABS: 1.0000 | ORAL_TABLET | Freq: Every day | ORAL | 11 refills | Status: DC

## 2016-03-05 MED ORDER — FLUOXETINE HCL 40 MG PO CAPS: 40.0000 mg | ORAL_CAPSULE | Freq: Every day | ORAL | 11 refills | Status: DC

## 2016-03-05 MED ORDER — NAPROXEN 500 MG PO TABS: 500.0000 mg | ORAL_TABLET | Freq: Two times a day (BID) | ORAL | 1 refills | Status: DC

## 2016-03-05 MED FILL — raNITIdine HCL 150 MG TABS: 150 | 30 days supply | Qty: 60 | Fill #0

## 2016-03-05 MED FILL — PRAVASTATIN NA 40 MG TAB: 40 | 30 days supply | Qty: 30 | Fill #0

## 2016-03-05 MED FILL — LISINOPRIL-HCTZ 20-12.5 MG: 20-12.5 | 30 days supply | Qty: 30 | Fill #0

## 2016-03-05 MED FILL — NAPROXEN 500 MG TABLET: 500 | 30 days supply | Qty: 60 | Fill #0

## 2016-03-05 MED FILL — FLUoxetine HCL 40 MG CAPS: 40 | 30 days supply | Qty: 30 | Fill #0

## 2016-03-05 NOTE — Assessment & Plan Note (Signed)
Patient reports compliance with pravastatin 40 mg once a day. She denies myalgias  Plan: -Continue pravastatin 40 mg once a day

## 2016-03-05 NOTE — Assessment & Plan Note (Addendum)
Stool cards negative in January 2017. We will repeat this today. Patient is due for Pap smear. We will complete this today.

## 2016-03-05 NOTE — Assessment & Plan Note (Signed)
Patient reports compliance with fluoxetine 40 mg daily and good control of her depressive symptoms on this dose. In the future, we could consider switching to Cymbalta to also assist with chronic pain.  Plan: -Continue fluoxetine 40 mg daily

## 2016-03-05 NOTE — Assessment & Plan Note (Signed)
Patient underwent right carpal tunnel release surgery at [facility] in fall 2017. She did not heed the surgeon's instructions as far as decreasing her activity, in fact she has been bowling using her right wrist. Apparently, the surgeons that she may need future surgery on the tendon. She did not follow back up with them after surgery and she continues to have pain with overuse.  Plan: -Naproxen 500 mg twice a day with meals -Follow-up with hand surgery at [place]

## 2016-03-05 NOTE — Assessment & Plan Note (Signed)
Patient has a history of chronic lumbar radiculopathy. She states the pain is located in her lower back and radiates down both legs. At times if she is laying in the wrong position she will have a sharp shooting pain down her lower extremities. She has not had imaging of her lumbar spine. She denies weakness or numbness in her lower extremities. She has trace lower extremity edema. She takes gabapentin 300 mg at night which helps, but she is unable to tolerate a higher dose of this or medication throughout the day due to somnolence. She was prescribed naproxen 500 mg twice a day with meals, but patient takes this infrequently as she does not like to take medications. She denies any bowel or bladder incontinence.  Assessment: Chronic lumbar radiculopathy without red flags  Plan: -Continue gabapentin 300 mg daily at bedtime -Discussed importance of taking NSAIDs to help with pain, continue naproxen 500 mg twice a day with meals -If patient fails this regimen we could consider a trial of meloxicam -Weight loss

## 2016-03-05 NOTE — Assessment & Plan Note (Addendum)
BP Readings from Last 3 Encounters:  03/05/16 128/77  01/24/16 131/82  12/19/15 127/78    Lab Results  Component Value Date   NA 141 10/10/2014   K 4.7 10/10/2014   CREATININE 0.67 10/10/2014    Assessment: Blood pressure control:  controlled Progress toward BP goal:   at goal Comments: Compliant with lisinopril-HCTZ 20-12.5 mg daily  Plan: Medications:  continue current medications Other plans: f/u in 3 months, repeat basic metabolic panel today

## 2016-03-05 NOTE — Assessment & Plan Note (Signed)
Hemoglobin A1c was 6.3 in May 2017. We'll repeat A1c on 3 month follow-up.

## 2016-03-05 NOTE — Patient Instructions (Signed)
Continue taking all medications as prescribed. Remember to take gabapentin at night and naproxen 500 mg twice a day with meals for your pain. Please follow up with [facility] orthopedic surgery for your right knee osteoarthritis and right wrist carpal tunnel syndrome.  Please follow up with me in 3 months. Please complete your stool cards.

## 2016-03-05 NOTE — Progress Notes (Signed)
    CC: Follow-up for hypertension  HPI: [name] is a 56 y.o. female with PMHx of hypertension, depression, insomnia, reflux who presents to the clinic for follow-up for hypertension.   Please see problem based assessment and plan for more information. Patient denies chest pain, shortness of breath, wheezing. She is eating and drinking well.  Patient does continue to have right wrist pain, low back pain, and right knee pain. Please see her specific diagnoses for more information.  Past Medical History:  Diagnosis Date  . Breast cancer ([place])   . Hypertension   . Tenosynovitis of foot and ankle 09/28/2013    Review of Systems: Please see pertinent ROS reviewed in HPI and problem based charting.   Physical Exam: Vitals:   03/05/16 1452  BP: 128/77  Pulse: 72  Temp: 98.6 F (37 C)  TempSrc: Oral  SpO2: 98%  Weight: 257 lb 12.8 oz (116.9 kg)  Height: 5\' 6"  (1.676 m)   General: Vital signs reviewed.  Patient is well-developed and well-nourished, in no acute distress and cooperative with exam.  Neck: Supple, trachea midline, no carotid bruit present.  Cardiovascular: RRR, S1 normal, S2 normal, no murmurs, gallops, or rubs. Pulmonary/Chest: Clear to auscultation bilaterally, no wheezes, rales, or rhonchi. Abdominal: Soft, non-tender, non-distended, BS +, Obese.  Musculoskeletal: Right tenderness with manipulation. Well-healed scar over right wrist. Tender right knee, with minimal palpable effusion.  Extremities: Trace lower extremity edema bilaterally, pulses symmetric and intact bilaterally.  Neurological: Strength is normal and symmetric bilaterally and sensory intact to light touch bilaterally in lower extremities.  Skin: Warm, dry and intact. No rashes or erythema. Psychiatric: Normal mood and affect. speech and behavior is normal. Cognition and memory are normal.   Assessment & Plan:  See encounters tab for problem based medical decision making. Patient discussed  with Dr. Evette Doffing

## 2016-03-06 ENCOUNTER — Ambulatory Visit: Payer: Self-pay | Admitting: Cardiovascular Disease

## 2016-03-06 LAB — BMP8+ANION GAP
Anion Gap: 19 — ABNORMAL HIGH (ref 10.0–18.0)
BUN / CREAT RATIO: 14 (ref 9–23)
BUN: 11 mg/dL (ref 6–24)
CALCIUM: 9.8 mg/dL (ref 8.7–10.2)
CO2: 22 mmol/L (ref 18–29)
CREATININE: 0.8 mg/dL (ref 0.57–1.00)
Chloride: 98 mmol/L (ref 96–106)
GFR, EST AFRICAN AMERICAN: 96 (ref >59)
GFR, EST NON AFRICAN AMERICAN: 83 (ref >59)
Glucose: 89 mg/dL (ref 65–99)
Potassium: 4.2 mmol/L (ref 3.5–5.2)
Sodium: 139 mmol/L (ref 134–144)

## 2016-03-06 NOTE — Progress Notes (Signed)
Internal Medicine Clinic Attending  Case discussed with Dr. Burns at the time of the visit.  We reviewed the resident's history and exam and pertinent patient test results.  I agree with the assessment, diagnosis, and plan of care documented in the resident's note.  

## 2016-03-10 ENCOUNTER — Encounter: Payer: Self-pay | Admitting: Internal Medicine

## 2016-03-10 LAB — CYTOLOGY - PAP
ADEQUACY: ABSENT
DIAGNOSIS: NEGATIVE
HPV: NOT DETECTED

## 2016-03-13 ENCOUNTER — Emergency Department (HOSPITAL_BASED_OUTPATIENT_CLINIC_OR_DEPARTMENT_OTHER): Payer: MEDICAID

## 2016-03-13 ENCOUNTER — Encounter (HOSPITAL_BASED_OUTPATIENT_CLINIC_OR_DEPARTMENT_OTHER): Payer: Self-pay | Admitting: Emergency Medicine

## 2016-03-13 ENCOUNTER — Emergency Department (HOSPITAL_BASED_OUTPATIENT_CLINIC_OR_DEPARTMENT_OTHER)
Admission: EM | Admit: 2016-03-13 | Discharge: 2016-03-13 | Disposition: A | Discharge status: Home / Self Care | Payer: MEDICAID | Attending: Emergency Medicine | Admitting: Emergency Medicine

## 2016-03-13 DIAGNOSIS — Z853 Personal history of malignant neoplasm of breast: Secondary | ICD-10-CM | POA: Insufficient documentation

## 2016-03-13 DIAGNOSIS — Z7982 Long term (current) use of aspirin: Secondary | ICD-10-CM | POA: Insufficient documentation

## 2016-03-13 DIAGNOSIS — I10 Essential (primary) hypertension: Secondary | ICD-10-CM | POA: Insufficient documentation

## 2016-03-13 DIAGNOSIS — R52 Pain, unspecified: Secondary | ICD-10-CM

## 2016-03-13 DIAGNOSIS — L03012 Cellulitis of left finger: Secondary | ICD-10-CM

## 2016-03-13 DIAGNOSIS — Z79899 Other long term (current) drug therapy: Secondary | ICD-10-CM | POA: Insufficient documentation

## 2016-03-13 MED ORDER — CEPHALEXIN 500 MG PO CAPS: 500.0000 mg | ORAL_CAPSULE | Freq: Four times a day (QID) | ORAL | 0 refills | Status: DC

## 2016-03-13 MED ORDER — HYDROCODONE-ACETAMINOPHEN 5-325 MG PO TABS: 2.0000 | ORAL_TABLET | ORAL | 0 refills | Status: DC | PRN

## 2016-03-13 MED ORDER — BACITRACIN ZINC 500 UNIT/GM EX OINT
TOPICAL_OINTMENT | Freq: Two times a day (BID) | CUTANEOUS | Status: DC
  Administered 2016-03-13: 19:00:00 via TOPICAL
  Filled 2016-03-13: qty 28.35

## 2016-03-13 MED ORDER — LIDOCAINE-EPINEPHRINE-TETRACAINE (LET) SOLUTION
3.0000 mL | Freq: Once | NASAL | Status: AC
  Administered 2016-03-13: 3 mL via TOPICAL
  Filled 2016-03-13: qty 3

## 2016-03-13 MED ORDER — ACETAMINOPHEN 325 MG PO TABS
650.0000 mg | ORAL_TABLET | Freq: Once | ORAL | Status: AC
  Administered 2016-03-13: 650 mg via ORAL
  Filled 2016-03-13: qty 2

## 2016-03-13 MED ORDER — LIDOCAINE HCL (PF) 1 % IJ SOLN
5.0000 mL | Freq: Once | INTRAMUSCULAR | Status: AC
  Administered 2016-03-13: 5 mL
  Filled 2016-03-13: qty 5

## 2016-03-13 NOTE — ED Notes (Signed)
ED Provider at bedside. 

## 2016-03-13 NOTE — ED Triage Notes (Signed)
Pt with left thumb swelling and redness x3 days. Denies injury.

## 2016-03-13 NOTE — Discharge Instructions (Signed)
Please take antibiotics as prescribed. Motrin Tylenol for pain. Warm soak. Keep wound clean and dry. Wound recheck in 2-3 days. Continue follow-up with hand surgeon if symptoms not improved. Return sooner if your symptoms worsen or she develop worsening redness, fevers or for any reason.

## 2016-03-13 NOTE — ED Provider Notes (Signed)
[facility] Provider Note   CSN: [id number] Arrival date & time: 03/13/16  [id number]  By signing my name below, I, Jeanell Sparrow, attest that this documentation has been prepared under the direction and in the presence of non-physician practitioner, Melina Schools, PA-C. Electronically Signed: Jeanell Sparrow, Scribe. 03/13/2016. 5:02 PM.  History   Chief Complaint Chief Complaint  Patient presents with  . thumb pain  . Joint Swelling   The history is provided by the patient. No language interpreter was used.   HPI Comments: Maria Sims is a 56 y.o. female who presents to the Emergency Department complaining of constant moderate left thumb pain that started about 2 days ago. No known thumb injury. She has tried pain medication without relief. She reports associated swelling and redness. Her symptoms have been worsening. She denies any fever, nausea, vomiting. or other complaints.     PCP: Florinda Marker, MD  Past Medical History:  Diagnosis Date  . Breast cancer ([place])   . Hypertension   . Tenosynovitis of foot and ankle 09/28/2013    Patient Active Problem List   Diagnosis Date Noted  . Atypical chest pain 11/22/2015  . Pes planus of both feet 09/17/2015  . Plantar fasciitis of right foot 09/13/2015  . Restless leg syndrome 06/06/2015  . Right carpal tunnel syndrome 06/01/2015  . Lumbar radiculopathy, chronic 03/28/2015  . Morton neuroma 02/08/2015  . Osteoarthritis of right knee 01/03/2015  . Myopia of both eyes 11/29/2014  . Hypercholesteremia 10/11/2014  . Essential hypertension 10/10/2014  . Healthcare maintenance 10/10/2014  . Prediabetes 10/10/2014  . Depression, major, recurrent, moderate ([place]) 06/08/2014  . Equinus deformity of foot, acquired 08/24/2013    Past Surgical History:  Procedure Laterality Date  . ABDOMINAL HYSTERECTOMY    . BREAST LUMPECTOMY    . BREAST RECONSTRUCTION    . BUNIONECTOMY Left 01/26/2014   @ [facility]  .  CHOLECYSTECTOMY    . COTTON OSTEOTOMY W/ BONE GRAFT Left 01/26/2014   @ [facility]  . KNEE SURGERY    . NECK SURGERY      OB History    No data available       Home Medications    Prior to Admission medications   Medication Sig Start Date End Date Taking? Authorizing Provider  aspirin EC 81 MG tablet Take 1 tablet (81 mg total) by mouth daily. 11/22/15 11/21/16 Yes Ledell Noss, MD  FLUoxetine (PROZAC) 40 MG capsule Take 1 capsule (40 mg total) by mouth daily. 03/05/16  Yes Alexa Angela Burke, MD  gabapentin (NEURONTIN) 300 MG capsule Take 1 capsule (300 mg total) by mouth at bedtime. 11/22/15 11/21/16 Yes Ledell Noss, MD  lisinopril-hydrochlorothiazide (PRINZIDE,ZESTORETIC) 20-12.5 MG tablet Take 1 tablet by mouth daily. 03/05/16  Yes Alexa Angela Burke, MD  naproxen (NAPROSYN) 500 MG tablet Take 1 tablet (500 mg total) by mouth 2 (two) times daily with a meal. 03/05/16  Yes Alexa Angela Burke, MD  nitroGLYCERIN (NITROSTAT) 0.4 MG SL tablet Place 1 tablet (0.4 mg total) under the tongue every 5 (five) minutes as needed for chest pain. 11/22/15 11/21/16 Yes Ledell Noss, MD  pravastatin (PRAVACHOL) 40 MG tablet Take 1 tablet (40 mg total) by mouth daily. 03/05/16  Yes Alexa Angela Burke, MD  ranitidine (ZANTAC) 150 MG tablet Take 1 tablet (150 mg total) by mouth 2 (two) times daily. 03/05/16  Yes Alexa Angela Burke, MD    Family History Family History  Problem Relation Age of Onset  .  Hypertension Mother   . Diabetes Father     Social History Social History  Substance Use Topics  . Smoking status: Never Smoker  . Smokeless tobacco: Never Used  . Alcohol use 0.0 oz/week     Comment: Socially.     Allergies   Pineapple   Review of Systems Review of Systems  Constitutional: Negative for fever.  Gastrointestinal: Negative for nausea and vomiting.  Musculoskeletal: Positive for joint swelling and myalgias (Left thumb).  Skin: Positive for color change.  All other systems reviewed and are negative.    Physical  Exam Updated Vital Signs BP 126/88 (BP Location: Left Arm)   Pulse 85   Temp 98.3 F (36.8 C) (Oral)   Resp 18   Ht 5\' 6"  (1.676 m)   Wt 257 lb (116.6 kg)   SpO2 98%   BMI 41.48 kg/m   Physical Exam  Constitutional: She appears well-developed and well-nourished. No distress.  Nontoxic appearing.  HENT:  Head: Normocephalic.  Eyes: Conjunctivae are normal.  Neck: Neck supple.  Cardiovascular: Normal rate and regular rhythm.   Pulmonary/Chest: Effort normal.  Abdominal: Soft.  Musculoskeletal: Normal range of motion.  Neurological: She is alert.  Skin: Skin is warm and dry. Capillary refill takes less than 2 seconds.  (see photo)  Redness and pain to the lateral aspect of the base of the left nail bed. No gross abscess appreciated. No fluctuant area. No drainage. Patient has limited range of motion of PIP due to pain however there is no swelling or erythema of the joint. Cap refill normal. Radial pulses are 2+ bilaterally. Sensation intact sharp/dull.  Psychiatric: She has a normal mood and affect.  Nursing note and vitals reviewed.        ED Treatments / Results  DIAGNOSTIC STUDIES: Oxygen Saturation is 98% on RA, normal by my interpretation.    COORDINATION OF CARE: 5:06 PM- Pt advised of plan for treatment and pt agrees.  Labs (all labs ordered are listed, but only abnormal results are displayed) Labs Reviewed - No data to display  EKG  EKG Interpretation None       Radiology No results found.  Procedures .Marland KitchenIncision and Drainage Date/Time: 03/13/2016 6:36 PM Performed by: Ocie Cornfield T Authorized by: Ocie Cornfield T   Consent:    Consent obtained:  Verbal   Consent given by:  Patient   Risks discussed:  Bleeding, damage to other organs, incomplete drainage, infection and pain   Alternatives discussed:  No treatment Location:    Type:  Abscess   Location:  Upper extremity   Upper extremity location:  Finger   Finger location:  L thumb  (Paronychia) Pre-procedure details:    Skin preparation:  Betadine Anesthesia (see MAR for exact dosages):    Anesthesia method:  Nerve block   Block needle gauge:  25 G   Block anesthetic:  Lidocaine 1% w/o epi   Block injection procedure:  Anatomic landmarks identified, anatomic landmarks palpated, negative aspiration for blood, introduced needle and incremental injection   Block outcome:  Anesthesia achieved Procedure type:    Complexity:  Simple Procedure details:    Incision types:  Stab incision   Scalpel blade:  11   Wound management:  Irrigated with saline   Drainage:  Bloody and purulent   Drainage amount:  Moderate   Wound treatment:  Wound left open   Packing materials:  None Post-procedure details:    Patient tolerance of procedure:  Tolerated well, no immediate complications   (  including critical care time)  Medications Ordered in ED Medications  bacitracin ointment (not administered)  acetaminophen (TYLENOL) tablet 650 mg (650 mg Oral Given 03/13/16 1724)  lidocaine (PF) (XYLOCAINE) 1 % injection 5 mL (5 mLs Infiltration Given by Other 03/13/16 1726)  lidocaine-EPINEPHrine-tetracaine (LET) solution (3 mLs Topical Given 03/13/16 1725)     Initial Impression / Assessment and Plan / ED Course  I have reviewed the triage vital signs and the nursing notes.  Pertinent labs & imaging results that were available during my care of the patient were reviewed by me and considered in my medical decision making (see chart for details).     Patient presents to the ED with redness and pain to the base of the left cuticle of the thumb. Mild erythema and tenderness noted. Patient does have flexion and extension of the PIP. She is nontoxic-appearing. She is afebrile. Doubt septic joint. X-ray shows no focal bony abnormality. I&D was performed given likely of paronychia. Moderate amount of purulent discharge was expressed. Patient with significant relief in her symptoms after I&D. No  tenderness over the pad of the thumb witout any fluctuance. Doubt Felon. Patient has improved range of motion of her thumb. Will place on antibiotics and encourage soaks. Follow-up in 2-3 days for wound recheck or sooner if symptoms worsen. Have given referral to hand surgeon. Pt is hemodynamically stable, in NAD, & able to ambulate in the ED. Pain has been managed & has no complaints prior to dc. Pt is comfortable with above plan and is stable for discharge at this time. All questions were answered prior to disposition. Strict return precautions for f/u to the ED were discussed. Dicussed with Dr. Sherry Ruffing who is agreeable to the above plan.    Final Clinical Impressions(s) / ED Diagnoses   Final diagnoses:  Paronychia of finger, left    New Prescriptions New Prescriptions   CEPHALEXIN (KEFLEX) 500 MG CAPSULE    Take 1 capsule (500 mg total) by mouth 4 (four) times daily.   HYDROCODONE-ACETAMINOPHEN (NORCO/VICODIN) 5-325 MG TABLET    Take 2 tablets by mouth every 4 (four) hours as needed.   I personally performed the services described in this documentation, which was scribed in my presence. The recorded information has been reviewed and is accurate.     Doristine Devoid, PA-C 03/13/16 1837    Gwenyth Allegra Tegeler, MD 03/14/16 [phone]

## 2016-03-13 NOTE — ED Notes (Signed)
Patient ambulatory to XR

## 2016-03-14 ENCOUNTER — Other Ambulatory Visit: Payer: Self-pay | Admitting: Cardiovascular Disease

## 2016-03-14 ENCOUNTER — Ambulatory Visit (INDEPENDENT_AMBULATORY_CARE_PROVIDER_SITE_OTHER): Payer: Self-pay | Admitting: Cardiovascular Disease

## 2016-03-14 ENCOUNTER — Encounter: Payer: Self-pay | Admitting: Cardiovascular Disease

## 2016-03-14 VITALS — BP 120/70 | HR 75 | Ht 66.0 in | Wt 253.8 lb

## 2016-03-14 DIAGNOSIS — R079 Chest pain, unspecified: Secondary | ICD-10-CM

## 2016-03-14 DIAGNOSIS — Z01812 Encounter for preprocedural laboratory examination: Secondary | ICD-10-CM

## 2016-03-14 DIAGNOSIS — R002 Palpitations: Secondary | ICD-10-CM

## 2016-03-14 DIAGNOSIS — R0789 Other chest pain: Secondary | ICD-10-CM

## 2016-03-14 DIAGNOSIS — I10 Essential (primary) hypertension: Secondary | ICD-10-CM

## 2016-03-14 LAB — CBC WITH DIFFERENTIAL/PLATELET
BASOS ABS: 0 10*3/uL (ref 0.0–0.2)
Basos: 0 %
EOS (ABSOLUTE): 0.1 10*3/uL (ref 0.0–0.4)
EOS: 2 %
HEMATOCRIT: 32.3 % — AB (ref 34.0–46.6)
Hemoglobin: 10.3 g/dL — ABNORMAL LOW (ref 11.1–15.9)
Immature Grans (Abs): 0 10*3/uL (ref 0.0–0.1)
Immature Granulocytes: 0 %
LYMPHS ABS: 2.4 10*3/uL (ref 0.7–3.1)
Lymphs: 39 %
MCH: 23.5 pg — ABNORMAL LOW (ref 26.6–33.0)
MCHC: 31.9 g/dL (ref 31.5–35.7)
MCV: 74 fL — ABNORMAL LOW (ref 79–97)
MONOCYTES: 7 %
MONOS ABS: 0.4 10*3/uL (ref 0.1–0.9)
NEUTROS PCT: 52 %
Neutrophils Absolute: 3.2 10*3/uL (ref 1.4–7.0)
PLATELETS: 275 10*3/uL (ref 150–379)
RBC: 4.39 x10E6/uL (ref 3.77–5.28)
RDW: 13.9 % (ref 12.3–15.4)
WBC: 6.1 10*3/uL (ref 3.4–10.8)

## 2016-03-14 LAB — PROTIME-INR
INR: 1 (ref 0.8–1.2)
Prothrombin Time: 10.2 s (ref 9.1–12.0)

## 2016-03-14 LAB — BASIC METABOLIC PANEL
BUN/Creatinine Ratio: 19 (ref 9–23)
BUN: 15 mg/dL (ref 6–24)
CALCIUM: 9.3 mg/dL (ref 8.7–10.2)
CHLORIDE: 100 mmol/L (ref 96–106)
CO2: 22 mmol/L (ref 18–29)
Creatinine, Ser: 0.77 mg/dL (ref 0.57–1.00)
GFR, EST AFRICAN AMERICAN: 101 mL/min/{1.73_m2} (ref >59)
GFR, EST NON AFRICAN AMERICAN: 87 mL/min/{1.73_m2} (ref >59)
Glucose: 103 mg/dL — ABNORMAL HIGH (ref 65–99)
Potassium: 4.4 mmol/L (ref 3.5–5.2)
Sodium: 139 mmol/L (ref 134–144)

## 2016-03-14 NOTE — Patient Instructions (Signed)
Medication Instructions:  Your physician recommends that you continue on your current medications as directed. Please refer to the Current Medication list given to you today.  Labwork: Your physician recommends that you have lab work today- BMET, CBC, PT/INR  Testing/Procedures: Your physician has requested that you have a cardiac catheterization. Cardiac catheterization is used to diagnose and/or treat various heart conditions. Doctors may recommend this procedure for a number of different reasons. The most common reason is to evaluate chest pain. Chest pain can be a symptom of coronary artery disease (CAD), and cardiac catheterization can show whether plaque is narrowing or blocking your heart's arteries. This procedure is also used to evaluate the valves, as well as measure the blood flow and oxygen levels in different parts of your heart. For further information please visit [email or web address]. Please follow instruction sheet, as given.  Follow-Up: Your physician wants you to follow-up after heart catheterization.  If you need a refill on your cardiac medications before your next appointment, please call your pharmacy.

## 2016-03-20 ENCOUNTER — Encounter (HOSPITAL_COMMUNITY): Payer: Self-pay | Admitting: Cardiology

## 2016-03-20 ENCOUNTER — Ambulatory Visit (HOSPITAL_COMMUNITY)
Admission: RE | Admit: 2016-03-20 | Discharge: 2016-03-20 | Disposition: A | Discharge status: Home / Self Care | Payer: Self-pay | Source: Ambulatory Visit | Attending: Cardiology | Admitting: Cardiology

## 2016-03-20 ENCOUNTER — Encounter (HOSPITAL_COMMUNITY)
Admission: RE | Disposition: A | Discharge status: Home / Self Care | Payer: Self-pay | Source: Ambulatory Visit | Attending: Cardiology

## 2016-03-20 DIAGNOSIS — E785 Hyperlipidemia, unspecified: Secondary | ICD-10-CM | POA: Insufficient documentation

## 2016-03-20 DIAGNOSIS — Z8249 Family history of ischemic heart disease and other diseases of the circulatory system: Secondary | ICD-10-CM | POA: Insufficient documentation

## 2016-03-20 DIAGNOSIS — Z7982 Long term (current) use of aspirin: Secondary | ICD-10-CM | POA: Insufficient documentation

## 2016-03-20 DIAGNOSIS — M1990 Unspecified osteoarthritis, unspecified site: Secondary | ICD-10-CM | POA: Insufficient documentation

## 2016-03-20 DIAGNOSIS — R0789 Other chest pain: Secondary | ICD-10-CM | POA: Diagnosis present

## 2016-03-20 DIAGNOSIS — E7800 Pure hypercholesterolemia, unspecified: Secondary | ICD-10-CM | POA: Diagnosis present

## 2016-03-20 DIAGNOSIS — Z833 Family history of diabetes mellitus: Secondary | ICD-10-CM | POA: Insufficient documentation

## 2016-03-20 DIAGNOSIS — M5416 Radiculopathy, lumbar region: Secondary | ICD-10-CM | POA: Insufficient documentation

## 2016-03-20 DIAGNOSIS — R002 Palpitations: Secondary | ICD-10-CM | POA: Insufficient documentation

## 2016-03-20 DIAGNOSIS — I10 Essential (primary) hypertension: Secondary | ICD-10-CM | POA: Diagnosis present

## 2016-03-20 HISTORY — PX: LEFT HEART CATH AND CORONARY ANGIOGRAPHY: CATH118249

## 2016-03-20 SURGERY — LEFT HEART CATH AND CORONARY ANGIOGRAPHY
Anesthesia: LOCAL

## 2016-03-20 MED ORDER — MUPIROCIN 2 % EX OINT
TOPICAL_OINTMENT | CUTANEOUS | Status: AC
  Filled 2016-03-20: qty 22

## 2016-03-20 MED ORDER — SODIUM CHLORIDE 0.9% FLUSH: 3.0000 mL | INTRAVENOUS | Status: DC | PRN

## 2016-03-20 MED ORDER — FENTANYL CITRATE (PF) 100 MCG/2ML IJ SOLN
INTRAMUSCULAR | Status: AC
  Filled 2016-03-20: qty 2

## 2016-03-20 MED ORDER — SODIUM CHLORIDE 0.9% FLUSH: 3.0000 mL | Freq: Two times a day (BID) | INTRAVENOUS | Status: DC

## 2016-03-20 MED ORDER — SODIUM CHLORIDE 0.9 % WEIGHT BASED INFUSION: 1.0000 mL/kg/h | INTRAVENOUS | Status: DC

## 2016-03-20 MED ORDER — SODIUM CHLORIDE 0.9 % WEIGHT BASED INFUSION
3.0000 mL/kg/h | INTRAVENOUS | Status: DC
  Administered 2016-03-20: 3 mL/kg/h via INTRAVENOUS

## 2016-03-20 MED ORDER — MIDAZOLAM HCL 2 MG/2ML IJ SOLN
INTRAMUSCULAR | Status: AC
  Filled 2016-03-20: qty 2

## 2016-03-20 MED ORDER — SODIUM CHLORIDE 0.9 % IV SOLN: 250.0000 mL | INTRAVENOUS | Status: DC | PRN

## 2016-03-20 MED ORDER — MIDAZOLAM HCL 2 MG/2ML IJ SOLN
INTRAMUSCULAR | Status: DC | PRN
  Administered 2016-03-20: 2 mg via INTRAVENOUS

## 2016-03-20 MED ORDER — IOPAMIDOL (ISOVUE-370) INJECTION 76%
INTRAVENOUS | Status: DC | PRN
  Administered 2016-03-20: 70 mL via INTRA_ARTERIAL

## 2016-03-20 MED ORDER — HEPARIN SODIUM (PORCINE) 1000 UNIT/ML IJ SOLN
INTRAMUSCULAR | Status: AC
  Filled 2016-03-20: qty 1

## 2016-03-20 MED ORDER — VERAPAMIL HCL 2.5 MG/ML IV SOLN
INTRAVENOUS | Status: AC
  Filled 2016-03-20: qty 2

## 2016-03-20 MED ORDER — HEPARIN (PORCINE) IN NACL 2-0.9 UNIT/ML-% IJ SOLN
INTRAMUSCULAR | Status: DC | PRN
  Administered 2016-03-20: 1000 mL

## 2016-03-20 MED ORDER — ASPIRIN 81 MG PO CHEW: 81.0000 mg | CHEWABLE_TABLET | ORAL | Status: DC

## 2016-03-20 MED ORDER — IOPAMIDOL (ISOVUE-370) INJECTION 76%
INTRAVENOUS | Status: AC
  Filled 2016-03-20: qty 100

## 2016-03-20 MED ORDER — HEPARIN (PORCINE) IN NACL 2-0.9 UNIT/ML-% IJ SOLN
INTRAMUSCULAR | Status: DC | PRN
  Administered 2016-03-20: 10 mL via INTRA_ARTERIAL

## 2016-03-20 MED ORDER — HEPARIN (PORCINE) IN NACL 2-0.9 UNIT/ML-% IJ SOLN
INTRAMUSCULAR | Status: AC
  Filled 2016-03-20: qty 1000

## 2016-03-20 MED ORDER — SODIUM CHLORIDE 0.9 % WEIGHT BASED INFUSION: 1.0000 mL/kg/h | INTRAVENOUS | Status: AC

## 2016-03-20 MED ORDER — LIDOCAINE HCL (PF) 1 % IJ SOLN
INTRAMUSCULAR | Status: AC
  Filled 2016-03-20: qty 30

## 2016-03-20 MED ORDER — FENTANYL CITRATE (PF) 100 MCG/2ML IJ SOLN
INTRAMUSCULAR | Status: DC | PRN
  Administered 2016-03-20: 25 ug via INTRAVENOUS

## 2016-03-20 MED ORDER — HEPARIN SODIUM (PORCINE) 1000 UNIT/ML IJ SOLN
INTRAMUSCULAR | Status: DC | PRN
  Administered 2016-03-20: 5500 [IU] via INTRAVENOUS

## 2016-03-20 MED ORDER — LIDOCAINE HCL (PF) 1 % IJ SOLN
INTRAMUSCULAR | Status: DC | PRN
  Administered 2016-03-20: 2 mL

## 2016-03-20 SURGICAL SUPPLY — 16 items
CATH EXPO 5F FL3.5 (CATHETERS) ×2 IMPLANT
CATH EXPO 5FR ANG PIGTAIL 145 (CATHETERS) ×2 IMPLANT
CATH EXPO 5FR FR4 (CATHETERS) ×2 IMPLANT
CATH LAUNCHER 5F RADL (CATHETERS) ×1 IMPLANT
CATHETER LAUNCHER 5F RADL (CATHETERS) ×2
COVER PRB 48X5XTLSCP FOLD TPE (BAG) ×1 IMPLANT
COVER PROBE 5X48 (BAG) ×1
DEVICE RAD COMP TR BAND LRG (VASCULAR PRODUCTS) ×2 IMPLANT
GLIDESHEATH SLEND SS 6F .021 (SHEATH) ×2 IMPLANT
GUIDEWIRE INQWIRE 1.5J.035X260 (WIRE) ×1 IMPLANT
INQWIRE 1.5J .035X260CM (WIRE) ×2
KIT HEART LEFT (KITS) ×2 IMPLANT
PACK CARDIAC CATHETERIZATION (CUSTOM PROCEDURE TRAY) ×2 IMPLANT
SYR MEDRAD MARK V 150ML (SYRINGE) ×2 IMPLANT
TRANSDUCER W/STOPCOCK (MISCELLANEOUS) ×2 IMPLANT
TUBING CIL FLEX 10 FLL-RA (TUBING) ×2 IMPLANT

## 2016-03-20 NOTE — Interval H&P Note (Signed)
History and Physical Interval Note:  03/20/2016 9:49 AM  Maria Sims  has presented today for surgery, with the diagnosis of cp  The various methods of treatment have been discussed with the patient and family. After consideration of risks, benefits and other options for treatment, the patient has consented to  Procedure(s): Left Heart Cath and Coronary Angiography (N/A) as a surgical intervention .  The patient's history has been reviewed, patient examined, no change in status, stable for surgery.  I have reviewed the patient's chart and labs.  Questions were answered to the patient's satisfaction.   Cath Lab Visit (complete for each Cath Lab visit)  Clinical Evaluation Leading to the Procedure:   ACS: No.  Non-ACS:    Anginal Classification: CCS III  Anti-ischemic medical therapy: No Therapy  Non-Invasive Test Results: Low-risk stress test findings: cardiac mortality <1%/year  Prior CABG: No previous CABG        [name] 03/20/2016 9:50 AM

## 2016-03-20 NOTE — H&P (View-Only) (Signed)
Cardiology Office Note    Date:  03/14/2016   ID:  Maria Sims, DOB 1960-04-26, MRN [id number]  PCP:  Florinda Marker, MD  Cardiologist:  Dr. Johnsie Cancel   Chief Complaint: Chest pain follow up  History of Present Illness:   Maria Sims is a 56 y.o. female  With PMH of HTN, HLD, OA, chronic lumbar radiculopathy and R breast cancer who presented for chest pain follow up.   Seen my me  11/26/15 for chest pain. Her chest pain was atypical. Started on trial of PPI and stress echo is normal.  Also advised to cut back on salt for intermittent LE Edema. Palpitations better with less caffeine   Here today for follow up. Still have squeezing SSCP on left. Has taken nitro with relief on at least 5 occasions Pain lasting minutes. Sometimes exertional but also at church. No associated dyspnea or syncope   Past Medical History:  Diagnosis Date  . Breast cancer ([place])   . Hypertension   . Tenosynovitis of foot and ankle 09/28/2013    Past Surgical History:  Procedure Laterality Date  . ABDOMINAL HYSTERECTOMY    . BREAST LUMPECTOMY    . BREAST RECONSTRUCTION    . BUNIONECTOMY Left 01/26/2014   @ [facility]  . CHOLECYSTECTOMY    . COTTON OSTEOTOMY W/ BONE GRAFT Left 01/26/2014   @ [facility]  . KNEE SURGERY    . NECK SURGERY      Current Medications: Prior to Admission medications   Medication Sig Start Date End Date Taking? Authorizing Provider  aspirin EC 81 MG tablet Take 1 tablet (81 mg total) by mouth daily. 11/22/15 11/21/16  Ledell Noss, MD  cyclobenzaprine (FLEXERIL) 5 MG tablet Take 1 tablet (5 mg total) by mouth at bedtime as needed for muscle spasms. 09/13/15   Bethany Molt, DO  FLUoxetine (PROZAC) 40 MG capsule Take 1 capsule (40 mg total) by mouth daily. 11/22/15   Ledell Noss, MD  gabapentin (NEURONTIN) 300 MG capsule Take 1 capsule (300 mg total) by mouth at bedtime. 11/22/15 11/21/16  Ledell Noss, MD  lisinopril-hydrochlorothiazide (PRINZIDE,ZESTORETIC) 20-12.5 MG tablet  Take 1 tablet by mouth daily. 11/22/15   Ledell Noss, MD  naproxen (NAPROSYN) 500 MG tablet Take 500 mg by mouth 2 (two) times daily with a meal.    Historical Provider, MD  nitroGLYCERIN (NITROSTAT) 0.4 MG SL tablet Place 1 tablet (0.4 mg total) under the tongue every 5 (five) minutes as needed for chest pain. 11/22/15 11/21/16  Ledell Noss, MD  pantoprazole (PROTONIX) 40 MG tablet Take 1 tablet (40 mg total) by mouth daily. 11/26/15   Bhavinkumar Bhagat, PA  pravastatin (PRAVACHOL) 40 MG tablet Take 1 tablet (40 mg total) by mouth daily. 11/22/15   Ledell Noss, MD  zolpidem (AMBIEN) 5 MG tablet Take 1 tablet (5 mg total) by mouth at bedtime as needed for sleep. 10/11/14   Florinda Marker, MD    Allergies:   Pineapple   Social History   Social History  . Marital status: Single    Spouse name: N/A  . Number of children: N/A  . Years of education: N/A   Social History Main Topics  . Smoking status: Never Smoker  . Smokeless tobacco: Never Used  . Alcohol use 0.0 oz/week     Comment: Socially.  . Drug use: No  . Sexual activity: Not Asked   Other Topics Concern  . None   Social History Narrative  . None  Family History:  The patient's family history includes Diabetes in her father; Hypertension in her mother.   ROS:   Please see the history of present illness.    ROS All other systems reviewed and are negative.   PHYSICAL EXAM:    VS:  BP 120/70   Pulse 75   Ht 5\' 6"  (1.676 m)   Wt 253 lb 12.8 oz (115.1 kg)   SpO2 96%   BMI 40.96 kg/m    GEN: Well nourished, well developed, in no acute distress  HEENT: normal  Neck: no JVD, carotid bruits, or masses Cardiac:RRR; no murmurs, rubs, or gallops,no edema  Respiratory:  clear to auscultation bilaterally, normal work of breathing GI: soft, nontender, nondistended, + BS MS: no deformity or atrophy  Skin: warm and dry, no rash Neuro:  Alert and Oriented x 3, Strength and sensation are intact Psych: euthymic mood, full  affect  Wt Readings from Last 3 Encounters:  03/14/16 253 lb 12.8 oz (115.1 kg)  03/13/16 257 lb (116.6 kg)  03/05/16 257 lb 12.8 oz (116.9 kg)      Studies/Labs Reviewed:   EKG:  EKG  SR rate 71 normal    Recent Labs: 06/06/2015: Platelets 306 03/05/2016: BUN 11; Creatinine, Ser 0.80; Potassium 4.2; Sodium 139   Lipid Panel    Component Value Date/Time   CHOL 283 (H) 10/10/2014 1126   TRIG 161 (H) 10/10/2014 1126   HDL 62 10/10/2014 1126   CHOLHDL 4.6 (H) 10/10/2014 1126   LDLCALC 189 (H) 10/10/2014 1126    Additional studies/ records that were reviewed today include:   Stress Echocardiogram: 12/12/15  Study Conclusions  - Stress ECG conclusions: The stress ECG was negative for ischemia. - Staged echo: There was no echocardiographic evidence for   stress-induced ischemia.  Impressions:  - Stress echo with no chest pain, no ST changes and no stress   induced wall motion abnormalities.  Stress results:   Maximal heart rate during stress was 160 bpm (96% of maximal predicted heart rate). The maximal predicted heart rate was 166 bpm.The target heart rate was achieved. The heart rate response to stress was normal. There was a normal resting blood pressure with an appropriate response to stress. The rate-pressure product for the peak heart rate and blood pressure was 17542 mm Hg/min.  The patient experienced no chest pain during stress.  ------------------------------------------------------------------- Stress ECG:   Rare atrial ectopy.  The stress ECG was negative for ischemia.  ------------------------------------------------------------------- Baseline:  - LV size was normal. - LV global systolic function was normal. The estimated LV ejection   fraction was 60%. - Normal wall motion; no LV regional wall motion abnormalities.  Peak stress:  - LV size was reduced appropriately. - LV global systolic function was vigorous. The estimated LV    ejection fraction was 80%. - No evidence for new LV regional wall motion abnormalities.  ------------------------------------------------------------------- Stress echo results:     Left ventricular ejection fraction was normal at rest and with stress. There was no echocardiographic evidence for stress-induced ischemia.     ASSESSMENT & PLAN:    1. Chest pain - Stress echo is normal. Continued episodes of pain responsive to nitro discussed options with patient [place] cath. Risks including stroke, bleeding MI and need for emergency surgery discussed willing to proceed Have arranged with Dr [place] next week Lab called blood work today orders written. Will need versed/fentanyl For sedation   2. Palpitations - Improved with cut back on caffeine.  Advice complete cessation. If no improvement will get monitor in future.   3. HLD - managed by PCP suggest starting statin or consider calcium score to guide Rx  4. HTN - Stable and well controlled.  5. R leg pain - Consistent with chronic lumbar radiculopathy    Jenkins Rouge

## 2016-03-20 NOTE — Discharge Instructions (Signed)
Radial Site Care °Refer to this sheet in the next few weeks. These instructions provide you with information about caring for yourself after your procedure. Your health care provider may also give you more specific instructions. Your treatment has been planned according to current medical practices, but problems sometimes occur. Call your health care provider if you have any problems or questions after your procedure. °What can I expect after the procedure? °After your procedure, it is typical to have the following: °· Bruising at the radial site that usually fades within 1-2 weeks. °· Blood collecting in the tissue (hematoma) that may be painful to the touch. It should usually decrease in size and tenderness within 1-2 weeks. °Follow these instructions at home: °· Take medicines only as directed by your health care provider. °· You may shower 24-48 hours after the procedure or as directed by your health care provider. Remove the bandage (dressing) and gently wash the site with plain soap and water. Pat the area dry with a clean towel. Do not rub the site, because this may cause bleeding. °· Do not take baths, swim, or use a hot tub until your health care provider approves. °· Check your insertion site every day for redness, swelling, or drainage. °· Do not apply powder or lotion to the site. °· Do not flex or bend the affected arm for 24 hours or as directed by your health care provider. °· Do not push or pull heavy objects with the affected arm for 24 hours or as directed by your health care provider. °· Do not lift over 10 lb (4.5 kg) for 5 days after your procedure or as directed by your health care provider. °· Ask your health care provider when it is okay to: °¨ Return to work or school. °¨ Resume usual physical activities or sports. °¨ Resume sexual activity. °· Do not drive home if you are discharged the same day as the procedure. Have someone else drive you. °· You may drive 24 hours after the procedure  unless otherwise instructed by your health care provider. °· Do not operate machinery or power tools for 24 hours after the procedure. °· If your procedure was done as an outpatient procedure, which means that you went home the same day as your procedure, a responsible adult should be with you for the first 24 hours after you arrive home. °· Keep all follow-up visits as directed by your health care provider. This is important. °Contact a health care provider if: °· You have a fever. °· You have chills. °· You have increased bleeding from the radial site. Hold pressure on the site. °Get help right away if: °· You have unusual pain at the radial site. °· You have redness, warmth, or swelling at the radial site. °· You have drainage (other than a small amount of blood on the dressing) from the radial site. °· The radial site is bleeding, and the bleeding does not stop after 30 minutes of holding steady pressure on the site. °· Your arm or hand becomes pale, cool, tingly, or numb. °This information is not intended to replace advice given to you by your health care provider. Make sure you discuss any questions you have with your health care provider. °Document Released: 02/01/2010 Document Revised: 06/07/2015 Document Reviewed: 07/18/2013 °Elsevier Interactive Patient Education © 2017 [employer]. ° °

## 2016-03-31 ENCOUNTER — Other Ambulatory Visit (INDEPENDENT_AMBULATORY_CARE_PROVIDER_SITE_OTHER): Payer: Self-pay

## 2016-03-31 DIAGNOSIS — Z1211 Encounter for screening for malignant neoplasm of colon: Secondary | ICD-10-CM

## 2016-03-31 LAB — POC HEMOCCULT BLD/STL (HOME/3-CARD/SCREEN)
FECAL OCCULT BLD: NEGATIVE
FECAL OCCULT BLD: NEGATIVE
Fecal Occult Blood, POC: NEGATIVE

## 2016-04-16 ENCOUNTER — Telehealth: Payer: Self-pay | Admitting: Internal Medicine

## 2016-04-16 NOTE — Telephone Encounter (Signed)
Calling to confirm appt for 04/17/16 at 9:15 lmtcb

## 2016-04-17 ENCOUNTER — Encounter: Payer: Self-pay | Admitting: Internal Medicine

## 2016-04-17 ENCOUNTER — Encounter (INDEPENDENT_AMBULATORY_CARE_PROVIDER_SITE_OTHER): Payer: Self-pay

## 2016-04-17 ENCOUNTER — Ambulatory Visit (INDEPENDENT_AMBULATORY_CARE_PROVIDER_SITE_OTHER): Payer: Self-pay | Admitting: Internal Medicine

## 2016-04-17 VITALS — BP 126/73 | HR 65 | Temp 98.1°F | Ht 66.0 in | Wt 258.2 lb

## 2016-04-17 DIAGNOSIS — R058 Other specified cough: Secondary | ICD-10-CM | POA: Insufficient documentation

## 2016-04-17 DIAGNOSIS — G8929 Other chronic pain: Secondary | ICD-10-CM

## 2016-04-17 DIAGNOSIS — R05 Cough: Secondary | ICD-10-CM

## 2016-04-17 DIAGNOSIS — E669 Obesity, unspecified: Secondary | ICD-10-CM

## 2016-04-17 DIAGNOSIS — M5416 Radiculopathy, lumbar region: Secondary | ICD-10-CM

## 2016-04-17 MED ORDER — GABAPENTIN 300 MG PO CAPS: 600.0000 mg | ORAL_CAPSULE | Freq: Every day | ORAL | 2 refills | Status: DC

## 2016-04-17 MED ORDER — LOSARTAN POTASSIUM-HCTZ 50-12.5 MG PO TABS: 1.0000 | ORAL_TABLET | Freq: Every day | ORAL | 2 refills | Status: DC

## 2016-04-17 MED FILL — GABAPENTIN 300 MG CAPSULE: 300 | 30 days supply | Qty: 60 | Fill #0

## 2016-04-17 MED FILL — LOSARTAN-HCTZ 50-12.5 MG TA: 50-12.5 | 30 days supply | Qty: 30 | Fill #0

## 2016-04-17 NOTE — Assessment & Plan Note (Addendum)
Her pain does not seem well-controlled as she is describing it as a 9 out of 10 in severity. Unfortunately, the gabapentin causes her to be too sleepy during the day, but does control her pain at night. Will increase her bedtime dose to 600 mg. Will also refer her for physical therapy and advised her to pay attention to the exercises that she learns so that she can perform them at home. Recommended to continue naproxen 500 mg twice daily. Can consider Cymbalta in the future if her pain is still not controlled. Will hold off on imaging her back at this time. She may need imaging if she expresses any changes in her pain, but overall her pain is stable. She reports she is working hard to lose weight and I encouraged her to continue with her lifestyle modifications.

## 2016-04-17 NOTE — Patient Instructions (Addendum)
General Instructions: - Ok to increase Gabapentin to 600 mg at bedtime. Sent in new prescription. - Changed Lisinopril-HCTZ to Losartan-HCTZ for blood pressure. Hopefully this will help your cough. - Referral made to physical therapy  Please bring your medicines with you each time you come to clinic.  Medicines may include prescription medications, over-the-counter medications, herbal remedies, eye drops, vitamins, or other pills.   Progress Toward Treatment Goals:  No flowsheet data found.  Self Care Goals & Plans:  Self Care Goal 03/05/2016  Manage my medications take my medicines as prescribed; bring my medications to every visit; refill my medications on time  Monitor my health keep track of my blood pressure  Eat healthy foods eat more vegetables; eat foods that are low in salt; eat baked foods instead of fried foods  Be physically active find an activity I enjoy  Prevent falls have my vision checked; wear appropriate shoes  Meeting treatment goals -    No flowsheet data found.   Care Management & Community Referrals:  No flowsheet data found.

## 2016-04-17 NOTE — Assessment & Plan Note (Signed)
Reports a 2 month history of dry cough with no associated symptoms. Her cough could be due to her lisinopril. She has agreed to switch her lisinopril to losartan. Prescribed her the combination pill of losartan-HCTZ 50-12 0.5 mg daily. Will evaluate at her next visit if this has helped her cough.

## 2016-04-17 NOTE — Progress Notes (Signed)
   CC: Low back pain  HPI:  [name] is a 56 y.o. woman with past medical history as noted below who presents today for evaluation of her lower back pain.  Chronic lumbar radiculopathy: Patient reports she's had low back pain with radiation to both of her legs for the last 8-9 months. She states that the quality and severity of pain have not changed. She describes that the pain in her right leg is much worse than the left. She describes the pain as achy, throbbing, and sharp. Her current level of pain is 9 out of 10 in severity. She denies any trauma to her back or legs. She has been taking double dose of gabapentin (600 mg) at bedtime which relieves her pain and allows her to sleep. She also notes an ice pack helps her pain. She cannot take gabapentin or any other medications, such as Tylenol or ibuprofen, as these make her sleepy during the day. She also takes naproxen 500 mg twice a day on most days which relieves her pain. She has never tried physical therapy. She reports the pain is preventing her from being able to drive for more than 10 minutes without having to get out of the car. She denies any saddle anesthesia. She notes a few episodes of urinary incontinence over the last few months but none recently.  Dry cough: Patient reports noticing a dry cough for the last 2 months. She denies any associated shortness of breath or mucus production. She notes her cough bothers her most at night when she is trying to sleep. She denies any fevers, chills, sinus congestion, or sore throat.  Past Medical History:  Diagnosis Date  . Breast cancer ([place])   . Hypertension   . Tenosynovitis of foot and ankle 09/28/2013    Review of Systems:   General: Denies night sweats, changes in weight, changes in appetite HEENT: Denies headaches, ear pain, changes in vision CV: Denies CP, palpitations, orthopnea Pulm: Denies wheezing GI: Denies abdominal pain, nausea, vomiting, diarrhea, constipation,  melena, hematochezia GU: Denies dysuria, hematuria, frequency Msk: See HPI Neuro: See HPI Skin: Denies rashes, bruising Psych: Denies depression, anxiety, hallucinations  Physical Exam:  Vitals:   04/17/16 0920  BP: 126/73  Pulse: 65  Temp: 98.1 F (36.7 C)  TempSrc: Oral  SpO2: 100%  Weight: 258 lb 3.2 oz (117.1 kg)  Height: 5\' 6"  (1.676 m)   General: Obese woman sitting up, NAD Back: Mild tenderness to palpation of spinous processes in lower back Ext: warm, no peripheral edema. She is only able to lift both of her legs about 30 degrees off the table due to pain. Pain is elicited with active flexion around 30 degrees as well.  Neuro: alert and oriented x 3. Strength 5/5 in lower extremities bilaterally. Gait normal.   Assessment & Plan:   See Encounters Tab for problem based charting.  Patient discussed with Dr. Eppie Gibson

## 2016-04-21 NOTE — Progress Notes (Signed)
Case discussed with Dr. Rivet soon after the resident saw the patient. We reviewed the resident's history and exam and pertinent patient test results. I agree with the assessment, diagnosis, and plan of care documented in the resident's note. 

## 2016-04-29 ENCOUNTER — Ambulatory Visit: Payer: Self-pay | Attending: Internal Medicine | Admitting: Physical Therapy

## 2016-04-29 DIAGNOSIS — M545 Low back pain: Secondary | ICD-10-CM | POA: Insufficient documentation

## 2016-04-29 DIAGNOSIS — R293 Abnormal posture: Secondary | ICD-10-CM | POA: Insufficient documentation

## 2016-04-29 DIAGNOSIS — G8929 Other chronic pain: Secondary | ICD-10-CM | POA: Insufficient documentation

## 2016-04-29 NOTE — Patient Instructions (Signed)
Hamstring Step 2    Left foot relaxed, knee straight, other leg bent, foot flat. Raise straight leg further upward to maximal range. Hold _30__ seconds. Relax leg completely down. Repeat _3__ times. **GENTLE**   Low Back Stretch: One leg (Supine)    Lying on back, bring one knee toward chest by pulling gently behind knee. Hold __10__ seconds. Repeat with other leg. Repeat 10 times.  **have other leg bent if more comfortable**    Lumbar Rotation: Caudal - Bilateral (Supine)    Feet and knees together, arms outstretched, rotate knees left, turning head in opposite direction, until stretch is felt. Hold __10__ seconds. Relax. Repeat __10-15__ times per set. Do __2__ sets per session.  **both knees bent, rocking side to side**    Pelvic Tilt: Posterior - Legs Bent (Supine)    Tighten stomach and flatten back by rolling pelvis down. Hold __5-10__ seconds. Relax. Repeat __15__ times per set. Do ___2_ sets per session.  **lying, sitting, walking**    Bridge    Lie back, legs bent. Inhale, pressing hips up. Keeping ribs in, lengthen lower back. Exhale, rolling down along spine from top. Repeat __15__ times. Do __2__ sessions per day.    Sleeping on Back   Place pillow under knees. A pillow with cervical support and a roll around waist are also helpful. Copyright  VHI. All rights reserved.  Sleeping on Side Place pillow between knees. Use cervical support under neck and a roll around waist as needed. Copyright  VHI. All rights reserved.   Sleeping on Stomach   If this is the only desirable sleeping position, place pillow under lower legs, and under stomach or chest as needed.  Posture - Sitting   Sit upright, head facing forward. Try using a roll to support lower back. Keep shoulders relaxed, and avoid rounded back. Keep hips level with knees. Avoid crossing legs for long periods. Stand to Sit / Sit to Stand   To sit: Bend knees to lower self onto front edge  of chair, then scoot back on seat. To stand: Reverse sequence by placing one foot forward, and scoot to front of seat. Use rocking motion to stand up.   Work Height and Reach  Ideal work height is no more than 2 to 4 inches below elbow level when standing, and at elbow level when sitting. Reaching should be limited to arm's length, with elbows slightly bent.  Bending  Bend at hips and knees, not back. Keep feet shoulder-width apart.    Posture - Standing   Good posture is important. Avoid slouching and forward head thrust. Maintain curve in low back and align ears over shoul- ders, hips over ankles.  Alternating Positions   Alternate tasks and change positions frequently to reduce fatigue and muscle tension. Take rest breaks. Computer Work   Position work to Programmer, multimedia. Use proper work and seat height. Keep shoulders back and down, wrists straight, and elbows at right angles. Use chair that provides full back support. Add footrest and lumbar roll as needed.  Getting Into / Out of Car  Lower self onto seat, scoot back, then bring in one leg at a time. Reverse sequence to get out.  Dressing  Lie on back to pull socks or slacks over feet, or sit and bend leg while keeping back straight.    Housework - Sink  Place one foot on ledge of cabinet under sink when standing at sink for prolonged periods.   Pushing / Pulling  Pushing  is preferable to pulling. Keep back in proper alignment, and use leg muscles to do the work.  Deep Squat   Squat and lift with both arms held against upper trunk. Tighten stomach muscles without holding breath. Use smooth movements to avoid jerking.  Avoid Twisting   Avoid twisting or bending back. Pivot around using foot movements, and bend at knees if needed when reaching for articles.  Carrying Luggage   Distribute weight evenly on both sides. Use a cart whenever possible. Do not twist trunk. Move body as a unit.   Lifting  Principles .Maintain proper posture and head alignment. .Slide object as close as possible before lifting. .Move obstacles out of the way. .Test before lifting; ask for help if too heavy. .Tighten stomach muscles without holding breath. .Use smooth movements; do not jerk. .Use legs to do the work, and pivot with feet. .Distribute the work load symmetrically and close to the center of trunk. .Push instead of pull whenever possible.   Ask For Help   Ask for help and delegate to others when possible. Coordinate your movements when lifting together, and maintain the low back curve.  Log Roll   Lying on back, bend left knee and place left arm across chest. Roll all in one movement to the right. Reverse to roll to the left. Always move as one unit. Housework - Sweeping  Use long-handled equipment to avoid stooping.   Housework - Wiping  Position yourself as close as possible to reach work surface. Avoid straining your back.  Laundry - Unloading Wash   To unload small items at bottom of washer, lift leg opposite to arm being used to reach.  [place] close to area to be raked. Use arm movements to do the work. Keep back straight and avoid twisting.   Cart  When reaching into cart with one arm, lift opposite leg to keep back straight.   Getting Into / Out of Bed  Lower self to lie down on one side by raising legs and lowering head at the same time. Use arms to assist moving without twisting. Bend both knees to roll onto back if desired. To sit up, start from lying on side, and use same move-ments in reverse. Housework - Vacuuming  Hold the vacuum with arm held at side. Step back and forth to move it, keeping head up. Avoid twisting.   Laundry - IT consultant so that bending and twisting can be avoided.   Laundry - Unloading Dryer  Squat down to reach into clothes dryer or use a reacher.  Gardening - Weeding / Probation officer or  Kneel. Knee pads may be helpful.

## 2016-04-30 NOTE — Therapy (Signed)
[facility] [address]  [place], [place], [address] Phone: [phone]   Fax:  [phone]  Physical Therapy Evaluation  Patient Details  Name: Maria Sims MRN: [id number] Date of Birth: Oct 02, 1960 Referring Provider: Dr. Martyn Malay  Encounter Date: 04/29/2016      PT End of Session - 04/30/16 0835    Visit Number 1   PT Start Time 5956   PT Stop Time 1540   PT Time Calculation (min) 48 min   Activity Tolerance Patient tolerated treatment well   Behavior During Therapy [facility] for tasks assessed/performed      Past Medical History:  Diagnosis Date  . Breast cancer ([place])   . Hypertension   . Tenosynovitis of foot and ankle 09/28/2013    Past Surgical History:  Procedure Laterality Date  . ABDOMINAL HYSTERECTOMY    . BREAST LUMPECTOMY    . BREAST RECONSTRUCTION    . BUNIONECTOMY Left 01/26/2014   @ [facility]  . CHOLECYSTECTOMY    . COTTON OSTEOTOMY W/ BONE GRAFT Left 01/26/2014   @ [place]  . KNEE SURGERY    . LEFT HEART CATH AND CORONARY ANGIOGRAPHY N/A 03/20/2016   Procedure: Left Heart Cath and Coronary Angiography;  Surgeon: Peter M Martinique, MD;  Location: [facility];  Service: Cardiovascular;  Laterality: N/A;  . NECK SURGERY      There were no vitals filed for this visit.       Subjective Assessment - 04/29/16 1453    Subjective Patient reporting low back pain - mid section. Does have some burning and tingling going into B LE - increased with increased activity/walking. Feels like her pain also gets worse when lying in bed at night. Reports low back pain has been present for many years - ongoing issue; Saw neurologist in [place] a while back - reports some nerve damage that may get worse as years go on. Difficulty with ADLs and light household chores. Reports some bouts of bowel incontinence - once every once in a while - not "back to back." Currently unemployed - due to back pain.     Pertinent History HTN, depression (medication)   Limitations Sitting;Standing;Walking   How long can you sit comfortably? constantly moving   How long can you stand comfortably? 10 minutes   How long can you walk comfortably? 20 minutes   Diagnostic tests none   Patient Stated Goals improve pain and function   Currently in Pain? Yes   Pain Score 6    Pain Location Back  and into legs   Pain Orientation Right;Left;Lower   Pain Descriptors / Indicators Aching;Constant;Discomfort   Pain Type Chronic pain   Pain Onset More than a month ago   Pain Frequency Constant   Aggravating Factors  prolonged positioning, walking prolonged distances   Pain Relieving Factors gabapentin (increased dosage)            [facility] PT Assessment - 04/29/16 1503      Assessment   Medical Diagnosis chronic lumbar radiculopathy   Referring Provider Dr. Martyn Malay   Next MD Visit 06/11/16   Prior Therapy yes - not for this issue     Precautions   Precautions None     Restrictions   Weight Bearing Restrictions No     Balance Screen   Has the patient fallen in the past 6 months Yes   How many times? 4   Has the patient had a  decrease in activity level because of a fear of falling?  No   Is the patient reluctant to leave their home because of a fear of falling?  No     Home Environment   Living Environment Private residence   Living Arrangements Children   Type of [place] Access Level entry   Home Layout One level  visit upstairs neighbor - difficulty with stairs   [place] - single point  out in community     Prior Function   Level of [place] On disability;Unemployed   Leisure spending time with family     Cognition   Overall Cognitive Status Within Functional Limits for tasks assessed     Sensation   Light Touch Appears Intact   Additional Comments intermittent N&T into B LE - hip down into feet     Coordination   Gross Motor  Movements are Fluid and Coordinated Yes     Posture/Postural Control   Posture/Postural Control Postural limitations   Postural Limitations Rounded Shoulders;Forward head     ROM / Strength   AROM / PROM / Strength AROM;Strength     AROM   AROM Assessment Site Lumbar   Lumbar Flexion fingertip to mid shin - pain and tightness   Lumbar Extension 50% limited - tightness   Lumbar - Right Side Bend fingertip to joint line - minimal pain   Lumbar - Left Side Bend fingertip to mid thigh - pain   Lumbar - Right Rotation 25% limited - increased pain as compared to L   Lumbar - Left Rotation WFL - some midline back pain     Strength   Overall Strength Comments B LE grossly 4/5     Palpation   SI assessment  some pain with gentle CPAs of lumbar spine and sacrum     Special Tests    Special Tests Lumbar   Lumbar Tests Straight Leg Raise     Straight Leg Raise   Findings --  not a true positive    Comment bilateral                   [facility] Adult PT Treatment/Exercise - 04/29/16 1503      Exercises   Exercises Lumbar     Lumbar Exercises: Stretches   Passive Hamstring Stretch 3 reps;30 seconds   Passive Hamstring Stretch Limitations bilateral - supine with strap   Single Knee to Chest Stretch 5 reps;10 seconds   Single Knee to Chest Stretch Limitations bilateral - with towel   Lower Trunk Rotation 5 reps;10 seconds     Lumbar Exercises: Supine   Ab Set 10 reps;5 seconds   Bridge 10 reps                PT Education - 04/30/16 0834    Education provided Yes   Education Details exam findings, POC, HEP   Person(s) Educated Patient   Methods Explanation;Demonstration;Handout   Comprehension Verbalized understanding;Returned demonstration             PT Long Term Goals - 04/30/16 6812      PT LONG TERM GOAL #1   Title patient to be independent with comprehenisive HEP   Status New     PT LONG TERM GOAL #2   Title Patient to verbalize and  demonstrate good posture and body mechanics    Status New     PT LONG TERM GOAL #3  Title Patient to verbalize PT options available to her (pro-bon clinic)   Status New               Plan - 04/30/16 5208    Clinical Impression Statement Patient is a 56 y/o female presenting to [place] for evaluation of primary complaints of low back pain with pain radiation into B LE. Patient today with reduced lumbar AROM with pain in all planes, tightness of B LE (both anterior and posterior), pain with palpation to lumbar and glute musculature, as well as pain with CPAs to lumbar spine and sacrum, and poor posturing. Patient with limited PT benefits allowing for only 1 eval and treatment to be covered. WIll plan to see patient 1x next week to establish comprehensive HEP and refer to pro bono clinic, as I feel patient would greatly benefit from continued PT services.    Rehab Potential Good   PT Frequency Other (comment)  Adult Medicaid - 1 eval, 1 treatment   PT Treatment/Interventions ADLs/Self Care Home Management;Cryotherapy;Electrical Stimulation;Moist Heat;Traction;Ultrasound;Neuromuscular re-education;Balance training;Therapeutic exercise;Therapeutic activities;Functional mobility training;Stair training;Gait training;Patient/family education;Manual techniques;Passive range of motion;Vasopneumatic Device;Taping;Dry needling   Consulted and Agree with Plan of Care Patient      Patient will benefit from skilled therapeutic intervention in order to improve the following deficits and impairments:  Decreased activity tolerance, Decreased range of motion, Decreased mobility, Decreased strength, Difficulty walking, Increased muscle spasms, Pain  Visit Diagnosis: Chronic low back pain, unspecified back pain laterality, with sciatica presence unspecified - Plan: PT plan of care cert/re-cert  Abnormal posture - Plan: PT plan of care cert/re-cert     Problem List Patient Active Problem List    Diagnosis Date Noted  . Dry cough 04/17/2016  . Atypical chest pain 11/22/2015  . Pes planus of both feet 09/17/2015  . Plantar fasciitis of right foot 09/13/2015  . Restless leg syndrome 06/06/2015  . Right carpal tunnel syndrome 06/01/2015  . Lumbar radiculopathy, chronic 03/28/2015  . Morton neuroma 02/08/2015  . Osteoarthritis of right knee 01/03/2015  . Myopia of both eyes 11/29/2014  . Hypercholesteremia 10/11/2014  . Essential hypertension 10/10/2014  . Healthcare maintenance 10/10/2014  . Prediabetes 10/10/2014  . Depression, major, recurrent, moderate ([place]) 06/08/2014  . Equinus deformity of foot, acquired 08/24/2013     Lanney Gins, PT, DPT 04/30/16 9:00 AM   [facility] [address]  [place], [place], [address] Phone: [phone]   Fax:  [phone]  Name: Brianda Beitler MRN: [id number] Date of Birth: Aug 14, 1960

## 2016-05-06 ENCOUNTER — Ambulatory Visit: Payer: Self-pay | Admitting: Physical Therapy

## 2016-05-12 ENCOUNTER — Ambulatory Visit: Payer: Self-pay | Admitting: Physical Therapy

## 2016-05-12 DIAGNOSIS — G8929 Other chronic pain: Secondary | ICD-10-CM

## 2016-05-12 DIAGNOSIS — R293 Abnormal posture: Secondary | ICD-10-CM

## 2016-05-12 DIAGNOSIS — M545 Low back pain: Principal | ICD-10-CM

## 2016-05-12 NOTE — Patient Instructions (Addendum)
Open book  Each side - 10 times hold 10-15 seconds   Abductor Strength: Bridge Pose (Strap)    Make strap wide enough to brace knees at hip width. Press into strap with knees. Hold for _5___ breaths. Repeat __15__ times.   Hip Abduction / Adduction: with Knee Flexion (Supine)     **Both knees bent and core engaged** **green band around knees**  gently lower knee to side and return. Repeat __15__ times per set. Do _2___ sets per session.    Pelvic Tilt: Posterior - Legs Bent (Supine)    Tighten stomach and flatten back by rolling pelvis down. Hold ___5_ seconds. Relax. Repeat _15___ times per set. Do __2__ sets per session.   **lying down, seated, standing**  1. Lower leg alternating marches 2. Lower leg alternating kick outs 3. Arms overhead 4. Dead bug   Bilateral Isometric Hip Flexion    Tighten stomach and raise both knees to outstretched arms. Push gently, keeping arms straight, trunk rigid. Hold __5-10__ seconds. Repeat _15___ times per set. Do _2_ sets per session.    Clam Shell 45 Degrees    Lying with hips and knees bent 45, one pillow between knees and ankles. Lift knee. Be sure pelvis does not roll backward. Do not arch back. Do __10-15_ times, each leg, _2__ times per day. **green band at knees**   Straight Leg Raise   Tighten stomach and slowly raise locked right leg __~8__ inches from floor. Repeat __15__ times per set. Do __2__ sets per session.

## 2016-05-12 NOTE — Therapy (Addendum)
[facility] [address]  [place], [place], [address] Phone: [phone]   Fax:  [phone]  Physical Therapy Treatment  Patient Details  Name: Maria Sims MRN: [id number] Date of Birth: Jun 15, 1960 Referring Provider: Dr. Martyn Malay  Encounter Date: 05/12/2016      PT End of Session - 05/12/16 0923    PT Start Time 0920   PT Stop Time 1017   PT Time Calculation (min) 57 min   Activity Tolerance Patient tolerated treatment well   Behavior During Therapy [facility] for tasks assessed/performed      Past Medical History:  Diagnosis Date  . Breast cancer ([place])   . Hypertension   . Tenosynovitis of foot and ankle 09/28/2013    Past Surgical History:  Procedure Laterality Date  . ABDOMINAL HYSTERECTOMY    . BREAST LUMPECTOMY    . BREAST RECONSTRUCTION    . BUNIONECTOMY Left 01/26/2014   @ [facility]  . CHOLECYSTECTOMY    . COTTON OSTEOTOMY W/ BONE GRAFT Left 01/26/2014   @ [place]  . KNEE SURGERY    . LEFT HEART CATH AND CORONARY ANGIOGRAPHY N/A 03/20/2016   Procedure: Left Heart Cath and Coronary Angiography;  Surgeon: Peter M Martinique, MD;  Location: [facility];  Service: Cardiovascular;  Laterality: N/A;  . NECK SURGERY      There were no vitals filed for this visit.      Subjective Assessment - 05/12/16 0917    Subjective Patient reporting she went for a walk - R knee buckled (without fall) and has had L ankle pain since - denies any rolling or twisting of ankle - did not seek medical attention. Reports occassional sharp pain up midline of back - no known pattern.    Pertinent History HTN, depression (medication)   Limitations Sitting;Standing;Walking   Patient Stated Goals improve pain and function   Currently in Pain? Yes   Pain Score 5    Pain Location Back   Pain Orientation Right;Left;Lower   Pain Descriptors / Indicators Aching   Pain Type Chronic pain   Pain Onset More than a  month ago   Pain Frequency Constant                         [facility] Adult PT Treatment/Exercise - 05/12/16 0929      Lumbar Exercises: Stretches   Passive Hamstring Stretch 1 rep;30 seconds   Passive Hamstring Stretch Limitations bilateral - supine with strap; HEP review   Single Knee to Chest Stretch 2 reps;10 seconds   Single Knee to Chest Stretch Limitations bilateral - with towel; HEP review     Lumbar Exercises: Supine   Ab Set 10 reps;5 seconds   AB Set Limitations 1 set ab set; 1 set LE marches; 1 set alternating kick outs, 1 set arms overhead; 1 set dead bug   Clam 10 reps;5 seconds   Clam Limitations green tband at [employer] 15 reps   Bridge Limitations 1 set with no resistance; 1 set with green tband at knees   Isometric Hip Flexion 10 reps;5 seconds     Lumbar Exercises: Sidelying   Clam 10 reps;5 seconds   Clam Limitations green tband at knees   Other Sidelying Lumbar Exercises open book - 10 reps x 5 sec each side  PT Long Term Goals - 05/12/16 0954      PT LONG TERM GOAL #1   Title patient to be independent with comprehenisive HEP   Status Achieved     PT LONG TERM GOAL #2   Title Patient to verbalize and demonstrate good posture and body mechanics    Status Achieved     PT LONG TERM GOAL #3   Title Patient to verbalize PT options available to her (pro-bon clinic)   Status Achieved               Plan - 05/12/16 0924    Clinical Impression [place] presenting to [facility] today for treatment follow-up since initial eval. Patient reporting her knee buckled when she was walking over the weekend - did not fall, but she has had ankle pain since. No evidence of antalgic gait pattern or pain limiting general mobility. Treatment today with heavy focus on core stabilization as well as general hip strengthening with good carryover will all tasks. [facility] pro bono handout given to patient today with patient  able to verbalize PT options available to her.    PT Treatment/Interventions ADLs/Self Care Home Management;Cryotherapy;Electrical Stimulation;Moist Heat;Traction;Ultrasound;Neuromuscular re-education;Balance training;Therapeutic exercise;Therapeutic activities;Functional mobility training;Stair training;Gait training;Patient/family education;Manual techniques;Passive range of motion;Vasopneumatic Device;Taping;Dry needling   Consulted and Agree with Plan of Care Patient      Patient will benefit from skilled therapeutic intervention in order to improve the following deficits and impairments:  Decreased activity tolerance, Decreased range of motion, Decreased mobility, Decreased strength, Difficulty walking, Increased muscle spasms, Pain  Visit Diagnosis: Chronic low back pain, unspecified back pain laterality, with sciatica presence unspecified  Abnormal posture     Problem List Patient Active Problem List   Diagnosis Date Noted  . Dry cough 04/17/2016  . Atypical chest pain 11/22/2015  . Pes planus of both feet 09/17/2015  . Plantar fasciitis of right foot 09/13/2015  . Restless leg syndrome 06/06/2015  . Right carpal tunnel syndrome 06/01/2015  . Lumbar radiculopathy, chronic 03/28/2015  . Morton neuroma 02/08/2015  . Osteoarthritis of right knee 01/03/2015  . Myopia of both eyes 11/29/2014  . Hypercholesteremia 10/11/2014  . Essential hypertension 10/10/2014  . Healthcare maintenance 10/10/2014  . Prediabetes 10/10/2014  . Depression, major, recurrent, moderate ([place]) 06/08/2014  . Equinus deformity of foot, acquired 08/24/2013     Lanney Gins, PT, DPT 05/12/16 12:55 PM  PHYSICAL THERAPY DISCHARGE SUMMARY    Current functional level related to goals / functional outcomes: See above   Remaining deficits: See above   Education / Equipment: HEP  Plan: Patient agrees to discharge.  Patient goals were met. Patient is being discharged due to meeting the stated  rehab goals.  ?????   Patient given information on [facility] pro bono clinic as patient would greatly benefit from continued PT services.   Lanney Gins, PT, DPT 06/30/16 8:13 AM   [facility] [address]  [place], [place], [address] Phone: [phone]   Fax:  [phone]  Name: Maria Sims MRN: [id number] Date of Birth: Jul 07, 1960

## 2016-05-15 ENCOUNTER — Telehealth: Payer: Self-pay | Admitting: Internal Medicine

## 2016-05-15 NOTE — Telephone Encounter (Signed)
APT. REMINDER CALL, NO ANSWER, NO VOICEMAIL °

## 2016-05-16 ENCOUNTER — Other Ambulatory Visit: Payer: Self-pay | Admitting: *Deleted

## 2016-05-16 ENCOUNTER — Ambulatory Visit (INDEPENDENT_AMBULATORY_CARE_PROVIDER_SITE_OTHER): Payer: Self-pay | Admitting: Internal Medicine

## 2016-05-16 ENCOUNTER — Encounter: Payer: Self-pay | Admitting: Internal Medicine

## 2016-05-16 VITALS — BP 125/73 | HR 83 | Temp 98.6°F | Ht 66.0 in | Wt 257.8 lb

## 2016-05-16 DIAGNOSIS — M25561 Pain in right knee: Principal | ICD-10-CM

## 2016-05-16 DIAGNOSIS — E669 Obesity, unspecified: Secondary | ICD-10-CM

## 2016-05-16 DIAGNOSIS — R7303 Prediabetes: Secondary | ICD-10-CM

## 2016-05-16 DIAGNOSIS — M170 Bilateral primary osteoarthritis of knee: Secondary | ICD-10-CM

## 2016-05-16 DIAGNOSIS — G8929 Other chronic pain: Secondary | ICD-10-CM

## 2016-05-16 DIAGNOSIS — F331 Major depressive disorder, recurrent, moderate: Secondary | ICD-10-CM

## 2016-05-16 DIAGNOSIS — M25562 Pain in left knee: Principal | ICD-10-CM

## 2016-05-16 MED ORDER — MELOXICAM 15 MG PO TABS: 15.0000 mg | ORAL_TABLET | Freq: Every day | ORAL | 2 refills | Status: DC

## 2016-05-16 MED ORDER — CYCLOBENZAPRINE HCL 5 MG PO TABS: 5.0000 mg | ORAL_TABLET | Freq: Every day | ORAL | 0 refills | Status: DC

## 2016-05-16 NOTE — Patient Instructions (Signed)
Stop naproxen  Start meloxicam daily.  Continue exercises, start water exercise.  Call your ortho office for re-evaluation.

## 2016-05-16 NOTE — Assessment & Plan Note (Signed)
Has acute on chronic b/l knee pain 2/2 to OA of b/l knees. Saw sports medicine in the past, knee injection did not help previously. Has 9/10 pain now which has worsened for last few weeks. Significant knee joint pain on exam.   We talked about surgical evaluation, already saw ortho in the past and they recommended weight loss before surgery. I asked her to call their office back for re-evaluation as she is having significant difficulties.  -change naproxen to meloxicam - cont gabapentin.  - refilled flexeril for back pain - recommended continuing home exercises and to start water aerobics. - may need referral for bariatric surgery if continues to have pain and surgical option is not offered due to obesity.

## 2016-05-16 NOTE — Progress Notes (Signed)
Medicine attending: Medical history, presenting problems, physical findings, and medications, reviewed with resident physician Dr Tasrif Ahmed on the day of the patient visit and I concur with his evaluation and management plan. 

## 2016-05-16 NOTE — Progress Notes (Signed)
   CC: knee/leg pain  HPI:  [name] is a 56 y.o. with PMH here with leg pain.   Past Medical History:  Diagnosis Date  . Breast cancer ([place])   . Hypertension   . Tenosynovitis of foot and ankle 09/28/2013   Seen on 04/17/16 with back pain radiating to both legs for 8-9 months. Gabapentin was helping (600mg  qHS) along with naproxen. Was referred to PT along with continuation of gabapentin 600mg  QHS (day time dose makes her sleepy) along with naproxen 500mg  bid. Back pain is better today. Doing exercises shown by PT at home.   However, today she came in with 9/10 b/l knee pain that's constant, worse with walking or any movement of the knee joint, has some radiation to the lower legs. Has hx of severe arthritis b/l knees, saw ortho in the past they said she has to lose weight before they will do surgery.    Review of Systems:   Review of Systems  Constitutional: Negative for chills and fever.  Cardiovascular: Negative for chest pain.  Genitourinary: Negative for dysuria.  Musculoskeletal: Positive for joint pain.  Neurological: Negative for dizziness and headaches.     Physical Exam:  Vitals:   05/16/16 0947  BP: 125/73  Pulse: 83  SpO2: 99%  Weight: 257 lb 12.8 oz (116.9 kg)   Physical Exam  Constitutional: She is oriented to person, place, and time. She appears well-developed and well-nourished. No distress.  Obese female  Cardiovascular: Normal rate and regular rhythm.  Exam reveals no gallop and no friction rub.   No murmur heard. Respiratory: Effort normal and breath sounds normal.  Musculoskeletal:  5/5 str b/l upper and lower exts. Knee joint ROM is severely limited due to pain on both sides. Has cracking of the knee joint, tenderness over the lateral, medial, and anterior aspect of the knee. Has some some swelling on both knee joints (unclear if extra fat tissue vs fluid). No erythema or warmth.   Neurological: She is alert and oriented to person, place,  and time.  Skin: She is not diaphoretic.    Assessment & Plan:   See Encounters Tab for problem based charting.  Patient discussed with Dr. Beryle Beams

## 2016-05-19 MED ORDER — PANTOPRAZOLE SODIUM 40 MG PO TBEC: 40.0000 mg | DELAYED_RELEASE_TABLET | Freq: Every day | ORAL | 11 refills | Status: DC

## 2016-06-11 ENCOUNTER — Encounter: Payer: Self-pay | Admitting: Internal Medicine

## 2016-06-11 ENCOUNTER — Ambulatory Visit (INDEPENDENT_AMBULATORY_CARE_PROVIDER_SITE_OTHER): Payer: Self-pay | Admitting: Internal Medicine

## 2016-06-11 VITALS — BP 130/74 | HR 72 | Temp 98.5°F | Ht 66.0 in | Wt 257.2 lb

## 2016-06-11 DIAGNOSIS — G8929 Other chronic pain: Secondary | ICD-10-CM

## 2016-06-11 DIAGNOSIS — E669 Obesity, unspecified: Secondary | ICD-10-CM

## 2016-06-11 DIAGNOSIS — F331 Major depressive disorder, recurrent, moderate: Secondary | ICD-10-CM

## 2016-06-11 DIAGNOSIS — R7303 Prediabetes: Secondary | ICD-10-CM

## 2016-06-11 DIAGNOSIS — H43392 Other vitreous opacities, left eye: Secondary | ICD-10-CM

## 2016-06-11 DIAGNOSIS — M722 Plantar fascial fibromatosis: Secondary | ICD-10-CM

## 2016-06-11 DIAGNOSIS — M5416 Radiculopathy, lumbar region: Secondary | ICD-10-CM

## 2016-06-11 DIAGNOSIS — E785 Hyperlipidemia, unspecified: Secondary | ICD-10-CM

## 2016-06-11 DIAGNOSIS — Z79899 Other long term (current) drug therapy: Secondary | ICD-10-CM

## 2016-06-11 DIAGNOSIS — Z0000 Encounter for general adult medical examination without abnormal findings: Secondary | ICD-10-CM

## 2016-06-11 DIAGNOSIS — G5601 Carpal tunnel syndrome, right upper limb: Secondary | ICD-10-CM

## 2016-06-11 DIAGNOSIS — I10 Essential (primary) hypertension: Secondary | ICD-10-CM

## 2016-06-11 LAB — GLUCOSE, CAPILLARY: GLUCOSE-CAPILLARY: 90 mg/dL (ref 65–99)

## 2016-06-11 LAB — POCT GLYCOSYLATED HEMOGLOBIN (HGB A1C): Hemoglobin A1C: 5.8

## 2016-06-11 MED ORDER — DULOXETINE HCL 30 MG PO CPEP: 30.0000 mg | ORAL_CAPSULE | Freq: Every day | ORAL | 3 refills | Status: DC

## 2016-06-11 MED ORDER — CYCLOBENZAPRINE HCL 5 MG PO TABS: 5.0000 mg | ORAL_TABLET | Freq: Every evening | ORAL | 2 refills | Status: DC | PRN

## 2016-06-11 MED ORDER — FLUOXETINE HCL 20 MG PO TABS: 20.0000 mg | ORAL_TABLET | Freq: Every day | ORAL | 0 refills | Status: DC

## 2016-06-11 MED ORDER — NAPROXEN 500 MG PO TABS: 500.0000 mg | ORAL_TABLET | Freq: Two times a day (BID) | ORAL | 5 refills | Status: DC

## 2016-06-11 MED FILL — FLUoxetine HCL 20 MG TABS: 20 | 7 days supply | Qty: 7 | Fill #0

## 2016-06-11 MED FILL — NAPROXEN 500 MG TABLET: 500 | 30 days supply | Qty: 60 | Fill #0

## 2016-06-11 MED FILL — CYCLOBENZAPRINE 5 MG TABLET: 5 | 30 days supply | Qty: 30 | Fill #0

## 2016-06-11 MED FILL — DULoxetine HCL 30 MG CPEP: 30 | 30 days supply | Qty: 30 | Fill #0

## 2016-06-11 NOTE — Patient Instructions (Signed)
Maria Sims,  For your chronic pain, we will start you on duloxetine. In order to do so, you must decrease her dose of fluoxetine to 20 mg a day for one week. Then stop taking fluoxetine. After you have been off of fluoxetine for 7 days, you can start duloxetine at 30 mg once a day. We will reassess you at this dose and increase it to 60 mg once a day if you still need additional pain control. In addition to duloxetine we will continue naproxen and Flexeril. Continue taking gabapentin at night. Continue physical therapy.  Continue taking all other medications as prescribed.  For your floaters, we are referring you to ophthalmology. If you notice a shade being pulled down over your eye, go to the emergency department.  For your plantar fasciitis, please follow-up with Dr. Amalia Hailey. His phone number is [phone] to schedule an appointment.  Please follow up in 3 months.

## 2016-06-11 NOTE — Progress Notes (Signed)
    CC: Follow-up for hypertension and chronic pain  HPI: [name] is a 56 y.o. female with PMHx of hypertension, chronic lumbar radiculopathy, right carpal tunnel syndrome, depression, prediabetes, hyperlipidemia who presents to the clinic for follow-up for prediabetes and hypertension.   Patient is eating and drinking well. She states that her chronic lumbar radiculopathy pain and plantar fasciitis pain is persistent and uncontrolled.  Please see problem based assessment and plan for more information of patient's chronic medical conditions.   Past Medical History:  Diagnosis Date  . Breast cancer ([place])   . Hypertension   . Tenosynovitis of foot and ankle 09/28/2013    Review of Systems: Please see pertinent ROS reviewed in HPI and problem based charting.   Physical Exam: Blood pressure 130/74, pulse 72, temperature 98.5 F (36.9 C), temperature source Oral, height 5\' 6"  (1.676 m), weight 257 lb 3.2 oz (116.7 kg), SpO2 99 %. General: Vital signs reviewed.  Patient is Obese, in no acute distress and cooperative with exam.  Cardiovascular: RRR,  no murmurs, gallops, or rubs. No JVD or carotid bruit present. No lower extremity edema bilaterally. Bilateral radial and pedal pulses are intact and symmetric bilaterally.  Pulmonary: Clear to auscultation bilaterally, no wheezes, rales, or rhonchi. No accessory muscle use. Gastrointestinal: Soft, non-tender, non-distended, BS + Musculoskeletal: Tenderness with palpation of bilateral feet along plantar surface.  Neurologic: Awake and alert. Answers all questions appropriately. Moves all extremities equally. Skin: Warm, dry and intact. No rashes or erythema. Psychiatric: Normal mood and affect. speech and behavior is normal.  Assessment & Plan:  See encounters tab for problem based medical decision making. Patient discussed with Dr. Angelia Mould

## 2016-06-12 DIAGNOSIS — H43392 Other vitreous opacities, left eye: Secondary | ICD-10-CM | POA: Insufficient documentation

## 2016-06-12 NOTE — Assessment & Plan Note (Signed)
Colon cancer screening: Stool cards negative in spring 2018, repeat in one year Cervical cancer screening: Pap smear normal in February 2018, repeat in February 2021 Breast cancer screening: Mammogram normal in January 2018, repeat in January 2019

## 2016-06-12 NOTE — Addendum Note (Signed)
Addended by: Martyn Malay R on: 06/12/2016 01:51 PM   Modules accepted: Orders

## 2016-06-12 NOTE — Assessment & Plan Note (Signed)
Patient has a history of plantar fasciitis for which she follows with podiatry. She was previously receiving steroid injections by Dr. Amalia Hailey. She is not received a steroid injection since December 2017. She states that recently this pain has flared up, especially in her right foot. She states that she continues to do her exercises, stretch, and use ice. She does admit that she has not been wearing proper foot wear as flip-flops feel more comfortable for her at this time. Patient has notable tenderness along the plantar surface of bilateral feet on palpation.  Assessment: Plantar fasciitis  Plan: -Continue stretching, ice, naproxen -Encourage proper foot wear -Provided with information to reschedule with Dr. Amalia Hailey for consideration of repeat glucocorticoid injection -Could consider foot braces at night to stretch tendon

## 2016-06-12 NOTE — Assessment & Plan Note (Signed)
Patient has several chronic pain complaints including chronic lumbar radiculopathy with radiation of pain down her left lower extremity, right wrist pain with a previous history of right carpal tunnel status post release in 2017, atypical chest pain, bilateral chronic knee pain, bilateral foot pain secondary to plantar fasciitis. Patient states that all of these issues continue to be uncontrolled from a pain standpoint. Her biggest complaints are her chronic lumbar radiculopathy and her plantar fasciitis. As far as her chronic lumbar radiculopathy, patient reports compliance with gabapentin 600 mg at night, naproxen 500 mg twice a day and Flexeril. Had a previous visit, patient was to be switched from naproxen to meloxicam, but patient never picked up this prescription as she feared to be too expensive and she states she has tried in the past and it did not work as well as naproxen. However, her pain continues to be uncontrolled with naproxen. We discussed starting Cymbalta which patient is agreeable to. Patient was most benefit from weight loss. In order to do so, patient may require bariatric surgery, but we are limited right now as patient does not have insurance.  Assessment: Chronic lumbar radiculopathy  Plan: -Continue naproxen 500 mg twice a day -Wean off of fluoxetine and start Cymbalta at 30 mg daily. Titrate up -Refill Flexeril -Continue physical therapy -Weight loss, consideration of bariatric surgery if patient is able to get insurance

## 2016-06-12 NOTE — Assessment & Plan Note (Signed)
BP Readings from Last 3 Encounters:  06/11/16 130/74  05/16/16 125/73  04/17/16 126/73    Lab Results  Component Value Date   NA 139 03/14/2016   K 4.4 03/14/2016   CREATININE 0.77 03/14/2016    Assessment: Blood pressure control:  controlled Progress toward BP goal:   stable Comments: Patient reports compliance with hydrochlorothiazide 12.5 mg daily and new addition of losartan 50 mg daily she states that the previous provider to see her discontinue lisinopril and started losartan due to her right cough. She states that the dry cough is now resolved.  Plan: Medications:  continue current medications

## 2016-06-12 NOTE — Assessment & Plan Note (Signed)
Patient reports a 3 month history of worsening floaters in her left visual field. Patient states she has a history of floaters in the past, but these appear larger than previously. She denies any change in her vision. She denies any sensation of a shade being pulled over her eye. The floaters are worst in her left visual field. She does not follow with an eye doctor. On physical exam, extraocular muscles are intact, conjunctiva appears normally. Pupils are equal and reactive to light and accommodation bilaterally. Visual acuity is grossly intact with classes. Visual fields show possible decreased peripheral vision bilaterally.  Assessment: Floaters. The cause of the floaters may be normal due to the vitreous fluid. However, we are also concerned for glaucoma and retinal detachment.  Plan: -Refer to eye doctor

## 2016-06-12 NOTE — Assessment & Plan Note (Signed)
Lab Results  Component Value Date   HGBA1C 5.8 06/11/2016   HGBA1C 6.3 06/06/2015   HGBA1C 6.4 01/17/2015     Assessment: Diabetes control:  controlled Progress toward A1C goal:   at goal Comments: Diet controlled  Plan: Medications:  Continue to monitor off of medications Instruction/counseling given: discussed the need for weight loss and discussed diet Other plans: Repeat hemoglobin A1c in one year

## 2016-06-12 NOTE — Assessment & Plan Note (Signed)
Patient is agreeable to switching from fluoxetine to duloxetine for additional benefit of pain control. In order to do so, we will taper her from her fluoxetine 40 mg daily down to fluoxetine 20 mg daily for one week. We will then proceed with a washout period of 7 days. Patient is then to start a low-dose duloxetine 30 mg daily. At follow-up, patient should have her pain and depression evaluated including a PHQ-9 and consider increasing duloxetine to 60 mg daily.

## 2016-06-16 NOTE — Progress Notes (Signed)
Internal Medicine Clinic Attending  Case discussed with Dr. Burns soon after the resident saw the patient.  We reviewed the resident's history and exam and pertinent patient test results.  I agree with the assessment, diagnosis, and plan of care documented in the resident's note. 

## 2016-06-18 ENCOUNTER — Encounter: Payer: Self-pay | Admitting: *Deleted

## 2016-07-28 ENCOUNTER — Ambulatory Visit (INDEPENDENT_AMBULATORY_CARE_PROVIDER_SITE_OTHER): Payer: Self-pay | Admitting: Internal Medicine

## 2016-07-28 ENCOUNTER — Encounter: Payer: Self-pay | Admitting: Internal Medicine

## 2016-07-28 VITALS — BP 139/88 | HR 73 | Temp 98.6°F | Ht 66.0 in | Wt 259.5 lb

## 2016-07-28 DIAGNOSIS — N898 Other specified noninflammatory disorders of vagina: Secondary | ICD-10-CM

## 2016-07-28 DIAGNOSIS — Z79899 Other long term (current) drug therapy: Secondary | ICD-10-CM

## 2016-07-28 DIAGNOSIS — Z8249 Family history of ischemic heart disease and other diseases of the circulatory system: Secondary | ICD-10-CM

## 2016-07-28 DIAGNOSIS — I10 Essential (primary) hypertension: Secondary | ICD-10-CM

## 2016-07-28 DIAGNOSIS — H43392 Other vitreous opacities, left eye: Secondary | ICD-10-CM

## 2016-07-28 DIAGNOSIS — M5416 Radiculopathy, lumbar region: Secondary | ICD-10-CM

## 2016-07-28 MED ORDER — DULOXETINE HCL 30 MG PO CPEP: 60.0000 mg | ORAL_CAPSULE | Freq: Every day | ORAL | 1 refills | Status: DC

## 2016-07-28 MED ORDER — RANITIDINE HCL 150 MG PO TABS: 150.0000 mg | ORAL_TABLET | Freq: Two times a day (BID) | ORAL | 1 refills | Status: DC

## 2016-07-28 MED ORDER — GABAPENTIN 300 MG PO CAPS: 600.0000 mg | ORAL_CAPSULE | Freq: Every day | ORAL | 1 refills | Status: DC

## 2016-07-28 MED ORDER — NAPROXEN 500 MG PO TABS: 500.0000 mg | ORAL_TABLET | Freq: Two times a day (BID) | ORAL | 1 refills | Status: DC

## 2016-07-28 MED ORDER — LOSARTAN POTASSIUM-HCTZ 50-12.5 MG PO TABS: 1.0000 | ORAL_TABLET | Freq: Every day | ORAL | 1 refills | Status: DC

## 2016-07-28 MED FILL — GABAPENTIN 300 MG CAPSULE: 300 | 30 days supply | Qty: 60 | Fill #0

## 2016-07-28 MED FILL — DULoxetine HCL 30 MG CPEP: 30 | 30 days supply | Qty: 60 | Fill #0

## 2016-07-28 MED FILL — LOSARTAN-HCTZ 50-12.5 MG TA: 50-12.5 | 30 days supply | Qty: 30 | Fill #0

## 2016-07-28 MED FILL — NAPROXEN 500 MG TABLET: 500 | 30 days supply | Qty: 60 | Fill #0

## 2016-07-28 MED FILL — raNITIdine HCL 150 MG TABS: 150 | 30 days supply | Qty: 60 | Fill #0

## 2016-07-28 NOTE — Assessment & Plan Note (Signed)
Assessment: Chronic lumbar radiculopathy Patient has had four clinic visits for complaints of chronic lumbar pain since the start of this year. She is currently taking naproxen 500 mg twice a day, gabapentin 600mg  at bedtime, Cymbalta 30 mg daily and Flexeril. She reports mild benefit from cymbalta.  At this point will try and titrate to 60mg  to see if there is added benefit.  Patient does not have imaging of her spine and may benefit to better assess cause of pain.  However, due to lack of insurance obtaining imaging would be difficult financially.  Patient denies weakness or red flag symptoms.  Strength was 5/5 on exam in upper and lower extremities bilaterally.  Plan -refill gabapentin -increase cymbalta to 60mg  from 30mg  -refill naproxen

## 2016-07-28 NOTE — Assessment & Plan Note (Signed)
Assessment: floaters in left visual field Patient reports a history of worsening floaters in her left visual field for the past 2-3 months.  She does not follow with an eye doctor. On physical exam extraocular muscles are intact. Pupils were equal and reactive to light. Visual field showed possible decreased peripheral vision bilaterally.  Plan -Optometry referral

## 2016-07-28 NOTE — Patient Instructions (Addendum)
Ms. Renier,  Please start taking Cymbalta 2 pills a day (60mg ).  Your refills have been sent to the pharmacy. A referral for optometry has been sent. Please follow up with your primary care physician at the end of August or beginning of September

## 2016-07-28 NOTE — Assessment & Plan Note (Signed)
Assessment: Vaginal discharge Patient has a two-month history of vaginal discharge with mild change in odor. She denies urinary symptoms of dysuria or increased frequency.  She denies pruritus.  She is currently sexually active.  Patient had a Pap smear on 02/2016 that did not show HPV. Will do a pelvic exam with wet prep and GC at today's visit  Plan -Pelvic exam with wet prep and GC

## 2016-07-28 NOTE — Progress Notes (Signed)
   CC: Follow up on chronic back pain   HPI:  [name] is a 56 y.o. with history noted below that presents to the acute care clinic for follow-up on chronic lower back pain. Please see problem based charting for the status of patient's chronic medical conditions.  Past Medical History:  Diagnosis Date  . Breast cancer ([place])   . Hypertension   . Tenosynovitis of foot and ankle 09/28/2013    Review of Systems:  Review of Systems  Constitutional: Negative for weight loss.  Respiratory: Negative for shortness of breath.   Cardiovascular: Negative for chest pain.  Genitourinary:       Vaginal discharge   Musculoskeletal: Positive for back pain.  Neurological: Negative for tingling, sensory change, focal weakness and weakness.     Physical Exam:  Vitals:   07/28/16 1323  BP: 139/88  Pulse: 73  Temp: 98.6 F (37 C)  TempSrc: Oral  SpO2: 100%  Weight: 259 lb 8 oz (117.7 kg)  Height: 5\' 6"  (1.676 m)   Physical Exam  Constitutional: She is well-developed, well-nourished, and in no distress.  Obese   Eyes: Pupils are equal, round, and reactive to light. Conjunctivae and EOM are normal.  Decrease in peripheral visual field bilaterally   Cardiovascular: Normal rate, regular rhythm and normal heart sounds.  Exam reveals no gallop and no friction rub.   No murmur heard. Pulmonary/Chest: Effort normal and breath sounds normal. No respiratory distress. She has no wheezes. She has no rales.  Musculoskeletal: She exhibits no edema.  Tenderness to L4-L5 region paraspinal musculature   Neurological:  5/5 in motor strength bilaterally in upper and lower extremities  Skin: Skin is warm and dry.    Assessment & Plan:   See encounters tab for problem based medical decision making.    Patient discussed with Dr. Lynnae January

## 2016-07-28 NOTE — Assessment & Plan Note (Signed)
Assessment: Essential hypertension Patient reports compliance with her losartan-hydrochlorothiazide. Today's blood pressure was 139/88 stable and at goal.  Will continue with current regimen.  Plan -Refill losartan-hydrochlorothiazide

## 2016-07-29 LAB — CERVICOVAGINAL ANCILLARY ONLY
Bacterial vaginitis: POSITIVE — AB
CHLAMYDIA, DNA PROBE: NEGATIVE
Candida vaginitis: NEGATIVE
Neisseria Gonorrhea: NEGATIVE
TRICH (WINDOWPATH): POSITIVE — AB

## 2016-07-29 NOTE — Progress Notes (Signed)
Internal Medicine Clinic Attending  Case discussed with Dr. Hoffman at the time of the visit.  We reviewed the resident's history and exam and pertinent patient test results.  I agree with the assessment, diagnosis, and plan of care documented in the resident's note.  

## 2016-07-30 ENCOUNTER — Other Ambulatory Visit: Payer: Self-pay | Admitting: Internal Medicine

## 2016-07-30 MED ORDER — METRONIDAZOLE 500 MG PO TABS: 500.0000 mg | ORAL_TABLET | Freq: Two times a day (BID) | ORAL | 0 refills | Status: DC

## 2016-07-30 MED FILL — metroNIDAZOLE 500 MG TABS: 500 | 7 days supply | Qty: 14 | Fill #0

## 2016-09-10 ENCOUNTER — Encounter: Payer: Self-pay | Admitting: Internal Medicine

## 2016-10-09 ENCOUNTER — Ambulatory Visit: Payer: Self-pay

## 2016-10-13 MED FILL — LOSARTAN-HCTZ 50-12.5 MG TA: 50-12.5 | 30 days supply | Qty: 30 | Fill #1

## 2016-10-13 MED FILL — raNITIdine HCL 150 MG TABS: 150 | 30 days supply | Qty: 60 | Fill #1

## 2016-10-13 MED FILL — NAPROXEN 500 MG TABLET: 500 | 30 days supply | Qty: 60 | Fill #1

## 2016-10-14 ENCOUNTER — Encounter (INDEPENDENT_AMBULATORY_CARE_PROVIDER_SITE_OTHER): Payer: Self-pay

## 2016-10-14 ENCOUNTER — Ambulatory Visit: Payer: Self-pay

## 2016-11-12 ENCOUNTER — Encounter: Payer: Self-pay | Admitting: Internal Medicine

## 2016-11-12 ENCOUNTER — Ambulatory Visit (INDEPENDENT_AMBULATORY_CARE_PROVIDER_SITE_OTHER): Payer: Medicare Other | Admitting: Internal Medicine

## 2016-11-12 DIAGNOSIS — M25561 Pain in right knee: Secondary | ICD-10-CM | POA: Diagnosis not present

## 2016-11-12 DIAGNOSIS — Z79899 Other long term (current) drug therapy: Secondary | ICD-10-CM | POA: Diagnosis not present

## 2016-11-12 DIAGNOSIS — G5601 Carpal tunnel syndrome, right upper limb: Secondary | ICD-10-CM | POA: Diagnosis not present

## 2016-11-12 DIAGNOSIS — Z0000 Encounter for general adult medical examination without abnormal findings: Secondary | ICD-10-CM

## 2016-11-12 DIAGNOSIS — M5441 Lumbago with sciatica, right side: Secondary | ICD-10-CM

## 2016-11-12 DIAGNOSIS — M722 Plantar fascial fibromatosis: Secondary | ICD-10-CM | POA: Diagnosis not present

## 2016-11-12 DIAGNOSIS — F331 Major depressive disorder, recurrent, moderate: Secondary | ICD-10-CM

## 2016-11-12 DIAGNOSIS — I10 Essential (primary) hypertension: Secondary | ICD-10-CM

## 2016-11-12 DIAGNOSIS — M5416 Radiculopathy, lumbar region: Secondary | ICD-10-CM

## 2016-11-12 DIAGNOSIS — G8929 Other chronic pain: Secondary | ICD-10-CM

## 2016-11-12 DIAGNOSIS — Z8742 Personal history of other diseases of the female genital tract: Secondary | ICD-10-CM

## 2016-11-12 DIAGNOSIS — N898 Other specified noninflammatory disorders of vagina: Secondary | ICD-10-CM

## 2016-11-12 DIAGNOSIS — M25562 Pain in left knee: Secondary | ICD-10-CM | POA: Diagnosis not present

## 2016-11-12 DIAGNOSIS — R7303 Prediabetes: Secondary | ICD-10-CM

## 2016-11-12 MED ORDER — DULOXETINE HCL 30 MG PO CPEP: 60.0000 mg | ORAL_CAPSULE | Freq: Every day | ORAL | 3 refills | Status: DC

## 2016-11-12 MED ORDER — PRAVASTATIN SODIUM 40 MG PO TABS: 40.0000 mg | ORAL_TABLET | Freq: Every day | ORAL | 11 refills | Status: DC

## 2016-11-12 MED ORDER — GABAPENTIN 300 MG PO CAPS: 600.0000 mg | ORAL_CAPSULE | Freq: Every day | ORAL | 3 refills | Status: DC

## 2016-11-12 MED ORDER — LOSARTAN POTASSIUM-HCTZ 50-12.5 MG PO TABS: 1.0000 | ORAL_TABLET | Freq: Every day | ORAL | 3 refills | Status: DC

## 2016-11-12 MED ORDER — RANITIDINE HCL 150 MG PO TABS: 150.0000 mg | ORAL_TABLET | Freq: Two times a day (BID) | ORAL | 3 refills | Status: DC

## 2016-11-12 MED ORDER — CYCLOBENZAPRINE HCL 5 MG PO TABS: 5.0000 mg | ORAL_TABLET | Freq: Every evening | ORAL | 3 refills | Status: DC | PRN

## 2016-11-12 MED FILL — GABAPENTIN 300 MG CAPSULE: 300 | 30 days supply | Qty: 60 | Fill #0

## 2016-11-12 MED FILL — DULoxetine HCL 30 MG CPEP: 30 | 30 days supply | Qty: 60 | Fill #0

## 2016-11-12 MED FILL — LOSARTAN-HCTZ 50-12.5 MG TA: 50-12.5 | 30 days supply | Qty: 30 | Fill #0

## 2016-11-12 MED FILL — raNITIdine HCL 150 MG TABS: 150 | 30 days supply | Qty: 60 | Fill #0

## 2016-11-12 MED FILL — PRAVASTATIN NA 40 MG TAB: 40 | 30 days supply | Qty: 30 | Fill #0

## 2016-11-12 NOTE — Progress Notes (Signed)
   CC: Routine visit  HPI:  [name] is a 56 y.o. female who presents today for routine evaluation.  She states that since her previous visit she is continued to experience plantar fasciitis based pain, right sided carpal tunnel, right sided positional lower back pain, and right knee pain.  These pains are moderately relieved with her Cymbalta, Neurontin, and Flexeril.  However, the symptoms do not completely resolve with these medications.  In addition combination of these medications is somewhat sedating which does not concern her.  She has visited multiple orthopedic surgeons in the past for evaluation of her right knee pain and has received glucocorticoid injections. The injections are no longer of benefit to her prompting an increased desire to pursue surgery. However, she has been advised by multiple surgeons that she must decrease her BMI to less than 40 to undergo further evaluation.  In addition the patient is concerned with depressive symptoms secondary to inability to free herself of her 2 younger children and grandchild.  She states that they are always present at her home, arguing, and failed to give her enough free/personal time to decompress.  As such she states that she may just need to leave the house.  Patient has questions regarding disability.  She would like to be considered for disability given her inability to stand for significant period of time which will work given multiple pain sources that she experiences.  Please see individual assessment and plan for more specific information  Past Medical History:  Diagnosis Date  . Breast cancer ([place])   . Hypertension   . Tenosynovitis of foot and ankle 09/28/2013   Review of Systems: ROS negative except as per HPI.  Physical Exam:  Vitals:   11/12/16 1531  BP: 136/89  Pulse: 69  Temp: 97.8 F (36.6 C)  TempSrc: Oral  SpO2: 100%  Weight: 263 lb 4.8 oz (119.4 kg)   Physical Exam  Constitutional: She is  oriented to person, place, and time. She appears well-developed and well-nourished. No distress.  HENT:  Head: Normocephalic and atraumatic.  Eyes: Conjunctivae and EOM are normal.  Neck: Normal range of motion. Neck supple.  Cardiovascular: Normal rate and regular rhythm.   No murmur heard. Pulmonary/Chest: Effort normal and breath sounds normal.  Abdominal: Soft. Bowel sounds are normal. There is no tenderness.  Musculoskeletal: She exhibits no edema or tenderness.  Neurological: She is alert and oriented to person, place, and time.  Skin: Skin is warm. Capillary refill takes less than 2 seconds.  Psychiatric: She has a normal mood and affect.  Nursing note and vitals reviewed.   Assessment & Plan:   With regard to her disability application. She was advised that for me to assist her with this, I would have to better get to know her through multiple visits and evaluations. I believe that many of the issues she faces can be remedied with proper treatment and thus may not necessarily warrant permanent disability.    See Encounters Tab for problem based charting.  Patient seen with Dr. Lynnae January

## 2016-11-12 NOTE — Assessment & Plan Note (Signed)
Assessment: Vaginal discharge has resolved she is currently asymptomatic.  Plan: Patient advised to seek treatment if his condition recurs.

## 2016-11-12 NOTE — Patient Instructions (Signed)
Please continue to monitor your dietary intake to better assist you with weight loss. It is essential that you continue your progress with lowering your BMI to less than 40 so that you may be eligible for surgery. You are currently at 42 so not too far from your goal. The reduction in knee pain may better enable you to lose additional weight further decreasing your overal level of inflammation and thus benefiting you in many ways.   As discussed, please continue to take your current medications. Also, please continue stretching your foot as you were previously advised to treat the plantar fascitis. Use of Ice and periodic NSAIDS (ibuprofen tylenol, Asprin) on occasion may benefit as well.   In addition, I would advise you to establish ground rules with your children to better benefit your mental health, which in turn can improve you physical health.  Thank you for your visit to the [facility]. It was nice to meet you today.

## 2016-11-12 NOTE — Assessment & Plan Note (Signed)
Assessment: Patient continues to be concerned with sharp pains in the plantar aspect of her foot. She has no longer receiving glucocorticoid injections as per the podiatrist due to cost and the failure of the treatment. She states that she has discontinued her stretching, ice, rest, and NSAIDs for treatment as the pain had initially improved. States that she does not see the potential for benefit with follow-up with Dr. Amalia Hailey. She is not currently interested in foot braces for nightly use.  Plan: Advised patient to continue plantar stretching, calf stretching, hamstring stretching, Patient said that she was aware of the proper technique for stretching the posterior portion of her thighs distal extremities the feet and Achilles tendons particularly. Advised patient to continue using ice Advised patient to use NSAIDs intermittently for severe pain or if she knows that she will be on her feet for more than a few hours Advised patient to call if no improvement.

## 2016-11-12 NOTE — Assessment & Plan Note (Signed)
Assessment: BP 136/89  Plan: Continue current therapy.

## 2016-11-12 NOTE — Assessment & Plan Note (Signed)
Assessment: Patient has a history of major depressive disorder.  Scored 13 on the PHQ-9 today. Patient states that she has no suicidal thoughts or ideation, or homicidal thoughts or ideations.  Denies a plan or  thoughts of hurting herself in any manner.  Plan: Patient advised to discuss boundaries and work around with her family as she needs time for herself.  It is babysitting her grandson child a lot recent past.  In this child still lives at home and is always present.  Patient does not get out much such as constantly stressed on dealing with her children and grandchild. Please follow-up with results of this discussion at her next visit

## 2016-11-12 NOTE — Assessment & Plan Note (Addendum)
Assessment:  Patient concerned with bilateral knee pain with right worse than left. Has seen multiple orthopedic surgeons in the past all of whom have informed the patient that she must decrease her BMI to less than 40 before they were operated.  Plan: Patient advised to continue her dieting and exercise as tolerated in order to decrease her BMI to less than 40. She is willing to meet with a nutritionist at this time as she has met in the past but nothing this is beneficial to her. Patient was given information regarding [employer] financial aid program and states that she will attempt to sign up for the [facility] and begin water aerobics in the hopes of losing her weight. Patient states that she is willing to pay for a membership to the [facility] if it is otherwise unobtainable as this would benefit her greatly. She is aware that her BMI must be reduced in order to receive surgery as per the orthopedic surgeon's notes. Patient opted to forego laboratory testing at this time as she is not currently fully insured.

## 2016-11-13 MED FILL — CYCLOBENZAPRINE 5 MG TABLET: 5 | 30 days supply | Qty: 30 | Fill #0 | Status: TO

## 2016-11-17 NOTE — Progress Notes (Signed)
Internal Medicine Clinic Attending  I saw and evaluated the patient.  I personally confirmed the key portions of the history and exam documented by Dr. Harbrecht and I reviewed pertinent patient test results.  The assessment, diagnosis, and plan were formulated together and I agree with the documentation in the resident's note.  

## 2017-01-09 ENCOUNTER — Ambulatory Visit: Payer: Self-pay

## 2017-01-15 ENCOUNTER — Ambulatory Visit (INDEPENDENT_AMBULATORY_CARE_PROVIDER_SITE_OTHER): Payer: Self-pay | Admitting: Internal Medicine

## 2017-01-15 ENCOUNTER — Encounter (INDEPENDENT_AMBULATORY_CARE_PROVIDER_SITE_OTHER): Payer: Self-pay

## 2017-01-15 ENCOUNTER — Other Ambulatory Visit: Payer: Self-pay

## 2017-01-15 VITALS — BP 135/81 | HR 74 | Temp 98.7°F | Wt 263.8 lb

## 2017-01-15 DIAGNOSIS — N393 Stress incontinence (female) (male): Secondary | ICD-10-CM

## 2017-01-15 DIAGNOSIS — M545 Low back pain, unspecified: Secondary | ICD-10-CM

## 2017-01-15 DIAGNOSIS — C50919 Malignant neoplasm of unspecified site of unspecified female breast: Secondary | ICD-10-CM

## 2017-01-15 DIAGNOSIS — F331 Major depressive disorder, recurrent, moderate: Secondary | ICD-10-CM

## 2017-01-15 DIAGNOSIS — I10 Essential (primary) hypertension: Secondary | ICD-10-CM

## 2017-01-15 DIAGNOSIS — G8929 Other chronic pain: Secondary | ICD-10-CM

## 2017-01-15 DIAGNOSIS — M1711 Unilateral primary osteoarthritis, right knee: Secondary | ICD-10-CM

## 2017-01-15 DIAGNOSIS — M722 Plantar fascial fibromatosis: Secondary | ICD-10-CM

## 2017-01-15 DIAGNOSIS — M47896 Other spondylosis, lumbar region: Secondary | ICD-10-CM

## 2017-01-15 DIAGNOSIS — Z791 Long term (current) use of non-steroidal anti-inflammatories (NSAID): Secondary | ICD-10-CM

## 2017-01-15 DIAGNOSIS — Z79899 Other long term (current) drug therapy: Secondary | ICD-10-CM

## 2017-01-15 DIAGNOSIS — M6588 Other synovitis and tenosynovitis, other site: Secondary | ICD-10-CM

## 2017-01-15 NOTE — Progress Notes (Signed)
   CC: R sided back pain, depression   HPI:  [name] is a 57 y.o. F with a past medical history of HTN, OA of multiple joints (low back, R knee), tenosynovitis of R wrist, plantar fascitis, depression, and remote breast cancer who presents to the clinic with complaints of R sided back/side pain and depressed mood.   Back Pain: She reports constant achy and sharp right-sided back pain which extends to her right side/flank.  The pain worsens in the mornings and at night, as well as with activities and movement.  It has begun to interfere with sleep but she is still able to perform her daily activities.  The pain does not radiate to the groin or abdomen.  She denies abdominal pain, dysuria, hematuria.  She notes chronic stress urinary incontinence without acute changes or bowel incontinence.  She feels this pain is similar to her chronic lower back pain but the location along her side is different than previous.  Depression: She also notes depressed mood since the end of November with intermittent periods of crying, feelings of guilt, and sleep disturbances.  She attributes her depressed mood to life stressors and spending time during the holidays apart from her family.  She denies suicidal or homicidal ideation.  Past Medical History:  Diagnosis Date  . Breast cancer ([place])   . Hypertension   . Tenosynovitis of foot and ankle 09/28/2013   Review of Systems:  Review of Systems  Constitutional: Negative for fever.  Gastrointestinal: Negative for abdominal pain.  Genitourinary: Negative for dysuria and hematuria.  Musculoskeletal: Positive for back pain and joint pain.  Psychiatric/Behavioral: Positive for depression. Negative for suicidal ideas.     Physical Exam:  Vitals:   01/15/17 0915  BP: 135/81  Pulse: 74  Temp: 98.7 F (37.1 C)  TempSrc: Oral  SpO2: 97%  Weight: 263 lb 12.8 oz (119.7 kg)   General: Sitting in chair comfortably, no acute distress CV: Regular rate  and rhythm Resp: Clear breath sounds, normal work of breathing, no distress  Abd: Soft, non-tender to palpation, remote well healed incisional scar Msk: No midline spinal tenderness, tenderness to palpation just off midline to bilateral paraspinal muscles of lumbar region, TTP extends to L side/flank. No CVA tenderness bilaterally. Twisting and bending ROM of spine only slightly limited due to pain. Negative straight leg raise bilaterally. 5/5 LE strength bilaterally, sensation intact   Neuro: Alert and oriented x3  Psych: No SI/HI, appropriate and variable affect  Skin: Warm, dry      Assessment & Plan:   See Encounters Tab for problem based charting.  Patient discussed with Dr. Evette Doffing

## 2017-01-15 NOTE — Patient Instructions (Addendum)
Nice to meet you today Maria Sims.  We think your side pain is likely related to your back pain based on your description and examination.  You are on the right medicines to treat this but it often has ups and downs.  You can also add Tylenol to the Naprosyn and other medications.  You can take 1000 mg every 6 hours or up to 4 times a day.   For your mood, you are on the highest dose of the Cymbalta which will also help with your pain.  Let us know if you feel that you would benefit from speaking to a counselor at your next visit with your PCP.

## 2017-01-16 NOTE — Progress Notes (Signed)
Internal Medicine Clinic Attending  Case discussed with Dr. Harden at the time of the visit.  We reviewed the resident's history and exam and pertinent patient test results.  I agree with the assessment, diagnosis, and plan of care documented in the resident's note.  

## 2017-01-16 NOTE — Assessment & Plan Note (Signed)
The patient reports pain consistent with chronic right-sided lower back pain, now with extension into the musculature of her right flank/side.  This pain is not colicky in nature with no radiation to the groin, no abdominal symptoms and overall  does not appear to be from an intra-abdominal or retroperitoneal origin.  She currently takes gabapentin, Cymbalta, Flexeril, naproxen for her symptoms.  She has been trying to lose weight which will help her symptoms in the long run as well, and she plans to try water aerobics in the near future.  Impression may also be exacerbating her somatic symptoms.  She was encouraged to alternate with Tylenol 1000 mg 3-4 times a day along with her current medication regimen.

## 2017-01-16 NOTE — Assessment & Plan Note (Signed)
Patient is demonstrating signs of recurrent depression, PHQ 9 score today was 20, increased from prior.  She denied suicidal ideation or thoughts of self-harm.  She states she feels Cymbalta is a good medicine for her and she was also offered counseling or therapy referral.  However, she stated she feels that she can work through her  stressors without professional help at this time as she believes the holiday season was a large component of her depressed mood. Cymbalta is at highest recommended dose.   --Continue Cymbalta  --Reassess need/willingness for counseling at follow up

## 2017-01-30 MED FILL — NAPROXEN 500 MG TABLET: 500 | 30 days supply | Qty: 60 | Fill #1

## 2017-01-30 MED FILL — DULoxetine HCL 30 MG CPEP: 30 | 30 days supply | Qty: 60 | Fill #1 | Status: TO

## 2017-01-30 MED FILL — PRAVASTATIN NA 40 MG TAB: 40 | 30 days supply | Qty: 30 | Fill #1

## 2017-01-30 MED FILL — GABAPENTIN 300 MG CAPSULE: 300 | 30 days supply | Qty: 60 | Fill #1 | Status: TO

## 2017-01-30 MED FILL — LOSARTAN-HCTZ 50-12.5 MG TA: 50-12.5 | 30 days supply | Qty: 30 | Fill #1

## 2017-01-30 MED FILL — raNITIdine HCL 150 MG TABS: 150 | 30 days supply | Qty: 60 | Fill #1 | Status: TO

## 2017-03-24 NOTE — Addendum Note (Signed)
Addended by: Hulan Fray on: 03/24/2017 06:38 PM   Modules accepted: Orders

## 2017-03-26 MED FILL — PRAVASTATIN NA 40 MG TAB: 40 | 30 days supply | Qty: 30 | Fill #2 | Status: TO

## 2017-03-26 MED FILL — LOSARTAN-HCTZ 50-12.5 MG TA: 50-12.5 | 30 days supply | Qty: 30 | Fill #2 | Status: TO

## 2017-03-26 MED FILL — NAPROXEN 500 MG TABLET: 500 | 30 days supply | Qty: 60 | Fill #2 | Status: TO

## 2017-04-02 ENCOUNTER — Other Ambulatory Visit: Payer: Self-pay | Admitting: Internal Medicine

## 2017-04-02 DIAGNOSIS — Z1231 Encounter for screening mammogram for malignant neoplasm of breast: Secondary | ICD-10-CM

## 2017-04-15 ENCOUNTER — Encounter (INDEPENDENT_AMBULATORY_CARE_PROVIDER_SITE_OTHER): Payer: Self-pay

## 2017-04-15 ENCOUNTER — Ambulatory Visit (INDEPENDENT_AMBULATORY_CARE_PROVIDER_SITE_OTHER): Payer: PPO | Admitting: Internal Medicine

## 2017-04-15 ENCOUNTER — Encounter: Payer: Self-pay | Admitting: Internal Medicine

## 2017-04-15 ENCOUNTER — Other Ambulatory Visit: Payer: Self-pay

## 2017-04-15 VITALS — BP 123/77 | HR 69 | Temp 98.3°F | Wt 265.4 lb

## 2017-04-15 DIAGNOSIS — Z853 Personal history of malignant neoplasm of breast: Secondary | ICD-10-CM

## 2017-04-15 DIAGNOSIS — H5213 Myopia, bilateral: Secondary | ICD-10-CM | POA: Diagnosis not present

## 2017-04-15 DIAGNOSIS — M159 Polyosteoarthritis, unspecified: Secondary | ICD-10-CM

## 2017-04-15 DIAGNOSIS — I10 Essential (primary) hypertension: Secondary | ICD-10-CM | POA: Diagnosis not present

## 2017-04-15 DIAGNOSIS — Z79899 Other long term (current) drug therapy: Secondary | ICD-10-CM | POA: Diagnosis not present

## 2017-04-15 DIAGNOSIS — M25512 Pain in left shoulder: Secondary | ICD-10-CM

## 2017-04-15 DIAGNOSIS — H524 Presbyopia: Secondary | ICD-10-CM | POA: Diagnosis not present

## 2017-04-15 DIAGNOSIS — N644 Mastodynia: Secondary | ICD-10-CM | POA: Diagnosis not present

## 2017-04-15 DIAGNOSIS — R0789 Other chest pain: Secondary | ICD-10-CM

## 2017-04-15 DIAGNOSIS — M25511 Pain in right shoulder: Secondary | ICD-10-CM | POA: Insufficient documentation

## 2017-04-15 DIAGNOSIS — G8929 Other chronic pain: Secondary | ICD-10-CM

## 2017-04-15 MED ORDER — IBUPROFEN 200 MG PO TABS: 800.0000 mg | ORAL_TABLET | Freq: Three times a day (TID) | ORAL | 0 refills | Status: DC

## 2017-04-15 NOTE — Assessment & Plan Note (Signed)
Shoulder Pain: Left trapezius/ shoulder pain, intermittent, worse with heavy use of that arm. Has been an issue for five years. Possibly worse with walking, does not radiate. Clean cardiac cath in 03/13/2016. Had pain in her right arm that began with tingling sensation and progressed to her neck prior to spontaneous resolution. The pain is improved with immobility and tylenol. She has a history of arthritis in her feet knees and back.   Plan: believe this to be musculoskeletal in nature. There may be nerve impingement associated with this pain. Possibly arthritic cervical joint. I will start with NSAIDs daily and progress to physical therapy if needed.  --Ibuprofen 800mg  TID x5 days. --She will continue on Cymbalta 60 mg daily  -She will continue on gabapentin

## 2017-04-15 NOTE — Progress Notes (Signed)
   CC: Shoulder pain  HPI:[name] is a 57 y.o. who presents for evaluation of her chronic medical conditions, shoulder pain, and left breast pain.  Hypertension: Blood pressure is well controlled today at 123/77.  No adjustments indicated  Shoulder Pain: Left trapezius/ shoulder pain, intermittent, worse with heavy use of that arm. Has been an issue for five years. Possibly worse with walking, does not radiate. Clean cardiac cath in 03/13/2016. Had pain in her right arm that began with tingling sensation and progressed to her neck prior to spontaneous resolution. The pain is improved with immobility and tylenol. She has a history of arthritis in her feet knees and back.   Plan: believe this to be musculoskeletal in nature. There may be nerve impingement associated with this pain. Possibly arthritic cervical joint. I will start with NSAIDs daily and progress to physical therapy if needed.  --Ibuprofen 800mg  TID x5 days. --She will continue on Cymbalta 60 mg daily  -She will continue on gabapentin  Sharp Left Breast Pain: 3 days duration.  She denied pain, fever, redness, swelling, skin changes, nipple discharge, or other concerning symptoms in the left breast.  She states she has a history of right breast cancer.  She was told she needed a MD referral prior to being able to obtain a mammogram.   Plan: Physical exam revealed a focal tender point at approximately 2 x 3 cm at minimum  located at approximately the 2 to 3 o'clock position in the mid axillary region of the left breast.  There were no associated skin changes, skin dimpling, nipple discharge, palpable lymph nodes, or masses elsewhere in the breast on palpation. Order diagnostic mammogram with reflex to ultrasound if indicated  Past Medical History:  Diagnosis Date  . Breast cancer ([place])   . Hypertension   . Tenosynovitis of foot and ankle 09/28/2013   Review of Systems: ROS negative except as per HPI  Physical  Exam:  Vitals:   04/15/17 1336  BP: 123/77  Pulse: 69  Temp: 98.3 F (36.8 C)  TempSrc: Oral  SpO2: 100%  Weight: 265 lb 6.4 oz (120.4 kg)   General: In no acute distress, conversant, afebrile, Neuro: No acute focal deficits, alert and oriented x4 Cardiovascular: RRR, no murmurs rubs or gallops appreciated Pulmonary: Bilateral lungs are clear to auscultation Breast: As breast exam above GI: Abdomen is nontender, nondistended,   Assessment & Plan:   See Encounters Tab for problem based charting.  Patient discussed with Dr. Lynnae January

## 2017-04-15 NOTE — Patient Instructions (Signed)
FOLLOW-UP INSTRUCTIONS When: 3 months For: routine What to bring: all of your medication bottles   Thank you for y our visit to the [facility].   I have ordered a diagnostic mammogram. They will call you to schedule.   Thank you again.

## 2017-04-15 NOTE — Assessment & Plan Note (Signed)
Sharp Left Breast Pain: 3 days duration.  She denied pain, fever, redness, swelling, skin changes, nipple discharge, or other concerning symptoms in the left breast.  She states she has a history of right breast cancer.  She was told she needed a MD referral prior to being able to obtain a mammogram.   Plan: Physical exam revealed a focal tender point at approximately 2 x 3 cm at minimum  located at approximately the 2 to 3 o'clock position in the mid axillary region of the left breast.  There were no associated skin changes, skin dimpling, nipple discharge, palpable lymph nodes, or masses elsewhere in the breast on palpation. Order diagnostic mammogram with reflex to ultrasound if indicated

## 2017-04-15 NOTE — Assessment & Plan Note (Signed)
Hypertension: Blood pressure is well controlled today at 123/77.  No adjustments indicated

## 2017-04-16 NOTE — Progress Notes (Signed)
Internal Medicine Clinic Attending  Case discussed with Dr. Harbrecht at the time of the visit.  We reviewed the resident's history and exam and pertinent patient test results.  I agree with the assessment, diagnosis, and plan of care documented in the resident's note.   

## 2017-04-18 ENCOUNTER — Other Ambulatory Visit: Payer: Self-pay | Admitting: Internal Medicine

## 2017-04-18 DIAGNOSIS — N644 Mastodynia: Secondary | ICD-10-CM

## 2017-04-20 ENCOUNTER — Ambulatory Visit: Payer: Medicare Other

## 2017-04-20 ENCOUNTER — Ambulatory Visit
Admission: RE | Admit: 2017-04-20 | Discharge: 2017-04-20 | Disposition: A | Discharge status: Home / Self Care | Payer: PPO | Source: Ambulatory Visit | Attending: Internal Medicine | Admitting: Internal Medicine

## 2017-04-20 DIAGNOSIS — N644 Mastodynia: Secondary | ICD-10-CM

## 2017-04-20 DIAGNOSIS — R928 Other abnormal and inconclusive findings on diagnostic imaging of breast: Secondary | ICD-10-CM | POA: Diagnosis not present

## 2017-04-27 DIAGNOSIS — L668 Other cicatricial alopecia: Secondary | ICD-10-CM | POA: Diagnosis not present

## 2017-05-13 ENCOUNTER — Other Ambulatory Visit: Payer: Self-pay | Admitting: Internal Medicine

## 2017-05-13 DIAGNOSIS — M5416 Radiculopathy, lumbar region: Secondary | ICD-10-CM

## 2017-05-13 DIAGNOSIS — K219 Gastro-esophageal reflux disease without esophagitis: Secondary | ICD-10-CM

## 2017-05-14 ENCOUNTER — Telehealth: Payer: Self-pay | Admitting: Internal Medicine

## 2017-05-14 NOTE — Telephone Encounter (Signed)
Patient is requesting refill on gabapentin, [facility] in [place]

## 2017-05-15 ENCOUNTER — Encounter: Payer: Self-pay | Admitting: Family Medicine

## 2017-05-15 ENCOUNTER — Other Ambulatory Visit (HOSPITAL_COMMUNITY)
Admission: RE | Admit: 2017-05-15 | Discharge: 2017-05-15 | Disposition: A | Discharge status: Home / Self Care | Payer: PPO | Source: Ambulatory Visit | Attending: Family Medicine | Admitting: Family Medicine

## 2017-05-15 ENCOUNTER — Ambulatory Visit (INDEPENDENT_AMBULATORY_CARE_PROVIDER_SITE_OTHER): Payer: PPO | Admitting: Family Medicine

## 2017-05-15 VITALS — BP 136/75 | HR 74 | Wt 262.0 lb

## 2017-05-15 DIAGNOSIS — Z113 Encounter for screening for infections with a predominantly sexual mode of transmission: Secondary | ICD-10-CM | POA: Diagnosis not present

## 2017-05-15 DIAGNOSIS — R87810 Cervical high risk human papillomavirus (HPV) DNA test positive: Secondary | ICD-10-CM | POA: Diagnosis not present

## 2017-05-15 DIAGNOSIS — Z01411 Encounter for gynecological examination (general) (routine) with abnormal findings: Secondary | ICD-10-CM | POA: Insufficient documentation

## 2017-05-15 DIAGNOSIS — Z01419 Encounter for gynecological examination (general) (routine) without abnormal findings: Secondary | ICD-10-CM | POA: Diagnosis not present

## 2017-05-15 DIAGNOSIS — R87610 Atypical squamous cells of undetermined significance on cytologic smear of cervix (ASC-US): Secondary | ICD-10-CM | POA: Insufficient documentation

## 2017-05-15 MED ORDER — GABAPENTIN 300 MG PO CAPS: 600.0000 mg | ORAL_CAPSULE | Freq: Every day | ORAL | 3 refills | Status: DC

## 2017-05-15 NOTE — Progress Notes (Signed)
GYNECOLOGY ANNUAL PREVENTATIVE CARE ENCOUNTER NOTE  Subjective:   Maria Sims is a 57 y.o. G28P0 female here for a routine annual gynecologic exam.  Current complaints: none.   Denies abnormal vaginal bleeding, discharge, pelvic pain, problems with intercourse or other gynecologic concerns.    Has history of right lumpectomy for cancer. 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 Occasionally has tenderness in left breast.  Gynecologic History No LMP recorded. Patient has had a hysterectomy. Patient is sexually active - has history of trichomonas, both she and her partner were treated, but wants to be retested. Contraception: post menopausal status Last Pap: 2018. Results were: normal Last mammogram: 04/2017. Results were: normal  Obstetric History OB History  Gravida Para Term Preterm AB Living  4         3  SAB TAB Ectopic Multiple Live Births          3    # Outcome Date GA Lbr Len/2nd Weight Sex Delivery Anes PTL Lv  4 Gravida      CS-Unspec     3 Gravida           2 Gravida           1 [place]             Past Medical History:  Diagnosis Date  . Arthritis   . Breast cancer ([place])   . Hypertension   . Mental disorder   . Tenosynovitis of foot and ankle 09/28/2013    Past Surgical History:  Procedure Laterality Date  . ABDOMINAL HYSTERECTOMY    . BREAST LUMPECTOMY Right    32 years ago   . BREAST RECONSTRUCTION    . BUNIONECTOMY Left 01/26/2014   @ [facility]  . CHOLECYSTECTOMY    . COTTON OSTEOTOMY W/ BONE GRAFT Left 01/26/2014   @ [place]  . KNEE SURGERY    . LEFT HEART CATH AND CORONARY ANGIOGRAPHY N/A 03/20/2016   Procedure: Left Heart Cath and Coronary Angiography;  Surgeon: Peter M Martinique, MD;  Location: [facility];  Service: Cardiovascular;  Laterality: N/A;  . NECK SURGERY    . REDUCTION MAMMAPLASTY Bilateral     Current Outpatient Medications on File Prior to Visit  Medication Sig Dispense Refill  .  cyclobenzaprine (FLEXERIL) 5 MG tablet Take 1 tablet (5 mg total) by mouth at bedtime as needed for muscle spasms. 30 tablet 3  . DULoxetine (CYMBALTA) 30 MG capsule Take 2 capsules (60 mg total) by mouth daily. 60 capsule 3  . gabapentin (NEURONTIN) 300 MG capsule Take 2 capsules (600 mg total) by mouth at bedtime. 60 capsule 3  . ibuprofen (MOTRIN IB) 200 MG tablet Take 4 tablets (800 mg total) by mouth 3 (three) times daily. 15 tablet 0  . losartan-hydrochlorothiazide (HYZAAR) 50-12.5 MG tablet Take 1 tablet by mouth daily. 30 tablet 3  . metroNIDAZOLE (FLAGYL) 500 MG tablet Take 1 tablet (500 mg total) by mouth 2 (two) times daily. 14 tablet 0  . naproxen (NAPROSYN) 500 MG tablet Take 1 tablet (500 mg total) by mouth 2 (two) times daily with a meal. 60 tablet 1  . pantoprazole (PROTONIX) 40 MG tablet Take 1 tablet (40 mg total) by mouth daily. 30 tablet 11  . pravastatin (PRAVACHOL) 40 MG tablet Take 1 tablet (40 mg total) by mouth daily. 30 tablet 11  . nitroGLYCERIN (NITROSTAT) 0.4 MG SL tablet Place 1 tablet (0.4 mg total) under the  tongue every 5 (five) minutes as needed for chest pain. 100 tablet 3   No current facility-administered medications on file prior to visit.     Allergies  Allergen Reactions  . Pineapple Anaphylaxis and Rash  . Latex Itching and Swelling    redness    Social History   Socioeconomic History  . Marital status: Single    Spouse name: Not on file  . Number of children: Not on file  . Years of education: Not on file  . Highest education level: Not on file  Occupational History  . Not on file  Social Needs  . Financial resource strain: Not on file  . Food insecurity:    Worry: Not on file    Inability: Not on file  . Transportation needs:    Medical: Not on file    Non-medical: Not on file  Tobacco Use  . Smoking status: Never Smoker  . Smokeless tobacco: Never Used  Substance and Sexual Activity  . Alcohol use: Yes    Alcohol/week: 0.0 oz     Comment: Socially.  . Drug use: No  . Sexual activity: Not on file  Lifestyle  . Physical activity:    Days per week: Not on file    Minutes per session: Not on file  . Stress: Not on file  Relationships  . Social connections:    Talks on phone: Not on file    Gets together: Not on file    Attends religious service: Not on file    Active member of club or organization: Not on file    Attends meetings of clubs or organizations: Not on file    Relationship status: Not on file  . Intimate partner violence:    Fear of current or ex partner: Not on file    Emotionally abused: Not on file    Physically abused: Not on file    Forced sexual activity: Not on file  Other Topics Concern  . Not on file  Social History Narrative  . Not on file    Family History  Problem Relation Age of Onset  . Hypertension Mother   . Diabetes Father     The following portions of the patient's history were reviewed and updated as appropriate: allergies, current medications, past family history, past medical history, past social history, past surgical history and problem list.  Review of Systems Pertinent items noted in HPI and remainder of comprehensive ROS otherwise negative.   Objective:  BP 136/75   Pulse 74   Wt 262 lb (118.8 kg)   BMI 42.29 kg/m  CONSTITUTIONAL: Well-developed, well-nourished female in no acute distress.  HENT:  Normocephalic, atraumatic, External right and left ear normal. Oropharynx is clear and moist EYES: Conjunctivae and EOM are normal. Pupils are equal, round, and reactive to light. No scleral icterus.  NECK: Normal range of motion, supple, no masses.  Normal thyroid.   CARDIOVASCULAR: Normal heart rate noted, regular rhythm RESPIRATORY: Clear to auscultation bilaterally. Effort and breath sounds normal, no problems with respiration noted. BREASTS: Symmetric in size. No masses, skin changes, nipple drainage, or lymphadenopathy. Scars from reconstructive and reductive  surgeries present.  ABDOMEN: Soft, normal bowel sounds, no distention noted.  No tenderness, rebound or guarding.  PELVIC: Normal appearing external genitalia; normal appearing vaginal mucosa and cervix.  No abnormal discharge noted.  Pap smear obtained.  Normal uterine size, no other palpable masses, no uterine or adnexal tenderness. MUSCULOSKELETAL: Normal range of motion. No tenderness.  No cyanosis, clubbing, or edema.  2+ distal pulses. SKIN: Skin is warm and dry. No rash noted. Not diaphoretic. No erythema. No pallor. NEUROLOGIC: Alert and oriented to person, place, and time. Normal reflexes, muscle tone coordination. No cranial nerve deficit noted. PSYCHIATRIC: Normal mood and affect. Normal behavior. Normal judgment and thought content.  Assessment:  Annual gynecologic examination with pap smear   Plan:   1. Well woman exam with routine gynecological exam Will follow up results of pap smear and manage accordingly. STD testing discussed. Patient requested testing  - Cytology - PAP - HIV antibody (with reflex) - RPR - Hepatitis C Antibody - Hepatitis B Surface AntiBODY  2. Screening for STD (sexually transmitted disease)  - HIV antibody (with reflex) - RPR - Hepatitis C Antibody - Hepatitis B Surface AntiBODY   Routine preventative health maintenance measures emphasized. Please refer to After Visit Summary for other counseling recommendations.    Loma Boston, [place] for [employer]

## 2017-05-15 NOTE — Telephone Encounter (Addendum)
Patient returned call. Requesting [facility] in [place]. All other pharmacies removed from record. Gabapentin called to [facility] and cancelled at [place]. Hubbard Hartshorn, RN, BSN

## 2017-05-15 NOTE — Patient Instructions (Signed)

## 2017-05-15 NOTE — Telephone Encounter (Signed)
Patient requested this to be sent to [facility] in [place] but was sent to [place]. Left message on patient's VM requesting return call to discuss multiple pharmacies. Hubbard Hartshorn, RN, BSN

## 2017-05-16 LAB — RPR: RPR Ser Ql: NONREACTIVE

## 2017-05-16 LAB — HEPATITIS B SURFACE ANTIBODY,QUALITATIVE: HEP B SURFACE AB, QUAL: NONREACTIVE

## 2017-05-16 LAB — HIV ANTIBODY (ROUTINE TESTING W REFLEX): HIV SCREEN 4TH GENERATION: NONREACTIVE

## 2017-05-16 LAB — HEPATITIS C ANTIBODY: Hep C Virus Ab: <0.1 s/co ratio (ref 0.0–0.9)

## 2017-05-20 ENCOUNTER — Other Ambulatory Visit: Payer: Self-pay | Admitting: Internal Medicine

## 2017-05-20 LAB — CYTOLOGY - PAP
ADEQUACY: ABSENT — AB
BACTERIAL VAGINITIS: NEGATIVE
Candida vaginitis: NEGATIVE
Chlamydia: NEGATIVE
Diagnosis: UNDETERMINED — AB
HPV (WINDOPATH): DETECTED — AB
NEISSERIA GONORRHEA: NEGATIVE
Trichomonas: NEGATIVE

## 2017-05-26 ENCOUNTER — Telehealth: Payer: Self-pay

## 2017-05-26 NOTE — Telephone Encounter (Signed)
-----   Message from Donnamae Jude, MD sent at 05/20/2017  5:28 PM EDT ----- Has ASCUS with + HPV--needs colpo

## 2017-05-26 NOTE — Telephone Encounter (Signed)
Patient called and made aware of results from pap smear ASCUS pap with HPV. Patient states she has ha an abornmal pap smear in the past but it has been several years ago. We discussed that the next step is to do a colposcopy and she was scheduled to return for colposcopy on June 24th. Kathrene Alu RN

## 2017-06-14 ENCOUNTER — Other Ambulatory Visit: Payer: Self-pay | Admitting: Internal Medicine

## 2017-06-18 ENCOUNTER — Other Ambulatory Visit: Payer: Self-pay | Admitting: Internal Medicine

## 2017-06-18 NOTE — Telephone Encounter (Signed)
Last OV with PCP 04/15/2017  Next appt with PCP 07/15/2017

## 2017-06-19 ENCOUNTER — Other Ambulatory Visit: Payer: Self-pay | Admitting: *Deleted

## 2017-06-19 ENCOUNTER — Other Ambulatory Visit: Payer: Self-pay | Admitting: Internal Medicine

## 2017-06-19 MED ORDER — NAPROXEN 500 MG PO TABS: 500.0000 mg | ORAL_TABLET | Freq: Two times a day (BID) | ORAL | 1 refills | Status: DC

## 2017-06-29 DIAGNOSIS — L668 Other cicatricial alopecia: Secondary | ICD-10-CM | POA: Diagnosis not present

## 2017-07-06 ENCOUNTER — Encounter: Payer: Self-pay | Admitting: Family Medicine

## 2017-07-06 ENCOUNTER — Ambulatory Visit (INDEPENDENT_AMBULATORY_CARE_PROVIDER_SITE_OTHER): Payer: PPO | Admitting: Family Medicine

## 2017-07-06 VITALS — BP 119/81 | HR 75 | Resp 16 | Ht 66.0 in | Wt 265.0 lb

## 2017-07-06 DIAGNOSIS — R87610 Atypical squamous cells of undetermined significance on cytologic smear of cervix (ASC-US): Secondary | ICD-10-CM

## 2017-07-06 DIAGNOSIS — R87810 Cervical high risk human papillomavirus (HPV) DNA test positive: Secondary | ICD-10-CM

## 2017-07-06 NOTE — Progress Notes (Addendum)
Patient Name: Maria Sims, female   DOB: 09-04-1960, 57 y.o.  MRN: [id number]  Colposcopy Procedure Note:  G4P0 Pregnancy status: Unknown Indications: ASCUS, +HR HPV HPV:  Positive Cervical History:  Previous Abnormal Pap: none  Previous Colposcopy: none  Previous LEEP or Cryo: none  Smoking: Never Smoked Hysterectomy: Yes - supercevical   Patient given informed consent, signed copy in the chart, time out was performed.    Exam: Vulva and Vagina grossly normal.  Cervix viewed with speculum and colposcope after application of acetic acid:  Cervix Fully Visualized Squamocolumnar Junction Visibility: Not fully visualized  Acetowhite lesions: none  Other Lesions: None Punctation: Not present  Mosaicism: Not present Abnormal vasculature: No   Biopsies: 0 o'clock ECC: No  Hemostasis achieved with:  n/a  Colposcopy Impression:  unable to fully assess.\  After talking with several partners, will follow with close PAP smears every 6 months.  Patient was given post procedure instructions.

## 2017-07-15 ENCOUNTER — Other Ambulatory Visit: Payer: Self-pay

## 2017-07-15 ENCOUNTER — Encounter: Payer: Self-pay | Admitting: Internal Medicine

## 2017-07-15 ENCOUNTER — Ambulatory Visit (HOSPITAL_COMMUNITY)
Admission: RE | Admit: 2017-07-15 | Discharge: 2017-07-15 | Disposition: A | Discharge status: Home / Self Care | Payer: PPO | Source: Ambulatory Visit | Attending: Internal Medicine | Admitting: Internal Medicine

## 2017-07-15 ENCOUNTER — Ambulatory Visit (INDEPENDENT_AMBULATORY_CARE_PROVIDER_SITE_OTHER): Payer: PPO | Admitting: Internal Medicine

## 2017-07-15 VITALS — BP 136/76 | HR 84 | Temp 98.5°F | Ht 67.0 in | Wt 270.8 lb

## 2017-07-15 DIAGNOSIS — D649 Anemia, unspecified: Secondary | ICD-10-CM | POA: Diagnosis not present

## 2017-07-15 DIAGNOSIS — M545 Low back pain, unspecified: Secondary | ICD-10-CM

## 2017-07-15 DIAGNOSIS — R7303 Prediabetes: Secondary | ICD-10-CM

## 2017-07-15 DIAGNOSIS — G8929 Other chronic pain: Secondary | ICD-10-CM

## 2017-07-15 DIAGNOSIS — Z79899 Other long term (current) drug therapy: Secondary | ICD-10-CM

## 2017-07-15 DIAGNOSIS — D509 Iron deficiency anemia, unspecified: Secondary | ICD-10-CM | POA: Diagnosis not present

## 2017-07-15 DIAGNOSIS — M1711 Unilateral primary osteoarthritis, right knee: Secondary | ICD-10-CM

## 2017-07-15 DIAGNOSIS — I10 Essential (primary) hypertension: Secondary | ICD-10-CM

## 2017-07-15 DIAGNOSIS — M5136 Other intervertebral disc degeneration, lumbar region: Secondary | ICD-10-CM | POA: Diagnosis not present

## 2017-07-15 DIAGNOSIS — G2581 Restless legs syndrome: Secondary | ICD-10-CM

## 2017-07-15 DIAGNOSIS — Z0000 Encounter for general adult medical examination without abnormal findings: Secondary | ICD-10-CM

## 2017-07-15 DIAGNOSIS — M5416 Radiculopathy, lumbar region: Secondary | ICD-10-CM

## 2017-07-15 DIAGNOSIS — R5383 Other fatigue: Secondary | ICD-10-CM | POA: Diagnosis not present

## 2017-07-15 LAB — GLUCOSE, CAPILLARY
GLUCOSE-CAPILLARY: 63 mg/dL — AB (ref 70–99)
Glucose-Capillary: 77 mg/dL (ref 70–99)

## 2017-07-15 LAB — POCT GLYCOSYLATED HEMOGLOBIN (HGB A1C): Hemoglobin A1C: 6 % — AB (ref 4.0–5.6)

## 2017-07-15 NOTE — Patient Instructions (Signed)
FOLLOW-UP INSTRUCTIONS When: Three months For: Routine visit What to bring: PLEASE Bring all of your medications with you  I have ordered some lab work and Xrays to better assist in identifying the cause of your pain and fatigue.   If you have any questions please feel free to call us at any time.   Thank you for your visit to the [facility] today.

## 2017-07-15 NOTE — Assessment & Plan Note (Addendum)
HTN: Patient's blood pressure is 136/76 today.  This represents a consistent trend with a previous value of 119/81.  I have encouraged her to continue weight loss and dietary modifications in an effort to decrease her pressure but feel that we will need to continue the losartan hydrochlorothiazide at this time. Plan: BMP today-Patient called and results discussed with the patient. No changes made.  Losartan hydrochlorothiazide 50/12.5 mg daily continued

## 2017-07-15 NOTE — Assessment & Plan Note (Addendum)
Microcytic Anemia: Notable Hgb of 10.3 on 03/14/2016 without repeat labs since. Given her mild fatigue I will order a CBC to evaluate her for the microcytic anemia and if persistent with order Iron studies to better delineate vs starting empiric iron treatment.  CBC-Patient called and results discussed with the patient. Recommended Iron supplementation, ordered. Hgb stable.

## 2017-07-15 NOTE — Assessment & Plan Note (Signed)
  Prediabetes: A1c ordered, increased to 6.0 today. I fear that diet and exercise alone would be sufficient but are not being performed as per the patient.  As such, I have strongly encouraged her to continue water aerobics given her chronic pain, gentle walking, stationary biking, reducing her caloric intake, and has suggested that she meet with our dietitian.  She continues to consistently defer being with a dietitian and believes that she is able to make these changes without additional assistance.  She is positively predisposed to the concept of water aerobics and will begin that shortly. Following the AVS printing: Patient was noted to have hypoglycemia to 63mg /dL during evaluation today. This was not associated with symptoms as she denied dizziness, lightheadedness, diaphoresis, chills, confusion. I feel this is related to her [employer] Relacore supplement and I can find no other clear potential cause of this mild asymptomatic hypoglycemia. Her glucose will be confirmed with a BMP. Her level rose to 77 after 48min and a serving of apple juice. I recommended that she stop taking the supplement and notify us if any of the preceding symptoms occurred at any time.

## 2017-07-15 NOTE — Assessment & Plan Note (Addendum)
  Lumbar Pain: I feel this pain is possibly consistent with lumbar radiculopathy given her symptoms.  She however, has a symptoms only intermittently which are not associated with paresthesias of the perineal area nor any loss of bowel or bladder control.  The patient seeks evaluation but denies  desire for treatment as it is only intermittent but progressing. There is concern that given the involvement of her right knee, medially that this could potentially be related to her degenerative osteoarthritis of the right knee. I do not feel this to be a related issue as the pain radiates from the hip into the foot, is described as a shooting sensation and is only occassionally related to medial superficial right knee pain.  Plan: Given the length of her symptoms are greater than 6 weeks, failed improvement with ibuprofen as she is previously on, and third radicular nature of the pain I will order lumbar x-rays to evaluate for potential vertebral compression versus degeneration.  Xrays unremarkable. We will increase her gabapentin to 800mg  QHS and continue to monitor her symptoms.

## 2017-07-15 NOTE — Assessment & Plan Note (Signed)
  Health Maintenance: Schedule Colonoscopy? Patient stated that she is to see her OB/GYN who stated that they would perform this test. I find this to unlikely but she insists that they will be performing a stool test for colorectal cancer screening. I will reassess this following her appointment.

## 2017-07-15 NOTE — Progress Notes (Signed)
CC: Routine evaluation of her HTN, back pain and health screening  HPI:[name] is a 56 y.o. female who presents today for routine evaluation of her hypertension, back pain and health maintenance evaluation.  HTN: Patient's blood pressure is 136/76 today.  This represents a consistent trend with a previous value of 119/81.  I have encouraged her to continue weight loss and dietary modifications in an effort to decrease her pressure but feel that we will need to continue the losartan hydrochlorothiazide at this time. Plan: BMP today-Patient called and results discussed with the patient. No changes made.  Losartan hydrochlorothiazide 50/12.5 mg daily continued  Microcytic Anemia: Notable Hgb of 10.3 on 03/14/2016 without repeat labs since. Given her mild fatigue I will order a CBC to evaluate her for the microcytic anemia and if persistent with order Iron studies to better delineate vs starting empiric iron treatment.  CBC-Patient called and results discussed with the patient. Recommended Iron supplementation, ordered. Hgb stable.    Lumbar Pain: I feel this pain is possibly consistent with lumbar radiculopathy given her symptoms.  She however, has a symptoms only intermittently which are not associated with paresthesias of the perineal area nor any loss of bowel or bladder control.  The patient seeks evaluation but denies  desire for treatment as it is only intermittent but progressing. There is concern that given the involvement of her right knee, medially that this could potentially be related to her degenerative osteoarthritis of the right knee. I do not feel this to be a related issue as the pain radiates from the hip into the foot, is described as a shooting sensation and is only occassionally related to medial superficial right knee pain.  Plan: Given the length of her symptoms are greater than 6 weeks, failed improvement with ibuprofen as she is previously on, and third  radicular nature of the pain I will order lumbar x-rays to evaluate for potential vertebral compression versus degeneration.  Prediabetes: A1c ordered, increased to 6.0 today. I fear that diet and exercise alone would be sufficient but are not being performed as per the patient.  As such, I have strongly encouraged her to continue water aerobics given her chronic pain, gentle walking, stationary biking, reducing her caloric intake, and has suggested that she meet with our dietitian.  She continues to consistently defer being with a dietitian and believes that she is able to make these changes without additional assistance.  She is positively predisposed to the concept of water aerobics and will begin that shortly. Following the AVS printing: Patient was noted to have hypoglycemia to 63mg /dL during evaluation today. This was not associated with symptoms as she denied dizziness, lightheadedness, diaphoresis, chills, confusion. I feel this is related to her [employer] Relacore supplement and I can find no other clear potential cause of this mild asymptomatic hypoglycemia. Her glucose will be confirmed with a BMP. Her level rose to 77 after 93min and a serving of apple juice. I recommended that she stop taking the supplement and notify us if any of the preceding symptoms occurred at any time.   Health Maintenance: Schedule Colonoscopy? Patient stated that she is to see her OB/GYN who stated that they would perform this test. I find this to unlikely but she insists that they will be performing a stool test for colorectal cancer screening. I will reassess this following her appointment.    Past Medical History:  Diagnosis Date  . Arthritis   . Breast cancer ([place])   .  Hypertension   . Mental disorder   . Tenosynovitis of foot and ankle 09/28/2013   Review of Systems: ROS negative except as per HPI  Physical Exam:  Vitals:   07/15/17 1337  BP: 136/76  Pulse: 84  Temp: 98.5 F (36.9 C)  TempSrc: Oral    SpO2: 98%  Weight: 270 lb 12.8 oz (122.8 kg)  Height: 5\' 7"  (1.702 m)   Physical Exam  Constitutional: She is oriented to person, place, and time. She appears well-nourished. No distress.  HENT:  Head: Normocephalic and atraumatic.  Cardiovascular: Normal rate and regular rhythm.  No murmur heard. Pulmonary/Chest: Effort normal and breath sounds normal. No respiratory distress.  Musculoskeletal: She exhibits no edema or tenderness.  Bilateral positive straight leg raise test. The provider was able to illicit pain with minimal mobility of the patients legs bilaterally while lying supine. There was lumbar pain that radiated into the legs bilaterally.    Neurological: She is alert and oriented to person, place, and time.  Skin: Skin is warm. Capillary refill takes less than 2 seconds. She is not diaphoretic.  Psychiatric: She has a normal mood and affect.  Vitals reviewed.  Assessment & Plan:   See Encounters Tab for problem based charting.  Patient discussed with Dr. Angelia Mould

## 2017-07-16 LAB — BMP8+ANION GAP
ANION GAP: 12 mmol/L (ref 10.0–18.0)
BUN / CREAT RATIO: 18 (ref 9–23)
BUN: 13 mg/dL (ref 6–24)
CO2: 23 mmol/L (ref 20–29)
Calcium: 9.6 mg/dL (ref 8.7–10.2)
Chloride: 106 mmol/L (ref 96–106)
Creatinine, Ser: 0.74 mg/dL (ref 0.57–1.00)
GFR, EST AFRICAN AMERICAN: 105 mL/min/{1.73_m2} (ref >59)
GFR, EST NON AFRICAN AMERICAN: 91 mL/min/{1.73_m2} (ref >59)
Glucose: 70 mg/dL (ref 65–99)
POTASSIUM: 4 mmol/L (ref 3.5–5.2)
SODIUM: 141 mmol/L (ref 134–144)

## 2017-07-16 LAB — CBC
Hematocrit: 35.1 % (ref 34.0–46.6)
Hemoglobin: 11 g/dL — ABNORMAL LOW (ref 11.1–15.9)
MCH: 24.4 pg — AB (ref 26.6–33.0)
MCHC: 31.3 g/dL — AB (ref 31.5–35.7)
MCV: 78 fL — AB (ref 79–97)
Platelets: 289 10*3/uL (ref 150–450)
RBC: 4.51 x10E6/uL (ref 3.77–5.28)
RDW: 15.6 % — AB (ref 12.3–15.4)
WBC: 6.8 10*3/uL (ref 3.4–10.8)

## 2017-07-17 MED ORDER — FERROUS GLUCONATE 225 (27 FE) MG PO TABS: 240.0000 mg | ORAL_TABLET | Freq: Every day | ORAL | 1 refills | Status: AC

## 2017-07-17 MED ORDER — GABAPENTIN 400 MG PO CAPS: 800.0000 mg | ORAL_CAPSULE | Freq: Every day | ORAL | 1 refills | Status: DC

## 2017-07-17 NOTE — Progress Notes (Signed)
Internal Medicine Clinic Attending  Case discussed with Dr. Harbrecht at the time of the visit.  We reviewed the resident's history and exam and pertinent patient test results.  I agree with the assessment, diagnosis, and plan of care documented in the resident's note.   

## 2017-07-19 ENCOUNTER — Other Ambulatory Visit: Payer: Self-pay | Admitting: Internal Medicine

## 2017-07-30 ENCOUNTER — Ambulatory Visit (INDEPENDENT_AMBULATORY_CARE_PROVIDER_SITE_OTHER): Payer: PPO | Admitting: Family Medicine

## 2017-07-30 ENCOUNTER — Encounter: Payer: Self-pay | Admitting: Family Medicine

## 2017-07-30 VITALS — BP 120/74 | HR 75 | Ht 67.0 in | Wt 267.1 lb

## 2017-07-30 DIAGNOSIS — L918 Other hypertrophic disorders of the skin: Secondary | ICD-10-CM

## 2017-07-30 NOTE — Progress Notes (Signed)
Patient seen for irritated skin tag on left axilla. Rubs and becomes tender and irritated.  0.42mL of lidocaine with epi administered. Cleaned with alcohol and removed with scalpel. Bleed cauterized with silver nitrate. Band-aid placed on top.

## 2017-08-18 ENCOUNTER — Other Ambulatory Visit: Payer: Self-pay | Admitting: Internal Medicine

## 2017-08-23 ENCOUNTER — Other Ambulatory Visit: Payer: Self-pay | Admitting: Internal Medicine

## 2017-09-06 ENCOUNTER — Other Ambulatory Visit: Payer: Self-pay | Admitting: Internal Medicine

## 2017-09-07 ENCOUNTER — Other Ambulatory Visit: Payer: Self-pay | Admitting: Internal Medicine

## 2017-09-07 DIAGNOSIS — G2581 Restless legs syndrome: Secondary | ICD-10-CM

## 2017-09-07 DIAGNOSIS — M5416 Radiculopathy, lumbar region: Secondary | ICD-10-CM

## 2017-09-07 MED ORDER — CYCLOBENZAPRINE HCL 5 MG PO TABS
5.0000 mg | ORAL_TABLET | Freq: Every evening | ORAL | 3 refills | Status: AC | PRN
Start: 2017-09-07 — End: ?

## 2017-09-07 MED ORDER — GABAPENTIN 400 MG PO CAPS: 800.0000 mg | ORAL_CAPSULE | Freq: Every day | ORAL | 2 refills | Status: AC

## 2017-09-07 NOTE — Telephone Encounter (Signed)
Duloxetine and naproxen already pending in separate encounter. Hubbard Hartshorn, RN, BSN

## 2017-09-07 NOTE — Telephone Encounter (Signed)
Needs refill on   gabapentin (NEURONTIN) 400 MG capsule   DULoxetine (CYMBALTA) 30 MG capsule naproxen (NAPROSYN) 500 MG tablet cyclobenzaprine (FLEXERIL) 5 MG tablet @ [facility] [address] [place]; pt contact [phone]

## 2017-09-15 ENCOUNTER — Other Ambulatory Visit: Payer: Self-pay | Admitting: Internal Medicine

## 2017-09-17 ENCOUNTER — Telehealth: Payer: Self-pay | Admitting: Internal Medicine

## 2017-09-18 MED ORDER — RANITIDINE HCL 150 MG PO TABS: 150.0000 mg | ORAL_TABLET | Freq: Every day | ORAL | 1 refills | Status: DC

## 2017-09-18 NOTE — Addendum Note (Signed)
Addended by: Nicola Girt on: 09/18/2017 06:42 AM   Modules accepted: Orders

## 2017-10-14 ENCOUNTER — Encounter: Payer: Self-pay | Admitting: Internal Medicine

## 2017-10-14 ENCOUNTER — Ambulatory Visit (INDEPENDENT_AMBULATORY_CARE_PROVIDER_SITE_OTHER): Payer: PPO | Admitting: Internal Medicine

## 2017-10-14 VITALS — BP 126/64 | HR 94 | Temp 98.8°F | Wt 266.2 lb

## 2017-10-14 DIAGNOSIS — Z79899 Other long term (current) drug therapy: Secondary | ICD-10-CM

## 2017-10-14 DIAGNOSIS — G8929 Other chronic pain: Secondary | ICD-10-CM

## 2017-10-14 DIAGNOSIS — K219 Gastro-esophageal reflux disease without esophagitis: Secondary | ICD-10-CM

## 2017-10-14 DIAGNOSIS — M545 Low back pain, unspecified: Secondary | ICD-10-CM

## 2017-10-14 DIAGNOSIS — R7303 Prediabetes: Secondary | ICD-10-CM | POA: Diagnosis not present

## 2017-10-14 DIAGNOSIS — I10 Essential (primary) hypertension: Secondary | ICD-10-CM

## 2017-10-14 DIAGNOSIS — Z1211 Encounter for screening for malignant neoplasm of colon: Secondary | ICD-10-CM

## 2017-10-14 MED ORDER — PANTOPRAZOLE SODIUM 40 MG PO TBEC: 40.0000 mg | DELAYED_RELEASE_TABLET | Freq: Every day | ORAL | 3 refills | Status: DC

## 2017-10-14 NOTE — Patient Instructions (Signed)
FOLLOW-UP INSTRUCTIONS When: In 6 months For: Routine visit What to bring: All of your medications and lay them out during the appointment  Thank you for your visit to the [facility] today.  If you have any questions or concerns please feel free to let us know.   Please call your pharmacist and discuss your Zantac with them.

## 2017-10-14 NOTE — Progress Notes (Signed)
   CC: Routine visit for HTN and chronic pain  HPI:[name] is a 57 y.o. female who presents for routine evaluation. See problem based A/P for details.   GERD: Patient is instructed to contact her pharmacist to determine if her medication was involved in the recall Zantac to PPI due to recall concerns. We will discontinue the pantoprazole on her preference. Continue Zantac if unaffected.  Chronic back pain: Controlled with her current naproxen PRN and Cymbalta. No changes indicated today. She denied acute concerns.   HTN: 126/64. Within goal again today. Continue Hyzaar 50-12.5 daily  Healthcare maintenance: Patient has agreed to colon cancer screening referral to GI for consideration of an colonoscopy.  Prediabetes: A1c slightly increased to 6.0 from 5.8 on her last visit. We will repeat this every 6-8 months. We had a prolonged discussion regarding dietary modifications. It appears that the patient understands the need to cease use of sweet tea, sodas and starches to reduce her weight and risk for diabetes. She has initiated a form of intermittent fasting wherein she does not eat until noon. I have advised her that if she were to experience dizziness, weakness, confusion or sweating that she should consume a small quantity of juice 3-5oz and rest prior to calling our clinic. If these symptoms persist she is to visit the ER if a ride is available or call 911.   Past Medical History:  Diagnosis Date  . Arthritis   . Breast cancer ([place])   . Hypertension   . Mental disorder   . Tenosynovitis of foot and ankle 09/28/2013   Review of Systems:  ROS negative except as per HPI.  Physical Exam:  Vitals:   10/14/17 1405  BP: 126/64  Pulse: 94  Temp: 98.8 F (37.1 C)  TempSrc: Oral  SpO2: 99%  Weight: 266 lb 3.2 oz (120.7 kg)   General: A/Ox4, in no acute distress, afebrile, nondiaphoretic Cardio: RRR, no mrg's Pulm: CTA bilaterally, no wheezing MSK: bilateral lower  extremities nontender, nonedematous  Assessment & Plan:   See Encounters Tab for problem based charting.  Patient discussed with Dr. Evette Doffing

## 2017-10-15 ENCOUNTER — Encounter: Payer: Self-pay | Admitting: Internal Medicine

## 2017-10-15 ENCOUNTER — Telehealth: Payer: Self-pay | Admitting: *Deleted

## 2017-10-15 DIAGNOSIS — E7800 Pure hypercholesterolemia, unspecified: Secondary | ICD-10-CM | POA: Diagnosis not present

## 2017-10-15 DIAGNOSIS — R7303 Prediabetes: Secondary | ICD-10-CM | POA: Diagnosis not present

## 2017-10-15 DIAGNOSIS — I10 Essential (primary) hypertension: Secondary | ICD-10-CM | POA: Diagnosis not present

## 2017-10-15 DIAGNOSIS — K219 Gastro-esophageal reflux disease without esophagitis: Secondary | ICD-10-CM | POA: Diagnosis not present

## 2017-10-15 DIAGNOSIS — M549 Dorsalgia, unspecified: Secondary | ICD-10-CM

## 2017-10-15 DIAGNOSIS — G8929 Other chronic pain: Secondary | ICD-10-CM | POA: Insufficient documentation

## 2017-10-15 DIAGNOSIS — G4700 Insomnia, unspecified: Secondary | ICD-10-CM | POA: Diagnosis not present

## 2017-10-15 DIAGNOSIS — D509 Iron deficiency anemia, unspecified: Secondary | ICD-10-CM | POA: Diagnosis not present

## 2017-10-15 NOTE — Assessment & Plan Note (Signed)
Patient has agreed to colon cancer screening referral to GI for consideration of an colonoscopy.

## 2017-10-15 NOTE — Assessment & Plan Note (Signed)
GERD: Patient is instructed to contact her pharmacist to determine if her medication was involved in the recall Zantac to PPI due to recall concerns. We will discontinue the pantoprazole on her preference. Continue Zantac if unaffected.

## 2017-10-15 NOTE — Assessment & Plan Note (Signed)
Chronic back pain: Controlled with her current naproxen PRN and Cymbalta. No changes indicated today. She denied acute concerns.

## 2017-10-15 NOTE — Telephone Encounter (Signed)
CALLED PATIENT , UNABLE TO LEAVE VOICE MESSAGE/ NO VOICE MAIL. NEEDING TO KNOW AS TO WHERE PATIENT IS WANTING TO BE SEEN FOR GI.

## 2017-10-15 NOTE — Progress Notes (Signed)
Internal Medicine Clinic Attending  Case discussed with Dr. Harbrecht at the time of the visit.  We reviewed the resident's history and exam and pertinent patient test results.  I agree with the assessment, diagnosis, and plan of care documented in the resident's note.   

## 2017-10-15 NOTE — Assessment & Plan Note (Signed)
  HTN: 126/64. Within goal again today. Continue Hyzaar 50-12.5 daily

## 2017-10-15 NOTE — Assessment & Plan Note (Signed)
Prediabetes: A1c slightly increased to 6.0 from 5.8 on her last visit. We will repeat this every 6-8 months. We had a prolonged discussion regarding dietary modifications. It appears that the patient understands the need to cease use of sweet tea, sodas and starches to reduce her weight and risk for diabetes. She has initiated a form of intermittent fasting wherein she does not eat until noon. I have advised her that if she were to experience dizziness, weakness, confusion or sweating that she should consume a small quantity of juice 3-5oz and rest prior to calling our clinic. If these symptoms persist she is to visit the ER if a ride is available or call 911.

## 2017-10-16 ENCOUNTER — Encounter: Payer: Self-pay | Admitting: Gastroenterology

## 2017-10-17 ENCOUNTER — Other Ambulatory Visit: Payer: Self-pay | Admitting: Internal Medicine

## 2017-10-22 DIAGNOSIS — D649 Anemia, unspecified: Secondary | ICD-10-CM | POA: Diagnosis not present

## 2017-10-22 DIAGNOSIS — Z79899 Other long term (current) drug therapy: Secondary | ICD-10-CM | POA: Diagnosis not present

## 2017-11-16 ENCOUNTER — Other Ambulatory Visit: Payer: Self-pay | Admitting: Internal Medicine

## 2017-11-17 ENCOUNTER — Encounter: Payer: Self-pay | Admitting: Gastroenterology

## 2017-11-17 ENCOUNTER — Ambulatory Visit (AMBULATORY_SURGERY_CENTER): Payer: Self-pay

## 2017-11-17 VITALS — Ht 66.0 in | Wt 268.8 lb

## 2017-11-17 DIAGNOSIS — Z1211 Encounter for screening for malignant neoplasm of colon: Secondary | ICD-10-CM

## 2017-11-17 NOTE — Progress Notes (Signed)
Denies allergies to eggs or soy products. Denies complication of anesthesia or sedation. Denies use of weight loss medication. Denies use of O2.   Emmi instructions declined.  

## 2017-11-19 ENCOUNTER — Telehealth: Payer: Self-pay | Admitting: Internal Medicine

## 2017-11-19 NOTE — Telephone Encounter (Signed)
Pt missed a call unsure who call, pls callback [phone]

## 2017-11-25 DIAGNOSIS — F5104 Psychophysiologic insomnia: Secondary | ICD-10-CM | POA: Diagnosis not present

## 2017-11-25 DIAGNOSIS — G4730 Sleep apnea, unspecified: Secondary | ICD-10-CM | POA: Diagnosis not present

## 2017-11-30 DIAGNOSIS — I10 Essential (primary) hypertension: Secondary | ICD-10-CM | POA: Diagnosis not present

## 2017-11-30 DIAGNOSIS — D519 Vitamin B12 deficiency anemia, unspecified: Secondary | ICD-10-CM | POA: Diagnosis not present

## 2017-12-01 ENCOUNTER — Ambulatory Visit (AMBULATORY_SURGERY_CENTER): Payer: PPO | Admitting: Gastroenterology

## 2017-12-01 ENCOUNTER — Encounter: Payer: Self-pay | Admitting: Gastroenterology

## 2017-12-01 VITALS — BP 141/85 | HR 66 | Temp 97.5°F | Resp 15 | Ht 66.0 in | Wt 268.0 lb

## 2017-12-01 DIAGNOSIS — I10 Essential (primary) hypertension: Secondary | ICD-10-CM | POA: Diagnosis not present

## 2017-12-01 DIAGNOSIS — E119 Type 2 diabetes mellitus without complications: Secondary | ICD-10-CM | POA: Diagnosis not present

## 2017-12-01 DIAGNOSIS — Z6841 Body Mass Index (BMI) 40.0 and over, adult: Secondary | ICD-10-CM | POA: Diagnosis not present

## 2017-12-01 DIAGNOSIS — Z1211 Encounter for screening for malignant neoplasm of colon: Secondary | ICD-10-CM | POA: Diagnosis not present

## 2017-12-01 MED ORDER — SODIUM CHLORIDE 0.9 % IV SOLN: 500.0000 mL | Freq: Once | INTRAVENOUS | Status: DC

## 2017-12-01 NOTE — Progress Notes (Signed)
Report given to PACU, vss 

## 2017-12-01 NOTE — Op Note (Addendum)
[place] Patient Name: Maria Sims Procedure Date: 12/01/2017 9:51 AM MRN: [id number] Endoscopist: Remo Lipps P. Havery Moros , MD Age: 57 Referring MD:  Date of Birth: 06-18-1960 Gender: Female Account #: [id number] Procedure:                Colonoscopy Indications:              Screening for colorectal malignant neoplasm, This                            is the patient's first colonoscopy Medicines:                Monitored Anesthesia Care Procedure:                Pre-Anesthesia Assessment:                           - Prior to the procedure, a History and Physical                            was performed, and patient medications and                            allergies were reviewed. The patient's tolerance of                            previous anesthesia was also reviewed. The risks                            and benefits of the procedure and the sedation                            options and risks were discussed with the patient.                            All questions were answered, and informed consent                            was obtained. Prior Anticoagulants: The patient has                            taken no previous anticoagulant or antiplatelet                            agents. ASA Grade Assessment: III - A patient with                            severe systemic disease. After reviewing the risks                            and benefits, the patient was deemed in                            satisfactory condition to undergo the procedure.  After obtaining informed consent, the colonoscope                            was passed under direct vision. Throughout the                            procedure, the patient's blood pressure, pulse, and                            oxygen saturations were monitored continuously. The                            Model PCF-[id number]) scope was introduced                            through the  anus and advanced to the the cecum,                            identified by appendiceal orifice and ileocecal                            valve. The colonoscopy was performed without                            difficulty. The patient tolerated the procedure                            well. The quality of the bowel preparation was                            adequate. The ileocecal valve, appendiceal orifice,                            and rectum were photographed. Scope In: 10:01:07 AM Scope Out: 10:20:37 AM Scope Withdrawal Time: 0 hours 16 minutes 1 second  Total Procedure Duration: 0 hours 19 minutes 30 seconds  Findings:                 The perianal and digital rectal examinations were                            normal.                           Scattered medium-mouthed diverticula were found in                            the transverse colon, ascending colon and left                            colon.                           Anal papilla(e) were hypertrophied.  The exam was otherwise without abnormality. Complications:            No immediate complications. Estimated blood loss:                            Minimal. Estimated Blood Loss:     Estimated blood loss was minimal. Impression:               - Diverticulosis in the transverse colon, in the                            ascending colon and in the left colon.                           - Anal papilla(e) were hypertrophied.                           - The examination was otherwise normal. Several                            minutes spent lavaging the colon.                           - No polyps Recommendation:           - Patient has a contact number available for                            emergencies. The signs and symptoms of potential                            delayed complications were discussed with the                            patient. Return to normal activities tomorrow.                             Written discharge instructions were provided to the                            patient.                           - Resume previous diet.                           - Continue present medications.                           - Repeat colonoscopy in 10 years Maria Sims. Armbruster, MD 12/01/2017 10:24:03 AM This report has been signed electronically.

## 2017-12-01 NOTE — Patient Instructions (Addendum)
YOU HAD AN ENDOSCOPIC PROCEDURE TODAY AT [facility]:   Refer to the procedure report that was given to you for any specific questions about what was found during the examination.  If the procedure report does not answer your questions, please call your gastroenterologist to clarify.  If you requested that your care partner not be given the details of your procedure findings, then the procedure report has been included in a sealed envelope for you to review at your convenience later.  YOU SHOULD EXPECT: Some feelings of bloating in the abdomen. Passage of more gas than usual.  Walking can help get rid of the air that was put into your GI tract during the procedure and reduce the bloating. If you had a lower endoscopy (such as a colonoscopy or flexible sigmoidoscopy) you may notice spotting of blood in your stool or on the toilet paper. If you underwent a bowel prep for your procedure, you may not have a normal bowel movement for a few days.  Please Note:  You might notice some irritation and congestion in your nose or some drainage.  This is from the oxygen used during your procedure.  There is no need for concern and it should clear up in a day or so.  SYMPTOMS TO REPORT IMMEDIATELY:   Following lower endoscopy (colonoscopy or flexible sigmoidoscopy):  Excessive amounts of blood in the stool  Significant tenderness or worsening of abdominal pains  Swelling of the abdomen that is new, acute  Fever of 100F or higher   For urgent or emergent issues, a gastroenterologist can be reached at any hour by calling [phone].   DIET:  We do recommend a small meal at first, but then you may proceed to your regular diet.  Drink plenty of fluids but you should avoid alcoholic beverages for 24 hours.  ACTIVITY:  You should plan to take it easy for the rest of today and you should NOT DRIVE or use heavy machinery until tomorrow (because of the sedation medicines used during the test).     FOLLOW UP: Our staff will call the number listed on your records the next business day following your procedure to check on you and address any questions or concerns that you may have regarding the information given to you following your procedure. If we do not reach you, we will leave a message.  However, if you are feeling well and you are not experiencing any problems, there is no need to return our call.  We will assume that you have returned to your regular daily activities without incident.  If any biopsies were taken you will be contacted by phone or by letter within the next 1-3 weeks.  Please call us at [phone] if you have not heard about the biopsies in 3 weeks.    SIGNATURES/CONFIDENTIALITY: You and/or your care partner have signed paperwork which will be entered into your electronic medical record.  These signatures attest to the fact that that the information above on your After Visit Summary has been reviewed and is understood.  Full responsibility of the confidentiality of this discharge information lies with you and/or your care-partner.   Handout was given to your care partner on  Diverticulosis. Your blood sugar was 114 in the recovery room. You may resume your current medications today. Repeat colonoscopy in 10 years.. Please call if any questions or concerns.

## 2017-12-01 NOTE — Progress Notes (Signed)
No problems noted in the recovery room. maw 

## 2017-12-02 ENCOUNTER — Telehealth: Payer: Self-pay | Admitting: *Deleted

## 2017-12-02 NOTE — Telephone Encounter (Signed)
  Follow up Call-  Call back number 12/01/2017  Post procedure Call Back phone  # [phone]  Permission to leave phone message Yes  Some recent data might be hidden     Patient questions:  Do you have a fever, pain , or abdominal swelling? No. Pain Score  0 *  Have you tolerated food without any problems? Yes.    Have you been able to return to your normal activities? Yes.    Do you have any questions about your discharge instructions: Diet   No. Medications  Yes.   Follow up visit  No.  Do you have questions or concerns about your Care? No.  Actions: * If pain score is 4 or above: No action needed, pain <4.

## 2017-12-03 ENCOUNTER — Other Ambulatory Visit (HOSPITAL_COMMUNITY)
Admission: RE | Admit: 2017-12-03 | Discharge: 2017-12-03 | Disposition: A | Discharge status: Home / Self Care | Payer: PPO | Source: Ambulatory Visit | Attending: Family Medicine | Admitting: Family Medicine

## 2017-12-03 ENCOUNTER — Encounter: Payer: Self-pay | Admitting: Family Medicine

## 2017-12-03 ENCOUNTER — Ambulatory Visit (INDEPENDENT_AMBULATORY_CARE_PROVIDER_SITE_OTHER): Payer: PPO | Admitting: Family Medicine

## 2017-12-03 VITALS — BP 142/85 | HR 77 | Ht 66.0 in | Wt 264.0 lb

## 2017-12-03 DIAGNOSIS — Z124 Encounter for screening for malignant neoplasm of cervix: Secondary | ICD-10-CM | POA: Diagnosis not present

## 2017-12-03 DIAGNOSIS — R87810 Cervical high risk human papillomavirus (HPV) DNA test positive: Secondary | ICD-10-CM | POA: Insufficient documentation

## 2017-12-03 DIAGNOSIS — R87610 Atypical squamous cells of undetermined significance on cytologic smear of cervix (ASC-US): Secondary | ICD-10-CM | POA: Diagnosis not present

## 2017-12-03 NOTE — Addendum Note (Signed)
Addended by: Phill Myron on: 12/03/2017 11:18 AM   Modules accepted: Orders

## 2017-12-03 NOTE — Progress Notes (Signed)
   Subjective:    Patient ID: Maria Sims, female    DOB: 12-18-1960, 57 y.o.   MRN: [id number]  HPI Patient seen for abnormal PAP. Had Ascus + HPV. Colposcopy insufficient to evaluate. Planned on surveillance every 6 months. No other concerns   Review of Systems     Objective:   Physical Exam  Constitutional: She appears well-developed and well-nourished.  Abdominal: Hernia confirmed negative in the right inguinal area and confirmed negative in the left inguinal area.  Genitourinary: There is no rash, tenderness, lesion or injury on the right labia. There is no rash, tenderness, lesion or injury on the left labia. Cervix exhibits no motion tenderness, no discharge and no friability. No erythema, tenderness or bleeding in the vagina. No foreign body in the vagina. No signs of injury around the vagina. No vaginal discharge found.  Lymphadenopathy: No inguinal adenopathy noted on the right or left side.      Assessment & Plan:  1. 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

## 2017-12-08 ENCOUNTER — Telehealth: Payer: Self-pay

## 2017-12-08 ENCOUNTER — Encounter: Payer: Self-pay | Admitting: Family Medicine

## 2017-12-08 DIAGNOSIS — R87810 Cervical high risk human papillomavirus (HPV) DNA test positive: Secondary | ICD-10-CM

## 2017-12-08 DIAGNOSIS — R87610 Atypical squamous cells of undetermined significance on cytologic smear of cervix (ASC-US): Secondary | ICD-10-CM | POA: Insufficient documentation

## 2017-12-08 LAB — CYTOLOGY - PAP
ADEQUACY: ABSENT
Diagnosis: NEGATIVE
HPV (WINDOPATH): NOT DETECTED

## 2017-12-08 NOTE — Telephone Encounter (Signed)
Pt called the office back. Informed pt of normal PAP results. Advised pt to come in for a repeat PAP in six months. Understanding was voiced.

## 2017-12-08 NOTE — Telephone Encounter (Signed)
Called pt with pap results got vm. Left message for pt to call the office back.

## 2017-12-08 NOTE — Telephone Encounter (Signed)
-----   Message from Truett Mainland, DO sent at 12/08/2017 12:30 PM EST ----- Please let patient know that her PAP was normal. We should repeat this in 6 months.

## 2017-12-16 ENCOUNTER — Other Ambulatory Visit: Payer: Self-pay | Admitting: Internal Medicine

## 2017-12-16 DIAGNOSIS — M5416 Radiculopathy, lumbar region: Secondary | ICD-10-CM

## 2017-12-16 NOTE — Telephone Encounter (Signed)
Next appt scheduled 04/21/18 with PCP.

## 2017-12-29 DIAGNOSIS — R0683 Snoring: Secondary | ICD-10-CM | POA: Diagnosis not present

## 2017-12-29 DIAGNOSIS — G4712 Idiopathic hypersomnia without long sleep time: Secondary | ICD-10-CM | POA: Diagnosis not present

## 2017-12-29 DIAGNOSIS — F411 Generalized anxiety disorder: Secondary | ICD-10-CM | POA: Diagnosis not present

## 2017-12-29 DIAGNOSIS — G8929 Other chronic pain: Secondary | ICD-10-CM | POA: Diagnosis not present

## 2017-12-29 DIAGNOSIS — M79673 Pain in unspecified foot: Secondary | ICD-10-CM | POA: Diagnosis not present

## 2018-03-18 ENCOUNTER — Other Ambulatory Visit: Payer: Self-pay | Admitting: Physician Assistant

## 2018-03-18 DIAGNOSIS — Z1231 Encounter for screening mammogram for malignant neoplasm of breast: Secondary | ICD-10-CM

## 2018-04-02 ENCOUNTER — Other Ambulatory Visit: Payer: Self-pay | Admitting: Internal Medicine

## 2018-04-21 ENCOUNTER — Encounter: Payer: PPO | Admitting: Internal Medicine

## 2018-04-23 ENCOUNTER — Ambulatory Visit: Payer: PPO

## 2018-05-03 ENCOUNTER — Other Ambulatory Visit: Payer: Self-pay | Admitting: Internal Medicine

## 2018-05-03 NOTE — Telephone Encounter (Signed)
Patient established care with a new PCP last year at [facility]. Please. No additional refills.

## 2018-05-20 ENCOUNTER — Other Ambulatory Visit: Payer: Self-pay

## 2018-05-20 ENCOUNTER — Other Ambulatory Visit (HOSPITAL_COMMUNITY)
Admission: RE | Admit: 2018-05-20 | Discharge: 2018-05-20 | Disposition: A | Discharge status: Home / Self Care | Payer: Medicare Other | Source: Ambulatory Visit | Attending: Family Medicine | Admitting: Family Medicine

## 2018-05-20 ENCOUNTER — Encounter: Payer: Self-pay | Admitting: Family Medicine

## 2018-05-20 ENCOUNTER — Ambulatory Visit (INDEPENDENT_AMBULATORY_CARE_PROVIDER_SITE_OTHER): Payer: Medicare Other | Admitting: Family Medicine

## 2018-05-20 VITALS — BP 120/77 | HR 75 | Ht 66.0 in | Wt 263.1 lb

## 2018-05-20 DIAGNOSIS — R87810 Cervical high risk human papillomavirus (HPV) DNA test positive: Secondary | ICD-10-CM | POA: Diagnosis present

## 2018-05-20 DIAGNOSIS — R87610 Atypical squamous cells of undetermined significance on cytologic smear of cervix (ASC-US): Secondary | ICD-10-CM | POA: Insufficient documentation

## 2018-05-20 DIAGNOSIS — Z01419 Encounter for gynecological examination (general) (routine) without abnormal findings: Secondary | ICD-10-CM

## 2018-05-20 NOTE — Progress Notes (Signed)
GYNECOLOGY ANNUAL PREVENTATIVE CARE ENCOUNTER NOTE  Subjective:   Maria Sims is a 58 y.o. G37P0 female here for a routine annual gynecologic exam.  Current complaints: none.   Denies abnormal vaginal bleeding, discharge, pelvic pain, problems with intercourse or other gynecologic concerns.    Gynecologic History No LMP recorded. Patient is postmenopausal. Patient is sexually active  Contraception: post menopausal status Last Pap: 11/2017. Results were: normal Last mammogram: 2019. Results were: normal  Obstetric History OB History  Gravida Para Term Preterm AB Living  4         3  SAB TAB Ectopic Multiple Live Births          3    # Outcome Date GA Lbr Len/2nd Weight Sex Delivery Anes PTL Lv  4 Gravida      CS-Unspec     3 Gravida           2 Gravida           1 [place]             Past Medical History:  Diagnosis Date  . Anemia   . Anxiety   . Arthritis   . Breast cancer ([place])   . Depression   . Diabetes mellitus without complication ([place])   . GERD (gastroesophageal reflux disease)   . Hyperlipidemia   . Hypertension   . Mental disorder   . Neuromuscular disorder ([place])   . Plantar fasciitis of right foot 09/13/2015  . Sleep apnea   . Tenosynovitis of foot and ankle 09/28/2013    Past Surgical History:  Procedure Laterality Date  . ABDOMINAL HYSTERECTOMY    . BREAST LUMPECTOMY Right    32 years ago   . BREAST RECONSTRUCTION    . BUNIONECTOMY Left 01/26/2014   @ [facility]  . CARPAL TUNNEL RELEASE  2018  . CHOLECYSTECTOMY    . COTTON OSTEOTOMY W/ BONE GRAFT Left 01/26/2014   @ [place] [facility]  . KNEE SURGERY    . LEFT HEART CATH AND CORONARY ANGIOGRAPHY N/A 03/20/2016   Procedure: Left Heart Cath and Coronary Angiography;  Surgeon: Peter M Martinique, MD;  Location: [facility];  Service: Cardiovascular;  Laterality: N/A;  . NECK SURGERY    . REDUCTION MAMMAPLASTY Bilateral     Current Outpatient Medications on File Prior to Visit  Medication  Sig Dispense Refill  . busPIRone (BUSPAR) 15 MG tablet Take 15 mg by mouth 3 (three) times daily.    . cyclobenzaprine (FLEXERIL) 5 MG tablet Take 1 tablet (5 mg total) by mouth at bedtime as needed for muscle spasms. 30 tablet 3  . ferrous gluconate (FERGON) 225 (27 Fe) MG tablet Take 1 tablet (240 mg total) by mouth daily. 90 each 1  . gabapentin (NEURONTIN) 400 MG capsule Take 2 capsules (800 mg total) by mouth at bedtime. 60 capsule 2  . losartan-hydrochlorothiazide (HYZAAR) 50-12.5 MG tablet TAKE 1 TABLET BY MOUTH DAILY 90 tablet 3  . metFORMIN (GLUCOPHAGE) 500 MG tablet Take 500 mg by mouth 2 (two) times daily with a meal.    . naproxen (NAPROSYN) 500 MG tablet TAKE 1 TABLET(500 MG) BY MOUTH DAILY AS NEEDED FOR MODERATE PAIN 30 tablet 0  . OVER THE COUNTER MEDICATION Vitamin B 12, 3000 mcg once a day.    . pravastatin (PRAVACHOL) 40 MG tablet Take 1 tablet (40 mg total) by mouth daily. 30 tablet 11  . DULoxetine (CYMBALTA) 30 MG capsule TAKE 2 CAPSULES  BY MOUTH DAILY (Patient not taking: Reported on 05/20/2018) 180 capsule 3  . nitroGLYCERIN (NITROSTAT) 0.4 MG SL tablet Place 1 tablet (0.4 mg total) under the tongue every 5 (five) minutes as needed for chest pain. 100 tablet 3   No current facility-administered medications on file prior to visit.     Allergies  Allergen Reactions  . Pineapple Anaphylaxis and Rash  . Latex Itching and Swelling    redness    Social History   Socioeconomic History  . Marital status: Single    Spouse name: Not on file  . Number of children: Not on file  . Years of education: Not on file  . Highest education level: Not on file  Occupational History  . Not on file  Social Needs  . Financial resource strain: Not on file  . Food insecurity:    Worry: Not on file    Inability: Not on file  . Transportation needs:    Medical: Not on file    Non-medical: Not on file  Tobacco Use  . Smoking status: Never Smoker  . Smokeless tobacco: Never Used   Substance and Sexual Activity  . Alcohol use: Yes    Alcohol/week: 0.0 standard drinks    Comment: Socially.  . Drug use: No  . Sexual activity: Not on file  Lifestyle  . Physical activity:    Days per week: Not on file    Minutes per session: Not on file  . Stress: Not on file  Relationships  . Social connections:    Talks on phone: Not on file    Gets together: Not on file    Attends religious service: Not on file    Active member of club or organization: Not on file    Attends meetings of clubs or organizations: Not on file    Relationship status: Not on file  . Intimate partner violence:    Fear of current or ex partner: Not on file    Emotionally abused: Not on file    Physically abused: Not on file    Forced sexual activity: Not on file  Other Topics Concern  . Not on file  Social History Narrative  . Not on file    Family History  Problem Relation Age of Onset  . Hypertension Mother   . Diabetes Father   . Colon cancer Neg Hx   . Esophageal cancer Neg Hx   . Rectal cancer Neg Hx   . Stomach cancer Neg Hx     The following portions of the patient's history were reviewed and updated as appropriate: allergies, current medications, past family history, past medical history, past social history, past surgical history and problem list.  Review of Systems Pertinent items are noted in HPI.   Objective:  BP 120/77   Pulse 75   Ht 5\' 6"  (1.676 m)   Wt 263 lb 1.3 oz (119.3 kg)   BMI 42.46 kg/m  Wt Readings from Last 3 Encounters:  05/20/18 263 lb 1.3 oz (119.3 kg)  12/03/17 264 lb (119.7 kg)  12/01/17 268 lb (121.6 kg)     CONSTITUTIONAL: Well-developed, well-nourished female in no acute distress.  HENT:  Normocephalic, atraumatic, External right and left ear normal. Oropharynx is clear and moist EYES: Conjunctivae and EOM are normal. Pupils are equal, round, and reactive to light. No scleral icterus.  NECK: Normal range of motion, supple, no masses.  Normal  thyroid.   CARDIOVASCULAR: Normal heart rate noted, regular rhythm RESPIRATORY:  Clear to auscultation bilaterally. Effort and breath sounds normal, no problems with respiration noted. BREASTS: Symmetric in size. No masses, skin changes, nipple drainage, or lymphadenopathy. ABDOMEN: Soft, normal bowel sounds, no distention noted.  No tenderness, rebound or guarding.  PELVIC: Normal appearing external genitalia; normal appearing vaginal mucosa and cervix.  No abnormal discharge noted.  Normal uterine size, no other palpable masses, no uterine or adnexal tenderness. MUSCULOSKELETAL: Normal range of motion. No tenderness.  No cyanosis, clubbing, or edema.  2+ distal pulses. SKIN: Skin is warm and dry. No rash noted. Not diaphoretic. No erythema. No pallor. NEUROLOGIC: Alert and oriented to person, place, and time. Normal reflexes, muscle tone coordination. No cranial nerve deficit noted. PSYCHIATRIC: Normal mood and affect. Normal behavior. Normal judgment and thought content.  Assessment:  Annual gynecologic examination with pap smear   Plan:  1. Well Woman Exam Will follow up results of pap smear and manage accordingly. Mammogram scheduled  2. ASCUS with positive high risk HPV cervical Last PAP 6 months ago. Negative. Repeat today.   Routine preventative health maintenance measures emphasized. Please refer to After Visit Summary for other counseling recommendations.    [place], [place] for [employer]

## 2018-05-24 LAB — CYTOLOGY - PAP
Diagnosis: NEGATIVE
HPV: NOT DETECTED

## 2018-06-02 ENCOUNTER — Other Ambulatory Visit: Payer: Self-pay

## 2018-06-02 ENCOUNTER — Ambulatory Visit
Admission: RE | Admit: 2018-06-02 | Discharge: 2018-06-02 | Disposition: A | Discharge status: Home / Self Care | Payer: Medicare Other | Source: Ambulatory Visit | Attending: Physician Assistant | Admitting: Physician Assistant

## 2018-06-02 ENCOUNTER — Encounter: Payer: PPO | Admitting: Internal Medicine

## 2018-06-02 DIAGNOSIS — Z1231 Encounter for screening mammogram for malignant neoplasm of breast: Secondary | ICD-10-CM

## 2018-06-08 ENCOUNTER — Ambulatory Visit: Payer: PPO

## 2019-04-06 IMAGING — CR DG LUMBAR SPINE COMPLETE 4+V
5 series · 5 of 5 positions shown · non-contrast
Comparison: None

CLINICAL DATA: Persistent lumbago with radiculopathy, low back pain
radiating up lower back and down both legs for 2 years

EXAM:
LUMBAR SPINE - COMPLETE 4+ VIEW

[l-spine ap]
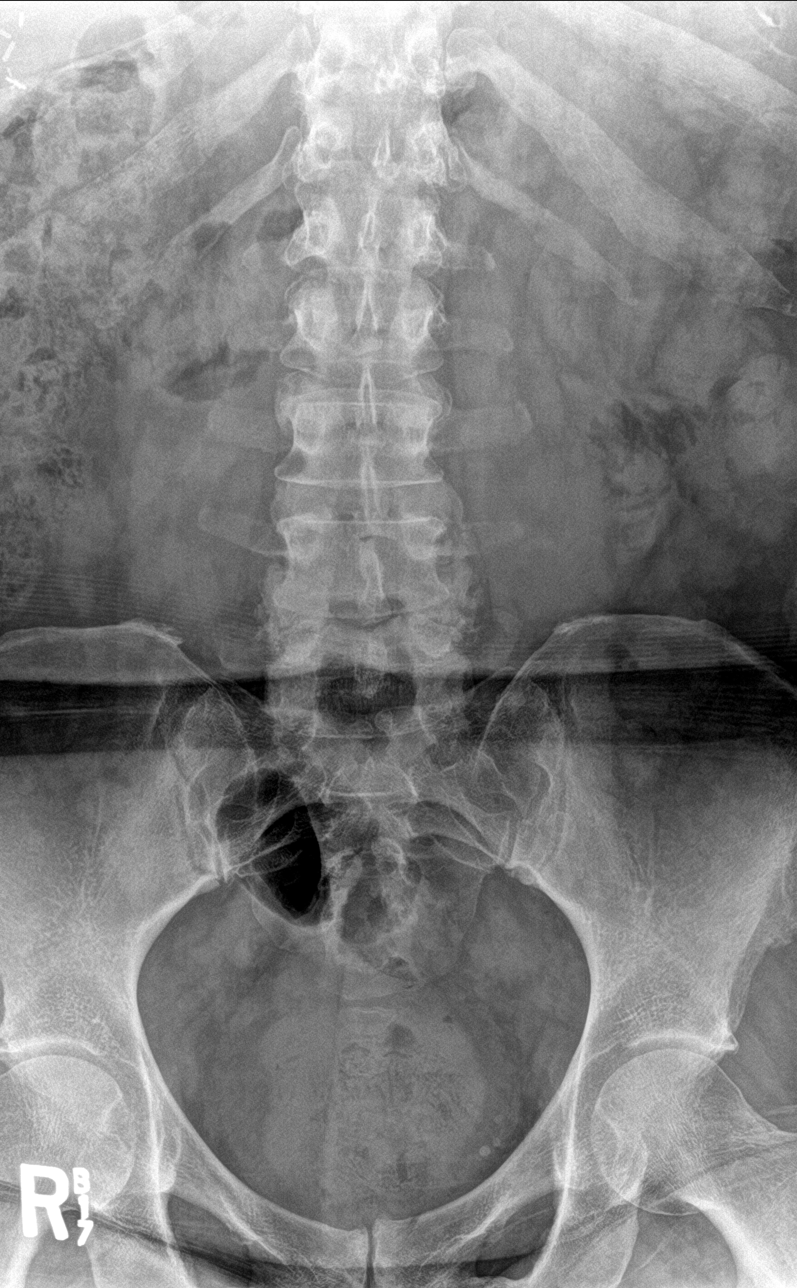

[l-spine obl (1 of 2)]
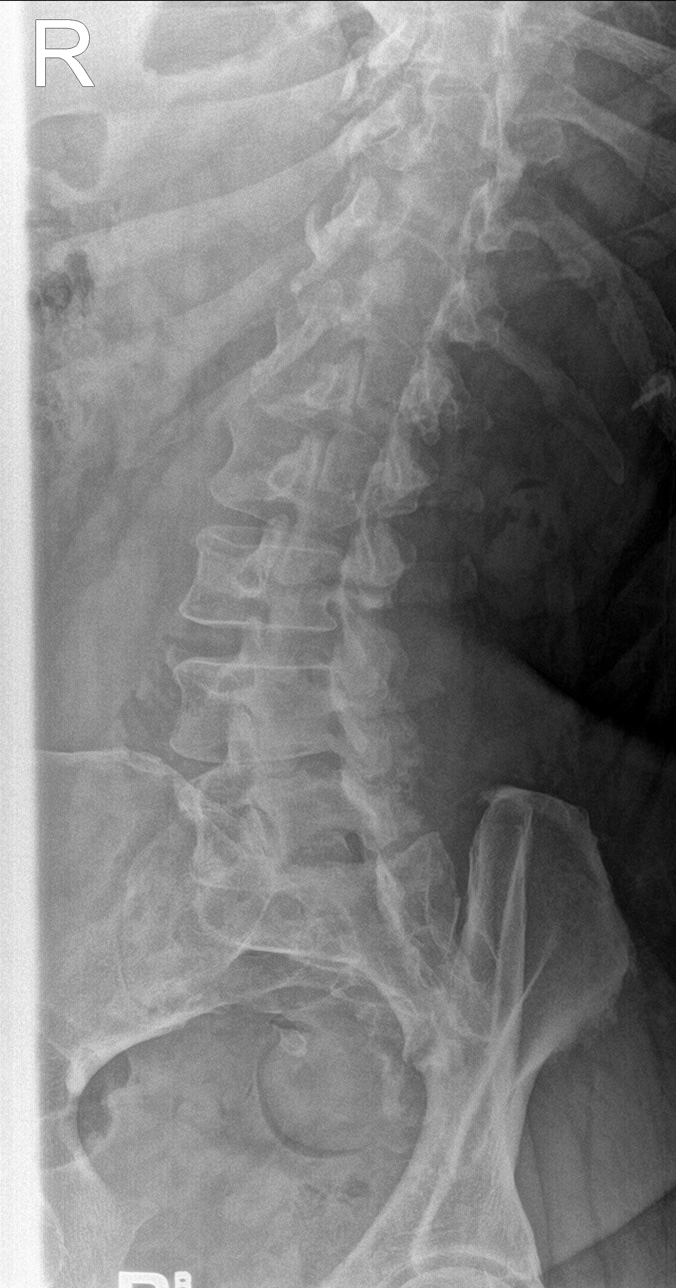

[l-spine obl (2 of 2)]
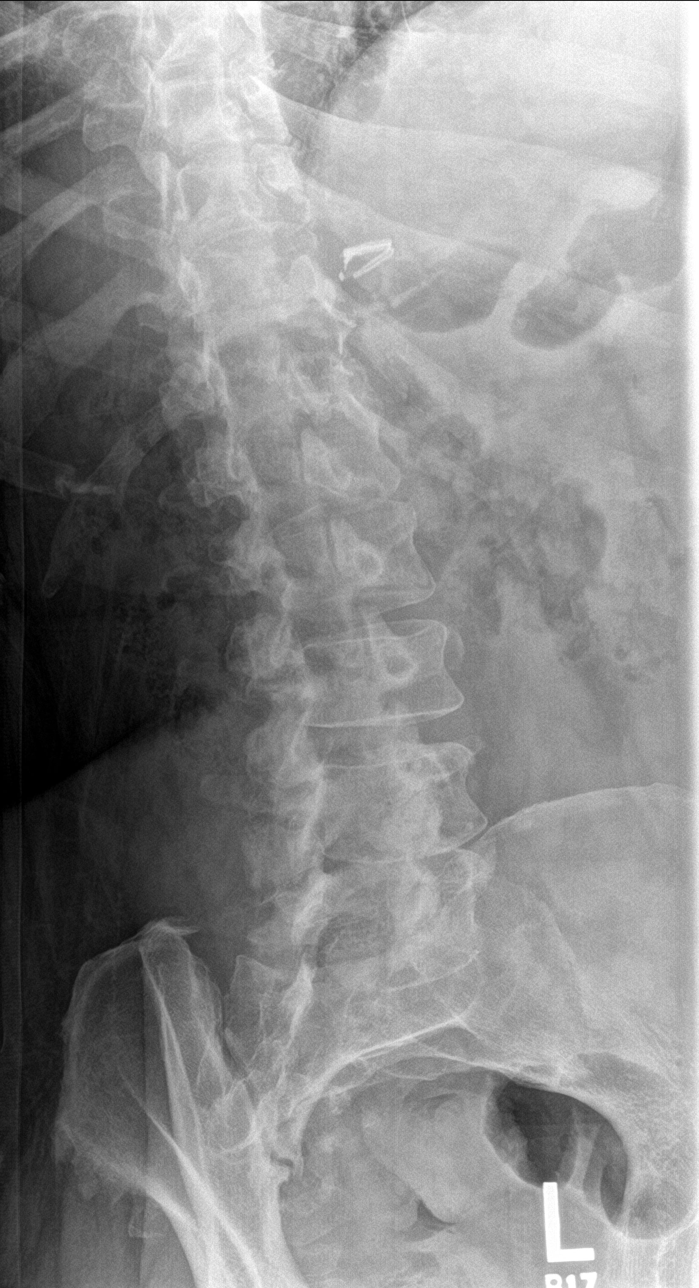

[l-spine lat]
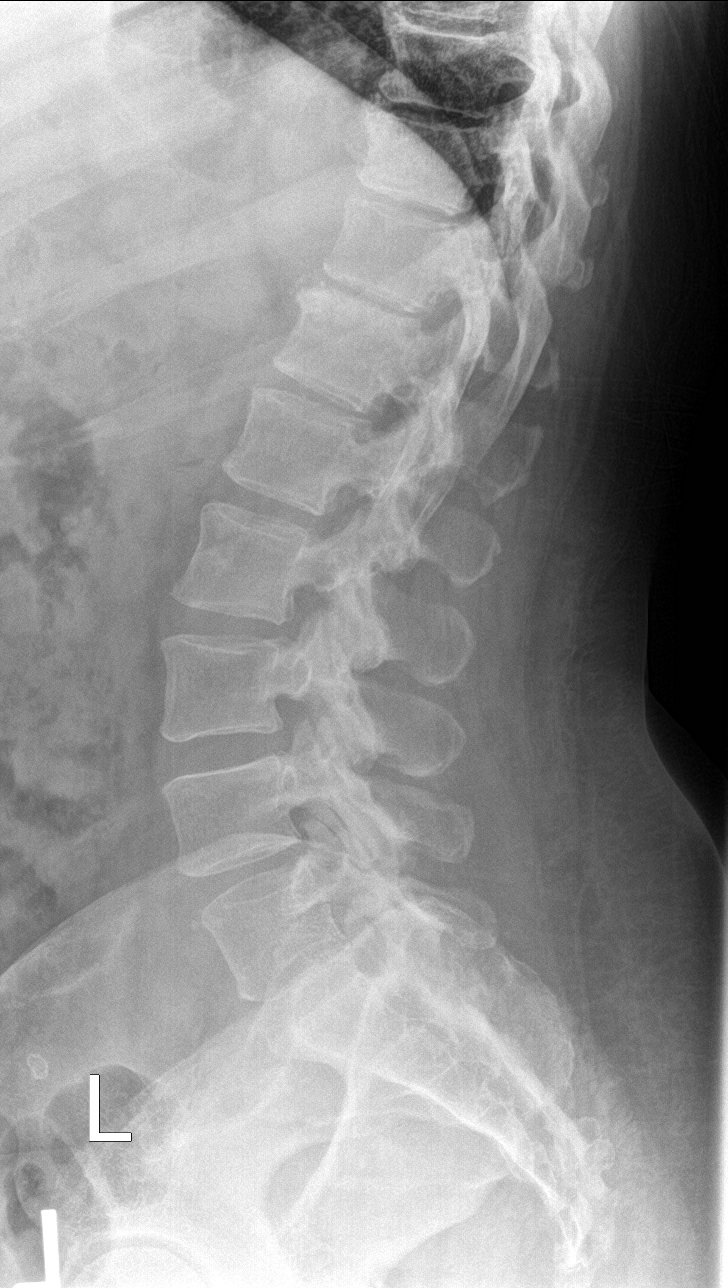

[l-spine spot]
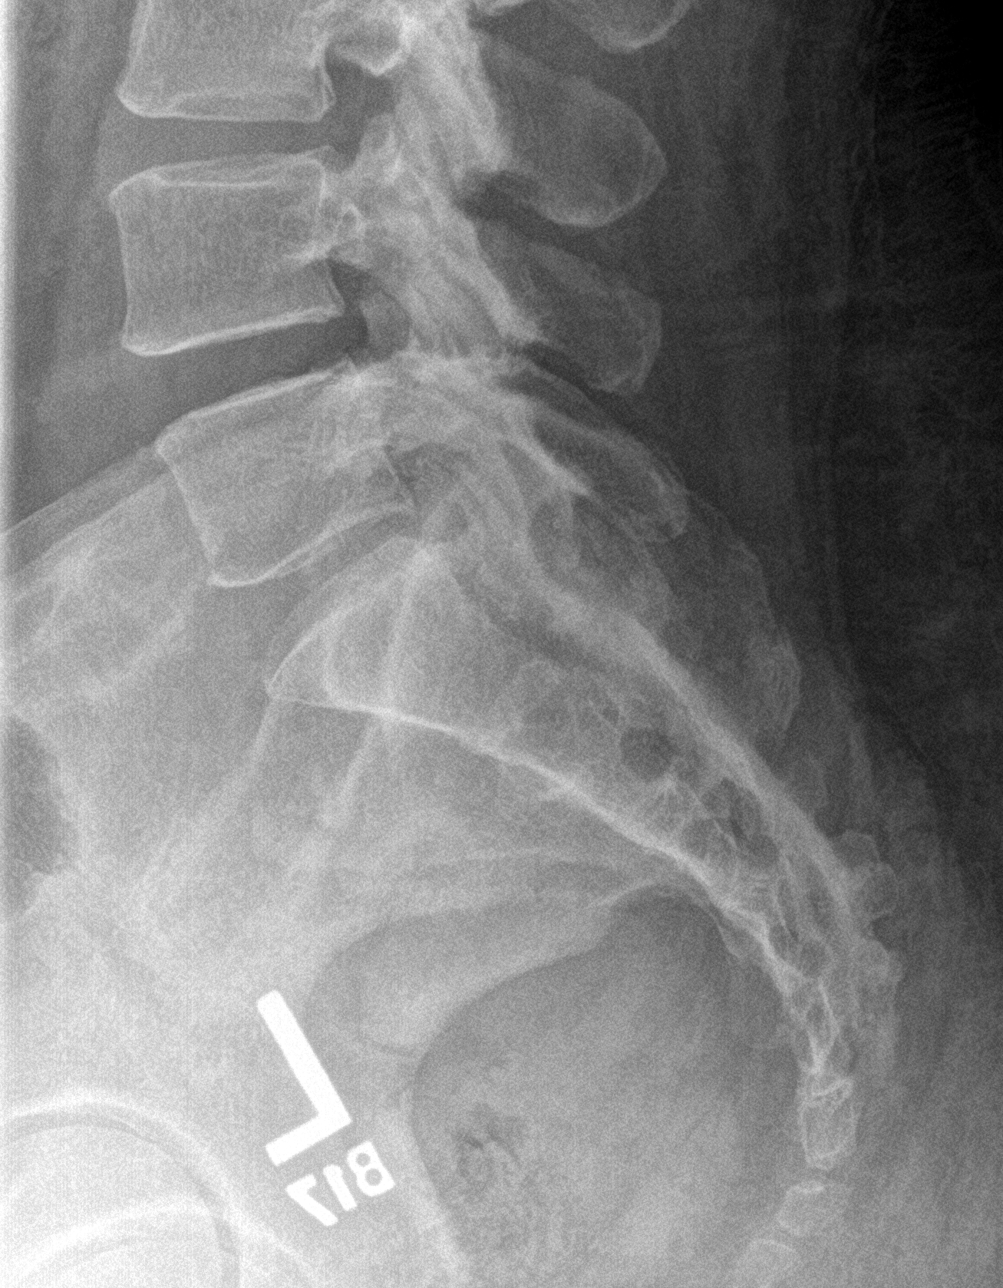

[5 of 5 positions shown; findings below may reference images not displayed]

FINDINGS: 5 non-rib-bearing lumbar vertebra.

Osseous mineralization grossly normal for technique.

Scattered facet degenerative changes lower lumbar spine.

Vertebral body heights maintained without fracture or subluxation.

No bone destruction or spondylolysis.

Mild disc space narrowing and tiny endplate spurs at T11-T12.

SI joints preserved.
IMPRESSION: Mild degenerative disc and facet disease changes as above.

No acute abnormalities.

## 2019-04-25 ENCOUNTER — Other Ambulatory Visit: Payer: Self-pay | Admitting: Physician Assistant

## 2019-04-25 DIAGNOSIS — Z1231 Encounter for screening mammogram for malignant neoplasm of breast: Secondary | ICD-10-CM

## 2019-05-26 ENCOUNTER — Ambulatory Visit: Payer: Medicare Other | Admitting: Family Medicine

## 2019-06-02 ENCOUNTER — Ambulatory Visit (INDEPENDENT_AMBULATORY_CARE_PROVIDER_SITE_OTHER): Payer: Medicare Other | Admitting: Family Medicine

## 2019-06-02 ENCOUNTER — Other Ambulatory Visit: Payer: Self-pay

## 2019-06-02 ENCOUNTER — Other Ambulatory Visit (HOSPITAL_COMMUNITY)
Admission: RE | Admit: 2019-06-02 | Discharge: 2019-06-02 | Disposition: A | Discharge status: Home / Self Care | Payer: Medicare Other | Source: Ambulatory Visit | Attending: Family Medicine | Admitting: Family Medicine

## 2019-06-02 VITALS — BP 120/73 | HR 83 | Ht 65.0 in | Wt 271.0 lb

## 2019-06-02 DIAGNOSIS — Z1151 Encounter for screening for human papillomavirus (HPV): Secondary | ICD-10-CM | POA: Insufficient documentation

## 2019-06-02 DIAGNOSIS — Z01419 Encounter for gynecological examination (general) (routine) without abnormal findings: Secondary | ICD-10-CM | POA: Diagnosis present

## 2019-06-02 DIAGNOSIS — Z780 Asymptomatic menopausal state: Secondary | ICD-10-CM | POA: Diagnosis not present

## 2019-06-02 NOTE — Progress Notes (Signed)
GYNECOLOGY ANNUAL PREVENTATIVE CARE ENCOUNTER NOTE  Subjective:   Maria Sims is a 59 y.o. G14P0 female here for a routine annual gynecologic exam.  Current complaints: none.   Denies abnormal vaginal bleeding, discharge, pelvic pain, problems with intercourse or other gynecologic concerns.    Gynecologic History No LMP recorded. Patient is postmenopausal. Contraception: post menopausal status Last Pap: 2019. Results were: ASCUS Last mammogram: 06/02/2018. Results were: normal  Obstetric History OB History  Gravida Para Term Preterm AB Living  4         3  SAB TAB Ectopic Multiple Live Births          3    # Outcome Date GA Lbr Len/2nd Weight Sex Delivery Anes PTL Lv  4 Gravida      CS-Unspec     3 Gravida           2 Gravida           1 [place]             Past Medical History:  Diagnosis Date  . Anemia   . Anxiety   . Arthritis   . Breast cancer ([place])   . Depression   . Diabetes mellitus without complication ([place])   . GERD (gastroesophageal reflux disease)   . Hyperlipidemia   . Hypertension   . Mental disorder   . Neuromuscular disorder ([place])   . Plantar fasciitis of right foot 09/13/2015  . Sleep apnea   . Tenosynovitis of foot and ankle 09/28/2013    Past Surgical History:  Procedure Laterality Date  . ABDOMINAL HYSTERECTOMY    . BREAST LUMPECTOMY Right    32 years ago   . BREAST RECONSTRUCTION    . BUNIONECTOMY Left 01/26/2014   @ [facility]  . CARPAL TUNNEL RELEASE  2018  . CHOLECYSTECTOMY    . COTTON OSTEOTOMY W/ BONE GRAFT Left 01/26/2014   @ [place]  . KNEE SURGERY    . LEFT HEART CATH AND CORONARY ANGIOGRAPHY N/A 03/20/2016   Procedure: Left Heart Cath and Coronary Angiography;  Surgeon: Peter M Martinique, MD;  Location: [place] [facility];  Service: Cardiovascular;  Laterality: N/A;  . NECK SURGERY    . REDUCTION MAMMAPLASTY Bilateral     Current Outpatient Medications on File Prior to Visit  Medication Sig Dispense Refill  .  busPIRone (BUSPAR) 15 MG tablet Take 15 mg by mouth 3 (three) times daily.    . cyclobenzaprine (FLEXERIL) 5 MG tablet Take 1 tablet (5 mg total) by mouth at bedtime as needed for muscle spasms. 30 tablet 3  . DULoxetine (CYMBALTA) 30 MG capsule TAKE 2 CAPSULES BY MOUTH DAILY (Patient not taking: Reported on 05/20/2018) 180 capsule 3  . ferrous gluconate (FERGON) 225 (27 Fe) MG tablet Take 1 tablet (240 mg total) by mouth daily. 90 each 1  . gabapentin (NEURONTIN) 400 MG capsule Take 2 capsules (800 mg total) by mouth at bedtime. 60 capsule 2  . losartan-hydrochlorothiazide (HYZAAR) 50-12.5 MG tablet TAKE 1 TABLET BY MOUTH DAILY 90 tablet 3  . metFORMIN (GLUCOPHAGE) 500 MG tablet Take 500 mg by mouth 2 (two) times daily with a meal.    . naproxen (NAPROSYN) 500 MG tablet TAKE 1 TABLET(500 MG) BY MOUTH DAILY AS NEEDED FOR MODERATE PAIN 30 tablet 0  . nitroGLYCERIN (NITROSTAT) 0.4 MG SL tablet Place 1 tablet (0.4 mg total) under the tongue every 5 (five) minutes as needed for chest pain. 100 tablet  3  . OVER THE COUNTER MEDICATION Vitamin B 12, 3000 mcg once a day.    . pravastatin (PRAVACHOL) 40 MG tablet Take 1 tablet (40 mg total) by mouth daily. 30 tablet 11   No current facility-administered medications on file prior to visit.    Allergies  Allergen Reactions  . Pineapple Anaphylaxis and Rash  . Latex Itching and Swelling    redness    Social History   Socioeconomic History  . Marital status: Single    Spouse name: Not on file  . Number of children: Not on file  . Years of education: Not on file  . Highest education level: Not on file  Occupational History  . Not on file  Tobacco Use  . Smoking status: Never Smoker  . Smokeless tobacco: Never Used  Substance and Sexual Activity  . Alcohol use: Yes    Alcohol/week: 0.0 standard drinks    Comment: Socially.  . Drug use: No  . Sexual activity: Not on file  Other Topics Concern  . Not on file  Social History Narrative  . Not  on file   Social Determinants of Health   Financial Resource Strain:   . Difficulty of Paying Living Expenses:   Food Insecurity:   . Worried About Charity fundraiser in the Last Year:   . Arboriculturist in the Last Year:   Transportation Needs:   . Film/video editor (Medical):   Marland Kitchen Lack of Transportation (Non-Medical):   Physical Activity:   . Days of Exercise per Week:   . Minutes of Exercise per Session:   Stress:   . Feeling of Stress :   Social Connections:   . Frequency of Communication with Friends and Family:   . Frequency of Social Gatherings with Friends and Family:   . Attends Religious Services:   . Active Member of Clubs or Organizations:   . Attends Archivist Meetings:   Marland Kitchen Marital Status:   Intimate Partner Violence:   . Fear of Current or Ex-Partner:   . Emotionally Abused:   Marland Kitchen Physically Abused:   . Sexually Abused:     Family History  Problem Relation Age of Onset  . Hypertension Mother   . Diabetes Father   . Colon cancer Neg Hx   . Esophageal cancer Neg Hx   . Rectal cancer Neg Hx   . Stomach cancer Neg Hx     The following portions of the patient's history were reviewed and updated as appropriate: allergies, current medications, past family history, past medical history, past social history, past surgical history and problem list.  Review of Systems Pertinent items are noted in HPI.   Objective:  BP 120/73   Pulse 83   Ht 5\' 5"  (1.651 m)   Wt 271 lb (122.9 kg)   BMI 45.10 kg/m  Wt Readings from Last 3 Encounters:  06/02/19 271 lb (122.9 kg)  05/20/18 263 lb 1.3 oz (119.3 kg)  12/03/17 264 lb (119.7 kg)     Chaperone present during exam  CONSTITUTIONAL: Well-developed, well-nourished female in no acute distress.  HENT:  Normocephalic, atraumatic, External right and left ear normal. Oropharynx is clear and moist EYES: Conjunctivae and EOM are normal. Pupils are equal, round, and reactive to light. No scleral icterus.   NECK: Normal range of motion, supple, no masses.  Normal thyroid.   CARDIOVASCULAR: Normal heart rate noted, regular rhythm RESPIRATORY: Clear to auscultation bilaterally. Effort and breath sounds normal,  no problems with respiration noted. BREASTS: S/p biopsy, breast surgery. Symmetric in size. No masses, skin changes, nipple drainage, or lymphadenopathy. ABDOMEN: Soft, normal bowel sounds, no distention noted.  No tenderness, rebound or guarding.  PELVIC: Normal appearing external genitalia; normal appearing vaginal mucosa and cervix.  No abnormal discharge noted.  Normal uterine size, no other palpable masses, no uterine or adnexal tenderness. MUSCULOSKELETAL: Normal range of motion. No tenderness.  No cyanosis, clubbing, or edema.  2+ distal pulses. SKIN: Skin is warm and dry. No rash noted. Not diaphoretic. No erythema. No pallor. NEUROLOGIC: Alert and oriented to person, place, and time. Normal reflexes, muscle tone coordination. No cranial nerve deficit noted. PSYCHIATRIC: Normal mood and affect. Normal behavior. Normal judgment and thought content.  Assessment:  Annual gynecologic examination with pap smear   Plan:  1. Well Woman Exam Will follow up results of pap smear and manage accordingly. Mammogram scheduled  - Cytology - PAP( [place])   Routine preventative health maintenance measures emphasized. Please refer to After Visit Summary for other counseling recommendations.    Loma Boston, [place] for [employer]

## 2019-06-02 NOTE — Progress Notes (Signed)
GYNECOLOGY ANNUAL PREVENTATIVE CARE ENCOUNTER NOTE  Subjective:   Maria Sims is a 59 y.o. G62P0 female here for a routine annual gynecologic exam.  Current complaints: none.   Denies abnormal vaginal bleeding, discharge, pelvic pain, problems with intercourse or other gynecologic concerns.    Gynecologic History No LMP recorded. Patient is postmenopausal. Patient is sexually active  Contraception: post menopausal status Last Pap: 05/20/18. Results were: normal Last mammogram: 06/02/18. Results were: normal  Obstetric History OB History  Gravida Para Term Preterm AB Living  4         3  SAB TAB Ectopic Multiple Live Births          3    # Outcome Date GA Lbr Len/2nd Weight Sex Delivery Anes PTL Lv  4 Gravida      CS-Unspec     3 Gravida           2 Gravida           1 [place]             Past Medical History:  Diagnosis Date  . Anemia   . Anxiety   . Arthritis   . Breast cancer ([place])   . Depression   . Diabetes mellitus without complication ([place])   . GERD (gastroesophageal reflux disease)   . Hyperlipidemia   . Hypertension   . Mental disorder   . Neuromuscular disorder ([place])   . Plantar fasciitis of right foot 09/13/2015  . Sleep apnea   . Tenosynovitis of foot and ankle 09/28/2013    Past Surgical History:  Procedure Laterality Date  . ABDOMINAL HYSTERECTOMY    . BREAST LUMPECTOMY Right    32 years ago   . BREAST RECONSTRUCTION    . BUNIONECTOMY Left 01/26/2014   @ [facility]  . CARPAL TUNNEL RELEASE  2018  . CHOLECYSTECTOMY    . COTTON OSTEOTOMY W/ BONE GRAFT Left 01/26/2014   @ [place]  . KNEE SURGERY    . LEFT HEART CATH AND CORONARY ANGIOGRAPHY N/A 03/20/2016   Procedure: Left Heart Cath and Coronary Angiography;  Surgeon: Peter M Martinique, MD;  Location: [place] [facility];  Service: Cardiovascular;  Laterality: N/A;  . NECK SURGERY    . REDUCTION MAMMAPLASTY Bilateral     Current Outpatient Medications on File Prior to Visit   Medication Sig Dispense Refill  . busPIRone (BUSPAR) 15 MG tablet Take 15 mg by mouth 3 (three) times daily.    . cyclobenzaprine (FLEXERIL) 5 MG tablet Take 1 tablet (5 mg total) by mouth at bedtime as needed for muscle spasms. 30 tablet 3  . DULoxetine (CYMBALTA) 30 MG capsule TAKE 2 CAPSULES BY MOUTH DAILY (Patient not taking: Reported on 05/20/2018) 180 capsule 3  . ferrous gluconate (FERGON) 225 (27 Fe) MG tablet Take 1 tablet (240 mg total) by mouth daily. 90 each 1  . gabapentin (NEURONTIN) 400 MG capsule Take 2 capsules (800 mg total) by mouth at bedtime. 60 capsule 2  . losartan-hydrochlorothiazide (HYZAAR) 50-12.5 MG tablet TAKE 1 TABLET BY MOUTH DAILY 90 tablet 3  . metFORMIN (GLUCOPHAGE) 500 MG tablet Take 500 mg by mouth 2 (two) times daily with a meal.    . naproxen (NAPROSYN) 500 MG tablet TAKE 1 TABLET(500 MG) BY MOUTH DAILY AS NEEDED FOR MODERATE PAIN 30 tablet 0  . nitroGLYCERIN (NITROSTAT) 0.4 MG SL tablet Place 1 tablet (0.4 mg total) under the tongue every 5 (five) minutes as needed  for chest pain. 100 tablet 3  . OVER THE COUNTER MEDICATION Vitamin B 12, 3000 mcg once a day.    . pravastatin (PRAVACHOL) 40 MG tablet Take 1 tablet (40 mg total) by mouth daily. 30 tablet 11   No current facility-administered medications on file prior to visit.    Allergies  Allergen Reactions  . Pineapple Anaphylaxis and Rash  . Latex Itching and Swelling    redness    Social History   Socioeconomic History  . Marital status: Single    Spouse name: Not on file  . Number of children: Not on file  . Years of education: Not on file  . Highest education level: Not on file  Occupational History  . Not on file  Tobacco Use  . Smoking status: Never Smoker  . Smokeless tobacco: Never Used  Substance and Sexual Activity  . Alcohol use: Yes    Alcohol/week: 0.0 standard drinks    Comment: Socially.  . Drug use: No  . Sexual activity: Not on file  Other Topics Concern  . Not on file   Social History Narrative  . Not on file   Social Determinants of Health   Financial Resource Strain:   . Difficulty of Paying Living Expenses:   Food Insecurity:   . Worried About Charity fundraiser in the Last Year:   . Arboriculturist in the Last Year:   Transportation Needs:   . Film/video editor (Medical):   Marland Kitchen Lack of Transportation (Non-Medical):   Physical Activity:   . Days of Exercise per Week:   . Minutes of Exercise per Session:   Stress:   . Feeling of Stress :   Social Connections:   . Frequency of Communication with Friends and Family:   . Frequency of Social Gatherings with Friends and Family:   . Attends Religious Services:   . Active Member of Clubs or Organizations:   . Attends Archivist Meetings:   Marland Kitchen Marital Status:   Intimate Partner Violence:   . Fear of Current or Ex-Partner:   . Emotionally Abused:   Marland Kitchen Physically Abused:   . Sexually Abused:     Family History  Problem Relation Age of Onset  . Hypertension Mother   . Diabetes Father   . Colon cancer Neg Hx   . Esophageal cancer Neg Hx   . Rectal cancer Neg Hx   . Stomach cancer Neg Hx     The following portions of the patient's history were reviewed and updated as appropriate: allergies, current medications, past family history, past medical history, past social history, past surgical history and problem list.  Review of Systems Pertinent items are noted in HPI.   Objective:  BP 120/73   Pulse 83   Ht 5\' 5"  (1.651 m)   Wt 271 lb (122.9 kg)   BMI 45.10 kg/m  Wt Readings from Last 3 Encounters:  06/02/19 271 lb (122.9 kg)  05/20/18 263 lb 1.3 oz (119.3 kg)  12/03/17 264 lb (119.7 kg)     Chaperone present during exam  CONSTITUTIONAL: Well-developed, well-nourished female in no acute distress.  HENT:  Normocephalic, atraumatic, External right and left ear normal. Oropharynx is clear and moist EYES: Conjunctivae and EOM are normal. No scleral icterus.  NECK: Normal  range of motion, supple, no masses.  Normal thyroid.   CARDIOVASCULAR: Normal heart rate noted, regular rhythm RESPIRATORY: Clear to auscultation bilaterally. Effort and breath sounds normal, no problems with  respiration noted. BREASTS: Symmetric in size. No masses, skin changes, nipple drainage, or lymphadenopathy. ABDOMEN: Soft, normal bowel sounds, no distention noted.  No tenderness, rebound or guarding.  PELVIC: Normal appearing external genitalia; normal appearing vaginal mucosa and cervix.  No abnormal discharge noted.  Normal uterine size, no other palpable masses, no uterine or adnexal tenderness. MUSCULOSKELETAL: Normal range of motion. No tenderness.  No cyanosis, clubbing, or edema.  2+ distal pulses. SKIN: Skin is warm and dry. No rash noted. Not diaphoretic. No erythema. No pallor. NEUROLOGIC: Alert and oriented to person, place, and time. Normal reflexes, muscle tone coordination. No cranial nerve deficit noted. PSYCHIATRIC: Normal mood and affect. Normal behavior. Normal judgment and thought content.  Assessment:  Annual gynecologic examination with pap smear   Plan:   1. Well woman exam with routine gynecological exam - Cytology - PAP( [place]) - Will follow up results of pap smear and manage accordingly. - Mammogram scheduled  2. ASCUS with positive high risk HPV cervical Last PAP 6 months ago. Negative. Repeat today.   Routine preventative health maintenance measures emphasized. Please refer to After Visit Summary for other counseling recommendations.    Nile Dear, MS3

## 2019-06-03 ENCOUNTER — Ambulatory Visit
Admission: RE | Admit: 2019-06-03 | Discharge: 2019-06-03 | Disposition: A | Discharge status: Home / Self Care | Payer: Medicare Other | Source: Ambulatory Visit | Attending: Physician Assistant | Admitting: Physician Assistant

## 2019-06-03 DIAGNOSIS — Z1231 Encounter for screening mammogram for malignant neoplasm of breast: Secondary | ICD-10-CM

## 2019-06-07 ENCOUNTER — Telehealth: Payer: Self-pay

## 2019-06-07 LAB — CYTOLOGY - PAP
Comment: NEGATIVE
Diagnosis: NEGATIVE
High risk HPV: NEGATIVE

## 2019-06-07 NOTE — Telephone Encounter (Signed)
Called pt regarding PAP smear results. Pt made aware that her PAP smear was normal. Understanding was voiced. Kerrigan Gombos l Tola Meas, CMA

## 2019-06-07 NOTE — Telephone Encounter (Signed)
-----   Message from Truett Mainland, DO sent at 06/07/2019  2:29 PM EDT ----- Please let her know that her PAP was normal.

## 2019-06-07 NOTE — Progress Notes (Signed)
Please let her know that her PAP was normal.

## 2019-06-08 ENCOUNTER — Telehealth: Payer: Self-pay

## 2019-06-08 NOTE — Telephone Encounter (Signed)
-----   Message from Truett Mainland, DO sent at 06/07/2019  2:29 PM EDT ----- Please let her know that her PAP was normal.

## 2019-06-08 NOTE — Telephone Encounter (Signed)
Patient called and made aware that her pap was normal. Kathrene Alu RN

## 2020-04-23 ENCOUNTER — Other Ambulatory Visit: Payer: Self-pay | Admitting: Physician Assistant

## 2020-04-23 DIAGNOSIS — Z1231 Encounter for screening mammogram for malignant neoplasm of breast: Secondary | ICD-10-CM

## 2020-06-13 ENCOUNTER — Ambulatory Visit
Admission: RE | Admit: 2020-06-13 | Discharge: 2020-06-13 | Disposition: A | Discharge status: Home / Self Care | Payer: Medicare Other | Source: Ambulatory Visit | Attending: Physician Assistant | Admitting: Physician Assistant

## 2020-06-13 ENCOUNTER — Other Ambulatory Visit: Payer: Self-pay

## 2020-06-13 DIAGNOSIS — Z1231 Encounter for screening mammogram for malignant neoplasm of breast: Secondary | ICD-10-CM

## 2020-06-14 ENCOUNTER — Encounter: Payer: Self-pay | Admitting: Family Medicine

## 2020-06-14 ENCOUNTER — Other Ambulatory Visit (HOSPITAL_COMMUNITY)
Admission: RE | Admit: 2020-06-14 | Discharge: 2020-06-14 | Disposition: A | Discharge status: Home / Self Care | Payer: Medicare Other | Source: Ambulatory Visit | Attending: Family Medicine | Admitting: Family Medicine

## 2020-06-14 ENCOUNTER — Ambulatory Visit (INDEPENDENT_AMBULATORY_CARE_PROVIDER_SITE_OTHER): Payer: Medicare Other | Admitting: Family Medicine

## 2020-06-14 VITALS — BP 117/74 | HR 71 | Ht 66.0 in | Wt 265.0 lb

## 2020-06-14 DIAGNOSIS — Z01419 Encounter for gynecological examination (general) (routine) without abnormal findings: Secondary | ICD-10-CM | POA: Insufficient documentation

## 2020-06-14 DIAGNOSIS — Z1151 Encounter for screening for human papillomavirus (HPV): Secondary | ICD-10-CM | POA: Insufficient documentation

## 2020-06-14 DIAGNOSIS — A630 Anogenital (venereal) warts: Secondary | ICD-10-CM

## 2020-06-14 MED ORDER — IMIQUIMOD 5 % EX CREA: TOPICAL_CREAM | CUTANEOUS | 5 refills | Status: DC

## 2020-06-14 NOTE — Progress Notes (Signed)
GYNECOLOGY ANNUAL PREVENTATIVE CARE ENCOUNTER NOTE  Subjective:   Maria Sims is a 60 y.o. G40P0 female here for a routine annual gynecologic exam.  Current complaints: notices a bump on her perineum. Over the past few months, it increases in size for a week, then gets smaller and almost goes away comes back a week or so later. No bleeding, pain, etc.   Denies abnormal vaginal bleeding, discharge, pelvic pain, problems with intercourse or other gynecologic concerns.    Gynecologic History No LMP recorded (lmp unknown). Patient is postmenopausal. Patient is sexually active  Contraception: post menopausal status Last Pap: 2021. Results were: normal (had ASCUS in 2019) Last mammogram: yesterday. Results were: pending  Obstetric History OB History  Gravida Para Term Preterm AB Living  4         3  SAB IAB Ectopic Multiple Live Births          3    # Outcome Date GA Lbr Len/2nd Weight Sex Delivery Anes PTL Lv  4 Gravida      CS-Unspec     3 Gravida           2 Gravida           1 [place]             Past Medical History:  Diagnosis Date  . Anemia   . Anxiety   . Arthritis   . Breast cancer ([place])   . Depression   . Diabetes mellitus without complication ([place])   . GERD (gastroesophageal reflux disease)   . Hyperlipidemia   . Hypertension   . Mental disorder   . Neuromuscular disorder ([place])   . Plantar fasciitis of right foot 09/13/2015  . Sleep apnea   . Tenosynovitis of foot and ankle 09/28/2013    Past Surgical History:  Procedure Laterality Date  . ABDOMINAL HYSTERECTOMY    . BREAST LUMPECTOMY Right    32 years ago   . BREAST RECONSTRUCTION    . BUNIONECTOMY Left 01/26/2014   @ [facility]  . CARPAL TUNNEL RELEASE  2018  . CHOLECYSTECTOMY    . COTTON OSTEOTOMY W/ BONE GRAFT Left 01/26/2014   @ [place]  . KNEE SURGERY    . LEFT HEART CATH AND CORONARY ANGIOGRAPHY N/A 03/20/2016   Procedure: Left Heart Cath and Coronary Angiography;  Surgeon: Peter M  Martinique, MD;  Location: [place] [facility];  Service: Cardiovascular;  Laterality: N/A;  . NECK SURGERY    . REDUCTION MAMMAPLASTY Bilateral     Current Outpatient Medications on File Prior to Visit  Medication Sig Dispense Refill  . APPLE CIDER VINEGAR PO Take by mouth.    . busPIRone (BUSPAR) 15 MG tablet Take 15 mg by mouth 3 (three) times daily.    . clonazePAM (KLONOPIN) 0.5 MG tablet Take 0.5 mg by mouth daily as needed.    . cyclobenzaprine (FLEXERIL) 5 MG tablet Take 1 tablet (5 mg total) by mouth at bedtime as needed for muscle spasms. 30 tablet 3  . DULoxetine (CYMBALTA) 30 MG capsule TAKE 2 CAPSULES BY MOUTH DAILY 180 capsule 3  . fenofibrate (TRICOR) 48 MG tablet Take 48 mg by mouth daily.    . ferrous gluconate (FERGON) 225 (27 Fe) MG tablet Take 1 tablet (240 mg total) by mouth daily. 90 each 1  . gabapentin (NEURONTIN) 400 MG capsule Take 2 capsules (800 mg total) by mouth at bedtime. 60 capsule 2  . losartan-hydrochlorothiazide (HYZAAR)  50-12.5 MG tablet TAKE 1 TABLET BY MOUTH DAILY 90 tablet 3  . metFORMIN (GLUCOPHAGE-XR) 500 MG 24 hr tablet Take 500 mg by mouth 2 (two) times daily.    . Multiple Vitamins-Minerals (ONE-A-DAY 50 PLUS PO) Take by mouth.    . naproxen (NAPROSYN) 500 MG tablet TAKE 1 TABLET(500 MG) BY MOUTH DAILY AS NEEDED FOR MODERATE PAIN 30 tablet 0  . nitroGLYCERIN (NITROSTAT) 0.4 MG SL tablet Place 1 tablet (0.4 mg total) under the tongue every 5 (five) minutes as needed for chest pain. 100 tablet 3  . OVER THE COUNTER MEDICATION Vitamin B 12, 3000 mcg once a day.    . pantoprazole (PROTONIX) 40 MG tablet Take 1 tablet by mouth daily.    . pravastatin (PRAVACHOL) 80 MG tablet Take 80 mg by mouth daily as needed.    Marland Kitchen UNABLE TO FIND Vita Fusion    . Calcium Carb-Cholecalciferol 600-800 MG-UNIT CHEW Chew by mouth. (Patient not taking: Reported on 06/14/2020)     No current facility-administered medications on file prior to visit.    Allergies  Allergen  Reactions  . Pineapple Anaphylaxis and Rash  . Latex Itching and Swelling    redness  . Proanthocyanidin Rash    Social History   Socioeconomic History  . Marital status: Single    Spouse name: Not on file  . Number of children: Not on file  . Years of education: Not on file  . Highest education level: Not on file  Occupational History  . Not on file  Tobacco Use  . Smoking status: Never Smoker  . Smokeless tobacco: Never Used  Substance and Sexual Activity  . Alcohol use: Yes    Alcohol/week: 0.0 standard drinks    Comment: Socially.  . Drug use: No  . Sexual activity: Not on file  Other Topics Concern  . Not on file  Social History Narrative  . Not on file   Social Determinants of Health   Financial Resource Strain: Not on file  Food Insecurity: Not on file  Transportation Needs: Not on file  Physical Activity: Not on file  Stress: Not on file  Social Connections: Not on file  Intimate Partner Violence: Not on file    Family History  Problem Relation Age of Onset  . Hypertension Mother   . Diabetes Father   . Colon cancer Neg Hx   . Esophageal cancer Neg Hx   . Rectal cancer Neg Hx   . Stomach cancer Neg Hx     The following portions of the patient's history were reviewed and updated as appropriate: allergies, current medications, past family history, past medical history, past social history, past surgical history and problem list.  Review of Systems Pertinent items are noted in HPI.   Objective:  BP 117/74 (BP Location: Right Arm, Patient Position: Sitting, Cuff Size: Large)   Pulse 71   Ht 5\' 6"  (1.676 m)   Wt 265 lb (120.2 kg)   LMP  (LMP Unknown)   BMI 42.77 kg/m  Wt Readings from Last 3 Encounters:  06/14/20 265 lb (120.2 kg)  06/02/19 271 lb (122.9 kg)  05/20/18 263 lb 1.3 oz (119.3 kg)     Chaperone present during exam  CONSTITUTIONAL: Well-developed, well-nourished female in no acute distress.  HENT:  Normocephalic, atraumatic,  External right and left ear normal. Oropharynx is clear and moist EYES: Conjunctivae and EOM are normal. Pupils are equal, round, and reactive to light. No scleral icterus.  NECK: Normal  range of motion, supple, no masses.  Normal thyroid.   CARDIOVASCULAR: Normal heart rate noted, regular rhythm RESPIRATORY: Clear to auscultation bilaterally. Effort and breath sounds normal, no problems with respiration noted. BREASTS: Symmetric in size. No masses, skin changes, nipple drainage, or lymphadenopathy. ABDOMEN: Soft, normal bowel sounds, no distention noted.  No tenderness, rebound or guarding.  PELVIC: Normal appearing external genitalia with 3 small 38mm flat warts at introitus; normal appearing vaginal mucosa and cervix.  No abnormal discharge noted. At perineum, 12mm white area at junction of anus and perineum. MUSCULOSKELETAL: Normal range of motion. No tenderness.  No cyanosis, clubbing, or edema.  2+ distal pulses. SKIN: Skin is warm and dry. No rash noted. Not diaphoretic. No erythema. No pallor. NEUROLOGIC: Alert and oriented to person, place, and time. Normal reflexes, muscle tone coordination. No cranial nerve deficit noted. PSYCHIATRIC: Normal mood and affect. Normal behavior. Normal judgment and thought content.  Assessment:  Annual gynecologic examination with pap smear   Plan:  1. Well Woman Exam Will follow up results of pap smear and manage accordingly. - Cytology - PAP  2. Genital warts Start aldara. F/u in 3-4 months.   Routine preventative health maintenance measures emphasized. Please refer to After Visit Summary for other counseling recommendations.    Loma Boston, [place] for [employer]

## 2020-06-14 NOTE — Progress Notes (Signed)
Pt presents today for yearly exam. Last pap: 06/02/19 WNL  MMG: 06/13/20 Results pending - 5/22 WNL Colon: none completed. Pt is having issue with incontinence.

## 2020-06-18 LAB — CYTOLOGY - PAP
Comment: NEGATIVE
Diagnosis: NEGATIVE
High risk HPV: NEGATIVE

## 2020-06-19 ENCOUNTER — Telehealth: Payer: Self-pay | Admitting: Family Medicine

## 2020-06-19 NOTE — Telephone Encounter (Signed)
-----   Message from Lyndal Rainbow, [place] sent at 06/15/2020 10:20 AM EDT ----- Regarding: Allergic Reaction Hey Dr.Carman Essick,  This pt was given Aldara after her appt yesterday. She called today and said she had an allergic reaction to it. I told the pt not to take it anymore. She is requesting that you send a different medication to her pharmacy.  Thank you

## 2020-06-19 NOTE — Telephone Encounter (Signed)
What reaction did she have to the medication?

## 2020-06-19 NOTE — Telephone Encounter (Signed)
Talked with patient - will schedule for TCA application.

## 2020-06-21 ENCOUNTER — Ambulatory Visit (INDEPENDENT_AMBULATORY_CARE_PROVIDER_SITE_OTHER): Payer: Medicare Other | Admitting: Family Medicine

## 2020-06-21 ENCOUNTER — Other Ambulatory Visit: Payer: Self-pay

## 2020-06-21 ENCOUNTER — Encounter: Payer: Self-pay | Admitting: General Practice

## 2020-06-21 ENCOUNTER — Encounter: Payer: Self-pay | Admitting: Family Medicine

## 2020-06-21 VITALS — BP 121/62 | HR 74 | Ht 66.0 in | Wt 258.0 lb

## 2020-06-21 DIAGNOSIS — A630 Anogenital (venereal) warts: Secondary | ICD-10-CM | POA: Diagnosis not present

## 2020-06-21 NOTE — Progress Notes (Signed)
TCA applied to wart like area on ano-perineal junction and on flat warts on lower introitus after spreading lubricant on the normal tissue surrounding the warts. Patient tolerated well. F/u in 2 weeks.

## 2020-06-21 NOTE — Progress Notes (Signed)
Pt presents today for TCA treatment.

## 2020-07-05 ENCOUNTER — Ambulatory Visit (INDEPENDENT_AMBULATORY_CARE_PROVIDER_SITE_OTHER): Payer: Medicare Other | Admitting: Family Medicine

## 2020-07-05 ENCOUNTER — Other Ambulatory Visit: Payer: Self-pay

## 2020-07-05 VITALS — BP 111/71 | HR 67 | Wt 260.0 lb

## 2020-07-05 DIAGNOSIS — A630 Anogenital (venereal) warts: Secondary | ICD-10-CM

## 2020-07-05 NOTE — Progress Notes (Signed)
Patient here for follow up of gential warts.  TCA applied to wart like area on ano-perineal junction and on flat warts on lower introitus after spreading lubricant on the normal tissue surrounding the warts. Patient tolerated well. F/u in 2 weeks.

## 2020-07-19 ENCOUNTER — Ambulatory Visit (INDEPENDENT_AMBULATORY_CARE_PROVIDER_SITE_OTHER): Payer: Medicare Other | Admitting: Family Medicine

## 2020-07-19 ENCOUNTER — Other Ambulatory Visit: Payer: Self-pay

## 2020-07-19 VITALS — BP 112/74 | HR 69 | Wt 262.0 lb

## 2020-07-19 DIAGNOSIS — A630 Anogenital (venereal) warts: Secondary | ICD-10-CM | POA: Diagnosis not present

## 2020-07-19 NOTE — Progress Notes (Signed)
Patient here for follow up of gential warts. Appears a little improved TCA applied to wart like area on ano-perineal junction and on flat warts on lower introitus after spreading lubricant on the normal tissue surrounding the warts. Patient tolerated well. F/u in 2 weeks.

## 2020-08-02 ENCOUNTER — Ambulatory Visit (INDEPENDENT_AMBULATORY_CARE_PROVIDER_SITE_OTHER): Payer: Medicare Other | Admitting: Family Medicine

## 2020-08-02 ENCOUNTER — Other Ambulatory Visit: Payer: Self-pay

## 2020-08-02 VITALS — BP 116/72 | HR 67 | Wt 259.0 lb

## 2020-08-02 DIAGNOSIS — A630 Anogenital (venereal) warts: Secondary | ICD-10-CM | POA: Diagnosis not present

## 2020-08-02 NOTE — Progress Notes (Signed)
Patient here for follow up of gential warts.  TCA applied to wart like area on ano-perineal junction and on flat warts on lower introitus after spreading lubricant on the normal tissue surrounding the warts. Patient tolerated well. F/u in 3 weeks.

## 2020-08-23 ENCOUNTER — Other Ambulatory Visit: Payer: Self-pay

## 2020-08-23 ENCOUNTER — Encounter: Payer: Self-pay | Admitting: Family Medicine

## 2020-08-23 ENCOUNTER — Ambulatory Visit (INDEPENDENT_AMBULATORY_CARE_PROVIDER_SITE_OTHER): Payer: Medicare Other | Admitting: Family Medicine

## 2020-08-23 VITALS — BP 119/74 | HR 67 | Wt 259.0 lb

## 2020-08-23 DIAGNOSIS — A630 Anogenital (venereal) warts: Secondary | ICD-10-CM

## 2020-08-23 NOTE — Progress Notes (Signed)
   Subjective:    Patient ID: Maria Sims, female    DOB: Apr 14, 1960, 60 y.o.   MRN: [id number]  HPI Patient seen for follow up of TCA treatments. She has had 4 treatments.     Review of Systems     Objective:   Physical Exam Vitals reviewed. Exam conducted with a chaperone present.  Constitutional:      Appearance: Normal appearance.  Genitourinary:   Neurological:     Mental Status: She is alert.       Assessment & Plan:  1. Genital warts Not improving with treatment. Will have patient see my partner, Dr Elonda Husky, who does laser treatments.

## 2020-08-27 ENCOUNTER — Telehealth: Payer: Self-pay | Admitting: General Practice

## 2020-08-27 NOTE — Telephone Encounter (Signed)
-----   Message from Maurine Minister, [place] sent at 06/14/2020  9:48 AM EDT ----- Regarding: Follow Up Return in about 4 months (around 10/14/2020) for follow up warts with Dr Nehemiah Settle.

## 2020-08-27 NOTE — Telephone Encounter (Signed)
Left message on VM for pt to contact our office to schedule 4 month follow up in October 2022 with Dr. Nehemiah Settle.

## 2020-09-06 ENCOUNTER — Other Ambulatory Visit: Payer: Self-pay

## 2020-09-06 ENCOUNTER — Ambulatory Visit (INDEPENDENT_AMBULATORY_CARE_PROVIDER_SITE_OTHER): Payer: Medicare Other | Admitting: Obstetrics & Gynecology

## 2020-09-06 ENCOUNTER — Encounter: Payer: Self-pay | Admitting: Obstetrics & Gynecology

## 2020-09-06 VITALS — BP 135/81 | HR 68 | Ht 66.0 in | Wt 259.0 lb

## 2020-09-06 DIAGNOSIS — A630 Anogenital (venereal) warts: Secondary | ICD-10-CM

## 2020-09-06 NOTE — Progress Notes (Signed)
Chief Complaint  Patient presents with   Pre-op Exam      60 y.o. G4P3 No LMP recorded (lmp unknown). Patient has had a hysterectomy. The current method of family planning is status post hysterectomy.  Outpatient Encounter Medications as of 09/06/2020  Medication Sig   Calcium Carb-Cholecalciferol 600-800 MG-UNIT CHEW Chew by mouth.   clonazePAM (KLONOPIN) 0.5 MG tablet Take 0.5 mg by mouth at bedtime.   cyclobenzaprine (FLEXERIL) 5 MG tablet Take 1 tablet (5 mg total) by mouth at bedtime as needed for muscle spasms.   fenofibrate (TRICOR) 48 MG tablet Take 48 mg by mouth daily.   ferrous gluconate (FERGON) 225 (27 Fe) MG tablet Take 1 tablet (240 mg total) by mouth daily.   gabapentin (NEURONTIN) 400 MG capsule Take 2 capsules (800 mg total) by mouth at bedtime.   losartan-hydrochlorothiazide (HYZAAR) 50-12.5 MG tablet TAKE 1 TABLET BY MOUTH DAILY   Multiple Vitamins-Minerals (ONE-A-DAY 50 PLUS PO) Take by mouth.   naproxen (NAPROSYN) 500 MG tablet TAKE 1 TABLET(500 MG) BY MOUTH DAILY AS NEEDED FOR MODERATE PAIN   OVER THE COUNTER MEDICATION Vitamin B 12, 3000 mcg once a day.   pantoprazole (PROTONIX) 40 MG tablet Take 1 tablet by mouth daily.   pravastatin (PRAVACHOL) 80 MG tablet Take 80 mg by mouth daily as needed.   UNABLE TO FIND Vita Fusion   metFORMIN (GLUCOPHAGE-XR) 500 MG 24 hr tablet Take 500 mg by mouth 2 (two) times daily. (Patient not taking: Reported on 09/06/2020)   nitroGLYCERIN (NITROSTAT) 0.4 MG SL tablet Place 1 tablet (0.4 mg total) under the tongue every 5 (five) minutes as needed for chest pain.   [DISCONTINUED] APPLE CIDER VINEGAR PO Take by mouth.   [DISCONTINUED] busPIRone (BUSPAR) 15 MG tablet Take 15 mg by mouth 3 (three) times daily.   [DISCONTINUED] DULoxetine (CYMBALTA) 30 MG capsule TAKE 2 CAPSULES BY MOUTH DAILY   No facility-administered encounter medications on file as of 09/06/2020.    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 Past Medical History:  Diagnosis Date   Anemia    Anxiety    Arthritis    Breast cancer ([place])    Depression    Diabetes mellitus without complication ([place])    GERD (gastroesophageal reflux disease)    Hyperlipidemia    Hypertension    Mental disorder    Neuromuscular disorder ([place])    Plantar fasciitis of right foot 09/13/2015   Sleep apnea    Tenosynovitis of foot and ankle 09/28/2013    Past Surgical History:  Procedure Laterality Date   ABDOMINAL HYSTERECTOMY     BREAST LUMPECTOMY Right    32 years ago    BREAST RECONSTRUCTION     BUNIONECTOMY Left 01/26/2014   @ [place]  2018   [place] W/ BONE GRAFT Left 01/26/2014   @ [place] CATH AND CORONARY ANGIOGRAPHY N/A 03/20/2016   Procedure: Left Heart Cath and Coronary Angiography;  Surgeon: Peter M Martinique, MD;  Location: [facility];  Service: Cardiovascular;  Laterality: N/A;   NECK SURGERY     REDUCTION MAMMAPLASTY Bilateral     OB History     Gravida  4  Para  3   Term      Preterm      AB      Living  3      SAB      IAB      Ectopic      Multiple      Live Births  3           Allergies  Allergen Reactions   Aldara [Imiquimod] Anaphylaxis   Pineapple Anaphylaxis and Rash   Latex Itching and Swelling    redness   Proanthocyanidin Rash    Social History   Socioeconomic History   Marital status: Single    Spouse name: Not on file   Number of children: Not on file   Years of education: Not on file   Highest education level: Not on file  Occupational History   Not on file  Tobacco Use   Smoking status: Never   Smokeless tobacco: Never  Vaping Use   Vaping Use: Never used  Substance and Sexual Activity   Alcohol use: Yes     Alcohol/week: 0.0 standard drinks    Comment: Socially.   Drug use: No   Sexual activity: Not Currently    Birth control/protection: Surgical    Comment: hyst  Other Topics Concern   Not on file  Social History Narrative   Not on file   Social Determinants of Health   Financial Resource Strain: Not on file  Food Insecurity: Not on file  Transportation Needs: Not on file  Physical Activity: Not on file  Stress: Not on file  Social Connections: Not on file    Family History  Problem Relation Age of Onset   Hypertension Mother    Diabetes Father    Colon cancer Neg Hx    Esophageal cancer Neg Hx    Rectal cancer Neg Hx    Stomach cancer Neg Hx     Medications:       Current Outpatient Medications:    Calcium Carb-Cholecalciferol 600-800 MG-UNIT CHEW, Chew by mouth., Disp: , Rfl:    clonazePAM (KLONOPIN) 0.5 MG tablet, Take 0.5 mg by mouth at bedtime., Disp: , Rfl:    cyclobenzaprine (FLEXERIL) 5 MG tablet, Take 1 tablet (5 mg total) by mouth at bedtime as needed for muscle spasms., Disp: 30 tablet, Rfl: 3   fenofibrate (TRICOR) 48 MG tablet, Take 48 mg by mouth daily., Disp: , Rfl:    ferrous gluconate (FERGON) 225 (27 Fe) MG tablet, Take 1 tablet (240 mg total) by mouth daily., Disp: 90 each, Rfl: 1   gabapentin (NEURONTIN) 400 MG capsule, Take 2 capsules (800 mg total) by mouth at bedtime., Disp: 60 capsule, Rfl: 2   losartan-hydrochlorothiazide (HYZAAR) 50-12.5 MG tablet, TAKE 1 TABLET BY MOUTH DAILY, Disp: 90 tablet, Rfl: 3   Multiple Vitamins-Minerals (ONE-A-DAY 50 PLUS PO), Take by mouth., Disp: , Rfl:    naproxen (NAPROSYN) 500 MG tablet, TAKE 1 TABLET(500 MG) BY MOUTH DAILY AS NEEDED FOR MODERATE PAIN, Disp: 30 tablet, Rfl: 0   OVER THE COUNTER MEDICATION, Vitamin B 12, 3000 mcg once a day., Disp: , Rfl:    pantoprazole (PROTONIX) 40 MG tablet, Take 1 tablet by mouth daily., Disp: , Rfl:    pravastatin (PRAVACHOL) 80 MG tablet, Take 80 mg by mouth daily as needed.,  Disp: , Rfl:    UNABLE TO FIND, Vita Fusion, Disp: , Rfl:    metFORMIN (GLUCOPHAGE-XR) 500 MG 24 hr tablet, Take 500 mg  by mouth 2 (two) times daily. (Patient not taking: Reported on 09/06/2020), Disp: , Rfl:    nitroGLYCERIN (NITROSTAT) 0.4 MG SL tablet, Place 1 tablet (0.4 mg total) under the tongue every 5 (five) minutes as needed for chest pain., Disp: 100 tablet, Rfl: 3  Objective Blood pressure 135/81, pulse 68, height '5\' 6"'$  (1.676 m), weight 259 lb (117.5 kg).  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 Genital warts  A63.0       Established relevant diagnosis(es):   Plan/Recommendations: No orders of the defined types were placed in this encounter.   Labs or Scans Ordered: No orders of the defined types were placed in this encounter.   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     All questions were answered.

## 2020-09-10 ENCOUNTER — Telehealth: Payer: Self-pay

## 2020-09-10 ENCOUNTER — Telehealth: Payer: Self-pay | Admitting: Obstetrics & Gynecology

## 2020-09-10 NOTE — Telephone Encounter (Signed)
Patient called stating that she saw Dr. Elonda Husky at [facility] and he consulted her for laser ablation of vulvar condylomata. Patient was willing to schedule but she talked with financial counseling and cant afford the copay of surgery $550.patient states she is going to have to cancel her surgery. Kathrene Alu RN

## 2020-09-10 NOTE — Telephone Encounter (Signed)
Pt called the after hours nurse line during lunch today & decided to not have the surgery (was scheduled for 09/19/20)

## 2020-09-10 NOTE — Telephone Encounter (Signed)
Pt called stating she was seen at the [facility] tree office for laser surgery consult.Pt states that her co-pay is $500. Pt states that Per Dr. Elonda Husky, the TCA treatment done by Dr. Nehemiah Settle is shrinking the warts but it is going to take more time and treatments. She states that she can not afford the surgery and she would like to continue the TCA treatments. A message will be sent to the provider. Kimbella Heisler l Aleayah [place], CMA

## 2020-09-11 NOTE — Telephone Encounter (Signed)
Can return for continued TCA treatments every 2-3 weeks.

## 2020-09-12 ENCOUNTER — Telehealth: Payer: Self-pay | Admitting: General Practice

## 2020-09-12 NOTE — Telephone Encounter (Signed)
-----   Message from Valentina Lucks, [place] sent at 09/12/2020 10:29 AM EDT ----- Regarding: FW: Laser surgery  ----- Message ----- From: Truett Mainland, DO Sent: 09/10/2020   2:33 PM EDT To: Valentina Lucks, CMA Subject: RE: Laser surgery                              Ok - she can be scheduled every 2-3 weeks for TCA.  ----- Message ----- From: Valentina Lucks, CMA Sent: 09/10/2020   2:06 PM EDT To: Truett Mainland, DO Subject: Laser surgery                                  You referred this pt to Dr. Elonda Husky for laser removal of her genital warts. She states that Dr. Elonda Husky says that the warts seem to be responding to the TCA treatment but because they are so small it is going to take more time to clear up. The pt states that she can not afford the $500 co-pay so she prefers to continue TCA treatments here in the office. Please advise.

## 2020-09-12 NOTE — Telephone Encounter (Signed)
Left message on VM to patient to contact our office to schedule follow up in 2-3 weeks with Dr. Nehemiah Settle.

## 2020-09-18 ENCOUNTER — Other Ambulatory Visit (HOSPITAL_COMMUNITY): Payer: Medicare Other

## 2020-09-19 ENCOUNTER — Encounter (HOSPITAL_COMMUNITY): Admission: RE | Payer: Self-pay | Source: Home / Self Care

## 2020-09-19 ENCOUNTER — Ambulatory Visit (HOSPITAL_COMMUNITY)
Admission: RE | Admit: 2020-09-19 | Payer: Medicare Other | Source: Home / Self Care | Admitting: Obstetrics & Gynecology

## 2020-09-19 SURGERY — ABLATION, CONDYLOMA, USING LASER
Anesthesia: General

## 2020-10-02 ENCOUNTER — Encounter: Payer: Medicare Other | Admitting: Obstetrics & Gynecology

## 2020-10-05 ENCOUNTER — Encounter: Payer: Self-pay | Admitting: Family Medicine

## 2020-10-05 ENCOUNTER — Other Ambulatory Visit: Payer: Self-pay

## 2020-10-05 ENCOUNTER — Ambulatory Visit (INDEPENDENT_AMBULATORY_CARE_PROVIDER_SITE_OTHER): Payer: Medicare Other | Admitting: Family Medicine

## 2020-10-05 VITALS — BP 134/81 | HR 70 | Wt 261.0 lb

## 2020-10-05 DIAGNOSIS — A630 Anogenital (venereal) warts: Secondary | ICD-10-CM | POA: Diagnosis not present

## 2020-10-05 NOTE — Progress Notes (Signed)
Patient here for follow up of gential warts.  She saw my partner, Dr Elonda Husky, but is unable to afford laser treatment.  Additionally, warts appear to be smaller in size. TCA applied to wart like area on flat warts on lower introitus after spreading lubricant on the normal tissue surrounding the warts. Patient tolerated well. F/u in 2-3 weeks.

## 2020-11-02 ENCOUNTER — Encounter: Payer: Self-pay | Admitting: Family Medicine

## 2020-11-02 ENCOUNTER — Ambulatory Visit (INDEPENDENT_AMBULATORY_CARE_PROVIDER_SITE_OTHER): Payer: Medicare Other | Admitting: Family Medicine

## 2020-11-02 ENCOUNTER — Other Ambulatory Visit: Payer: Self-pay

## 2020-11-02 VITALS — BP 138/82 | HR 89 | Wt 265.0 lb

## 2020-11-02 DIAGNOSIS — A630 Anogenital (venereal) warts: Secondary | ICD-10-CM

## 2020-11-02 NOTE — Progress Notes (Signed)
   Subjective:    Patient ID: Maria Sims, female    DOB: 05-18-1960, 60 y.o.   MRN: [id number]  HPI Seen for follow up of vaginal warts. Reports no problems.   Review of Systems     Objective:   Physical Exam Vitals reviewed.  Genitourinary:    Comments: Warts have resolved         Assessment & Plan:   1. Genital warts resolved

## 2020-11-15 ENCOUNTER — Ambulatory Visit: Payer: Medicare Other | Admitting: Family Medicine

## 2020-11-30 ENCOUNTER — Ambulatory Visit: Payer: Medicare Other | Admitting: Family Medicine

## 2020-12-14 ENCOUNTER — Ambulatory Visit: Payer: Medicare Other | Admitting: Family Medicine

## 2021-04-18 ENCOUNTER — Encounter: Payer: Self-pay | Admitting: General Practice

## 2021-06-11 ENCOUNTER — Other Ambulatory Visit: Payer: Self-pay | Admitting: Physician Assistant

## 2021-06-11 DIAGNOSIS — Z1231 Encounter for screening mammogram for malignant neoplasm of breast: Secondary | ICD-10-CM

## 2021-06-14 ENCOUNTER — Ambulatory Visit
Admission: RE | Admit: 2021-06-14 | Discharge: 2021-06-14 | Disposition: A | Discharge status: Home / Self Care | Payer: Medicare Other | Source: Ambulatory Visit | Attending: Physician Assistant | Admitting: Physician Assistant

## 2021-06-14 DIAGNOSIS — Z1231 Encounter for screening mammogram for malignant neoplasm of breast: Secondary | ICD-10-CM

## 2021-07-10 ENCOUNTER — Other Ambulatory Visit (HOSPITAL_COMMUNITY)
Admission: RE | Admit: 2021-07-10 | Discharge: 2021-07-10 | Disposition: A | Discharge status: Home / Self Care | Payer: Medicare Other | Source: Ambulatory Visit | Attending: Family Medicine | Admitting: Family Medicine

## 2021-07-10 ENCOUNTER — Ambulatory Visit (INDEPENDENT_AMBULATORY_CARE_PROVIDER_SITE_OTHER): Payer: Medicare Other | Admitting: Family Medicine

## 2021-07-10 ENCOUNTER — Encounter: Payer: Self-pay | Admitting: Family Medicine

## 2021-07-10 VITALS — BP 122/85 | HR 72 | Ht 66.0 in | Wt 264.0 lb

## 2021-07-10 DIAGNOSIS — Z01419 Encounter for gynecological examination (general) (routine) without abnormal findings: Secondary | ICD-10-CM

## 2021-07-10 NOTE — Progress Notes (Signed)
GYNECOLOGY ANNUAL PREVENTATIVE CARE ENCOUNTER NOTE  Subjective:   Maria Sims is a 61 y.o. G4P3 female here for a routine annual gynecologic exam.  Current complaints: occasional vaginal odor using a vaginal moisturizer. Has stopped it and not having any odor now. Did start Ozempic to help with weight loss.   Denies abnormal vaginal bleeding, discharge, pelvic pain, problems with intercourse or other gynecologic concerns.    Gynecologic History No LMP recorded (lmp unknown). Patient has had a hysterectomy. Contraception: post menopausal status Last Pap: 2022. Results were: normal Last mammogram: 06/2021. Results were: normal  The pregnancy intention screening data noted above was reviewed. Potential methods of contraception were discussed. The patient elected to proceed with No data recorded.   Obstetric History OB History  Gravida Para Term Preterm AB Living  '4 3       3  '$ SAB IAB Ectopic Multiple Live Births          3    # Outcome Date GA Lbr Len/2nd Weight Sex Delivery Anes PTL Lv  4 Gravida      CS-Unspec     3 Para           2 Para           1 Para             Past Medical History:  Diagnosis Date   Anemia    Anxiety    Arthritis    Breast cancer ([place])    Depression    Diabetes mellitus without complication ([facility])    GERD (gastroesophageal reflux disease)    Hyperlipidemia    Hypertension    Mental disorder    Neuromuscular disorder ([place])    Plantar fasciitis of right foot 09/13/2015   Sleep apnea    Tenosynovitis of foot and ankle 09/28/2013    Past Surgical History:  Procedure Laterality Date   ABDOMINAL HYSTERECTOMY     BREAST LUMPECTOMY Right    32 years ago    BREAST RECONSTRUCTION     BUNIONECTOMY Left 01/26/2014   @ [place]  2018   [place] W/ BONE GRAFT Left 01/26/2014   @ [place]   KNEE SURGERY     LEFT HEART CATH AND CORONARY ANGIOGRAPHY N/A 03/20/2016   Procedure: Left  Heart Cath and Coronary Angiography;  Surgeon: Peter M Martinique, MD;  Location: [facility];  Service: Cardiovascular;  Laterality: N/A;   NECK SURGERY     REDUCTION MAMMAPLASTY Bilateral     Current Outpatient Medications on File Prior to Visit  Medication Sig Dispense Refill   Calcium Carb-Cholecalciferol 600-800 MG-UNIT CHEW Chew by mouth.     clonazePAM (KLONOPIN) 0.5 MG tablet Take 0.5 mg by mouth at bedtime.     cyclobenzaprine (FLEXERIL) 5 MG tablet Take 1 tablet (5 mg total) by mouth at bedtime as needed for muscle spasms. 30 tablet 3   fenofibrate (TRICOR) 48 MG tablet Take 48 mg by mouth daily.     ferrous gluconate (FERGON) 225 (27 Fe) MG tablet Take 1 tablet (240 mg total) by mouth daily. 90 each 1   gabapentin (NEURONTIN) 400 MG capsule Take 2 capsules (800 mg total) by mouth at bedtime. 60 capsule 2   losartan-hydrochlorothiazide (HYZAAR) 50-12.5 MG tablet TAKE 1 TABLET BY MOUTH DAILY 90 tablet 3   Multiple Vitamins-Minerals (ONE-A-DAY 50 PLUS PO) Take by mouth.     naproxen (  NAPROSYN) 500 MG tablet TAKE 1 TABLET(500 MG) BY MOUTH DAILY AS NEEDED FOR MODERATE PAIN 30 tablet 0   OVER THE COUNTER MEDICATION Vitamin B 12, 3000 mcg once a day.     pantoprazole (PROTONIX) 40 MG tablet Take 1 tablet by mouth daily.     pravastatin (PRAVACHOL) 80 MG tablet Take 80 mg by mouth daily as needed.     Semaglutide,0.25 or 0.'5MG'$ /DOS, (OZEMPIC, 0.25 OR 0.5 MG/DOSE,) 2 MG/1.5ML SOPN Inject into the skin.     UNABLE TO FIND Vita Fusion     metFORMIN (GLUCOPHAGE-XR) 500 MG 24 hr tablet Take 500 mg by mouth 2 (two) times daily. (Patient not taking: Reported on 07/10/2021)     nitroGLYCERIN (NITROSTAT) 0.4 MG SL tablet Place 1 tablet (0.4 mg total) under the tongue every 5 (five) minutes as needed for chest pain. 100 tablet 3   No current facility-administered medications on file prior to visit.    Allergies  Allergen Reactions   Aldara [Imiquimod] Anaphylaxis   Pineapple Anaphylaxis and  Rash   Latex Itching and Swelling    redness   Proanthocyanidin Rash    Social History   Socioeconomic History   Marital status: Single    Spouse name: Not on file   Number of children: Not on file   Years of education: Not on file   Highest education level: Not on file  Occupational History   Not on file  Tobacco Use   Smoking status: Never   Smokeless tobacco: Never  Vaping Use   Vaping Use: Never used  Substance and Sexual Activity   Alcohol use: Yes    Alcohol/week: 0.0 standard drinks of alcohol    Comment: Socially.   Drug use: No   Sexual activity: Not Currently    Birth control/protection: Surgical    Comment: hyst  Other Topics Concern   Not on file  Social History Narrative   Not on file   Social Determinants of Health   Financial Resource Strain: Not on file  Food Insecurity: Not on file  Transportation Needs: Not on file  Physical Activity: Not on file  Stress: Not on file  Social Connections: Not on file  Intimate Partner Violence: Not on file    Family History  Problem Relation Age of Onset   Hypertension Mother    Diabetes Father    Colon cancer Neg Hx    Esophageal cancer Neg Hx    Rectal cancer Neg Hx    Stomach cancer Neg Hx     The following portions of the patient's history were reviewed and updated as appropriate: allergies, current medications, past family history, past medical history, past social history, past surgical history and problem list.  Review of Systems Pertinent items are noted in HPI.   Objective:  BP 122/85   Pulse 72   Ht '5\' 6"'$  (1.676 m)   Wt 264 lb (119.7 kg)   LMP  (LMP Unknown)   BMI 42.61 kg/m  Wt Readings from Last 3 Encounters:  07/10/21 264 lb (119.7 kg)  11/02/20 265 lb (120.2 kg)  10/05/20 261 lb (118.4 kg)     Chaperone present during exam  CONSTITUTIONAL: Well-developed, well-nourished female in no acute distress.  HENT:  Normocephalic, atraumatic, External right and left ear normal.  Oropharynx is clear and moist EYES: Conjunctivae and EOM are normal. Pupils are equal, round, and reactive to light. No scleral icterus.  NECK: Normal range of motion, supple, no masses.  Normal thyroid.  CARDIOVASCULAR: Normal heart rate noted, regular rhythm RESPIRATORY: Clear to auscultation bilaterally. Effort and breath sounds normal, no problems with respiration noted. BREASTS: Symmetric in size. No masses, skin changes, nipple drainage, or lymphadenopathy. ABDOMEN: Soft, normal bowel sounds, no distention noted.  No tenderness, rebound or guarding.  PELVIC: Normal appearing external genitalia; normal appearing vaginal mucosa and cervix.  No abnormal discharge noted.  Normal uterine size, no other palpable masses, no uterine or adnexal tenderness. MUSCULOSKELETAL: Normal range of motion. No tenderness.  No cyanosis, clubbing, or edema.  2+ distal pulses. SKIN: Skin is warm and dry. No rash noted. Not diaphoretic. No erythema. No pallor. NEUROLOGIC: Alert and oriented to person, place, and time. Normal reflexes, muscle tone coordination. No cranial nerve deficit noted. PSYCHIATRIC: Normal mood and affect. Normal behavior. Normal judgment and thought content.  Assessment:  Annual gynecologic examination with pap smear   Plan:  1. Well Woman Exam Will follow up results of pap smear and manage accordingly. Mammogram reviewed STD testing discussed. Patient requested testing    Routine preventative health maintenance measures emphasized. Please refer to After Visit Summary for other counseling recommendations.    Loma Boston, [place] for [employer]

## 2021-07-11 LAB — CERVICOVAGINAL ANCILLARY ONLY
Chlamydia: NEGATIVE
Comment: NEGATIVE
Comment: NEGATIVE
Comment: NORMAL
Neisseria Gonorrhea: NEGATIVE
Trichomonas: NEGATIVE

## 2022-04-12 HISTORY — PX: KNEE SURGERY: SHX244

## 2022-06-16 ENCOUNTER — Other Ambulatory Visit: Payer: Self-pay | Admitting: Physician Assistant

## 2022-06-16 DIAGNOSIS — Z0000 Encounter for general adult medical examination without abnormal findings: Secondary | ICD-10-CM

## 2022-06-18 ENCOUNTER — Ambulatory Visit
Admission: RE | Admit: 2022-06-18 | Discharge: 2022-06-18 | Disposition: A | Discharge status: Home / Self Care | Payer: 59 | Source: Ambulatory Visit | Attending: Physician Assistant | Admitting: Physician Assistant

## 2022-06-18 DIAGNOSIS — Z0000 Encounter for general adult medical examination without abnormal findings: Secondary | ICD-10-CM

## 2022-10-01 ENCOUNTER — Encounter: Payer: Self-pay | Admitting: Family Medicine

## 2022-10-01 ENCOUNTER — Ambulatory Visit (INDEPENDENT_AMBULATORY_CARE_PROVIDER_SITE_OTHER): Payer: 59 | Admitting: Family Medicine

## 2022-10-01 ENCOUNTER — Other Ambulatory Visit (HOSPITAL_COMMUNITY)
Admission: RE | Admit: 2022-10-01 | Discharge: 2022-10-01 | Disposition: A | Discharge status: Home / Self Care | Payer: 59 | Source: Ambulatory Visit | Attending: Family Medicine | Admitting: Family Medicine

## 2022-10-01 VITALS — BP 129/85 | HR 73 | Ht 65.0 in | Wt 237.0 lb

## 2022-10-01 DIAGNOSIS — Z01419 Encounter for gynecological examination (general) (routine) without abnormal findings: Secondary | ICD-10-CM | POA: Diagnosis present

## 2022-10-01 DIAGNOSIS — Z1339 Encounter for screening examination for other mental health and behavioral disorders: Secondary | ICD-10-CM | POA: Diagnosis not present

## 2022-10-01 NOTE — Progress Notes (Signed)
ANNUAL EXAM Patient name: Maria Sims MRN [id number]  Date of birth: May 25, 1960 Chief Complaint:   Annual Exam  History of Present Illness:   Maria Sims is a 62 y.o.  G4P3  female  being seen today for a routine annual exam.  Current complaints: none. Has a lot of stress currently. Her daughter and granddaughter are living with her for the moment in her 2 bedroom apartment. She's not getting a lot of support from them.  Had right knee replacement 6 months ago. Doing well.  No LMP recorded (lmp unknown). Patient has had a hysterectomy.    Last pap 2022. Results were: NILM w/ HRHPV negative. H/O abnormal pap: yes Last mammogram: 06/2022. Results were: normal.  Last colonoscopy: 2019. Results were: normal. Rpt in 10 years recommended.     10/01/2022   10:37 AM 10/14/2017    2:04 PM 07/15/2017    1:41 PM 04/15/2017    2:30 PM 01/15/2017   10:06 AM  Depression screen PHQ 2/9  Decreased Interest 1 1 0 0 3  Down, Depressed, Hopeless 1 1 0 0 3  PHQ - 2 Score 2 2 0 0 6  Altered sleeping 0 2   3  Tired, decreased energy 1 1   2   Change in appetite 0 1   3  Feeling bad or failure about yourself  1 0   3  Trouble concentrating 0 0   1  Moving slowly or fidgety/restless 0 1   2  Suicidal thoughts 0 0   0  PHQ-9 Score 4 7   20   Difficult doing work/chores  Not difficult at all   Somewhat difficult        10/01/2022   10:37 AM 10/14/2017    2:52 PM 03/28/2015    2:24 PM 01/17/2015   11:02 AM  GAD 7 : Generalized Anxiety Score  Nervous, Anxious, on Edge 0 1 2 1   Control/stop worrying 1 2 1 3   Worry too much - different things 1 1 0 3  Trouble relaxing 1 3 1 3   Restless 0 0 0 1  Easily annoyed or irritable 1 1 1 1   Afraid - awful might happen 0 0 0 0  Total GAD 7 Score 4 8 5 12      Review of Systems:   Pertinent items are noted in HPI Denies any headaches, blurred vision, fatigue, shortness of breath, chest pain, abdominal pain, abnormal vaginal  discharge/itching/odor/irritation, problems with periods, bowel movements, urination, or intercourse unless otherwise stated above. Pertinent History Reviewed:  Reviewed past medical,surgical, social and family history.  Reviewed problem list, medications and allergies. Physical Assessment:   Vitals:   10/01/22 1033  BP: 129/85  Pulse: 73  Weight: 237 lb (107.5 kg)  Height: 5\' 5"  (1.651 m)  Body mass index is 39.44 kg/m.        Physical Examination:   General appearance - well appearing, and in no distress  Mental status - alert, oriented to person, place, and time  Psych:  She has a normal mood and affect  Skin - warm and dry, normal color, no suspicious lesions noted  Chest - effort normal, all lung fields clear to auscultation bilaterally  Heart - normal rate and regular rhythm  Neck:  midline trachea, no thyromegaly or nodules  Breasts - breasts appear normal, no suspicious masses, no skin or nipple changes or axillary nodes  Abdomen - soft, nontender, nondistended, no masses or organomegaly  Pelvic -  VULVA: normal appearing vulva with no masses, tenderness or lesions  VAGINA: normal appearing vagina with normal color and discharge, no lesions  CERVIX: normal appearing cervix without discharge or lesions, no CMT  Thin prep pap is done with HR HPV cotesting at patient requestion  UTERUS: uterus is felt to be normal size, shape, consistency and nontender   ADNEXA: No adnexal masses or tenderness noted.  Extremities:  No swelling or varicosities noted  Chaperone present for exam  Assessment & Plan:  1. Well woman exam with routine gynecological exam PAP done.  Mammogram reviewed.  No risk factors for bone density scan - will plan on this at age 38.   No orders of the defined types were placed in this encounter.   Meds: No orders of the defined types were placed in this encounter.   Follow-up: No follow-ups on file.  Levie Heritage, DO 10/01/2022 11:50 AM

## 2022-10-06 LAB — CYTOLOGY - PAP
Adequacy: ABSENT
Chlamydia: NEGATIVE
Comment: NEGATIVE
Comment: NORMAL
Diagnosis: NEGATIVE
Neisseria Gonorrhea: NEGATIVE

## 2023-06-09 ENCOUNTER — Other Ambulatory Visit: Payer: Self-pay | Admitting: Physician Assistant

## 2023-06-09 DIAGNOSIS — Z1231 Encounter for screening mammogram for malignant neoplasm of breast: Secondary | ICD-10-CM

## 2023-06-26 ENCOUNTER — Ambulatory Visit
Admission: RE | Admit: 2023-06-26 | Discharge: 2023-06-26 | Disposition: A | Discharge status: Home / Self Care | Source: Ambulatory Visit | Attending: Physician Assistant | Admitting: Physician Assistant

## 2023-06-26 DIAGNOSIS — Z1231 Encounter for screening mammogram for malignant neoplasm of breast: Secondary | ICD-10-CM

## 2023-06-30 NOTE — Progress Notes (Signed)
 Chief Complaint: Chief Complaint  Patient presents with  . Consult    Room 8 Left foot pain//osd 4 years//no recent xrays//[facility]       HPI  History of Present Illness The patient is a 63 year old female who presents to the clinic today for evaluation of left foot pain.  She underwent bunion surgery in 2018, with a 10% chance of recurrence as informed by her surgeon. Unfortunately, she fell within this 10% and has been experiencing significant issues since then. She reports severe pain in the back of her heel upon rising in the morning, to the point where she struggles to bear weight on it. She describes the sensation as if her muscles or nerves are being pulled. Occasionally, she experiences similar symptoms in her other foot. She also reports severe pain but does not experience any numbness or tingling in her feet.  She is diabetic with her most recent hemoglobin A1c of 5.7.  PAST SURGICAL HISTORY: Bunion surgery in 2018.  SOCIAL HISTORY Tobacco: She does not smoke.    ROS: Constitutional: Negative for activity change, appetite change, chills, fever and unexpected weight change.  HENT: Negative for nosebleeds, trouble swallowing and voice change.   Respiratory: Negative for chest tightness, shortness of breath, wheezing and stridor.   Cardiovascular: Negative for chest pain, palpitations and leg swelling.  Gastrointestinal: Negative for abdominal pain, blood in stool, constipation and diarrhea.  Genitourinary: Negative for flank pain,or  hematuria  VITALS There were no vitals filed for this visit.  Past Medical Hx: Medical History[1]  Current Meds: Current Medications[2]  Allergies: Allergies[3]  Exam: Vascular: Palpable dorsalis pedis pulse right. Palpable dorsalis pedis pulse left. Palpable posterior tibial pulse right . Palpable posterior tibial pulse left. No pallor on elevation or dependent rubor. Integument: Warm supple skin, positive hair growth on the  toes, no signs of ulceration or clubbing. Neurological: Intact sensation via 10 g monofilament to the level of the digits left. Musculoskeletal: Normal muscle mass and tone symmetric, bilateral.  Pain on palpation and range of motion of the first metatarsophalangeal joint left.  Increased lateral deviation noted to left great toe.  Pain on palpation along the course of Achilles tendon at its insertion to the posterior calcaneus left.  No palpable defect noted along the course of Achilles tendon left.  Decreased ankle joint dorsiflexion of the extended left.   Imaging: Radiographs 3 views left foot: No acute fracture noted.  Increased 1-2 intermetatarsal angle noted.  Increased hallux valgus angle noted.  Abnormal tibial sesamoid position noted.  Mild degenerative changes noted the first metatarsophalangeal joint left.  Retrocalcaneal exostosis noted.  I reviewed the patient's most recent hemoglobin A1c which was 5.7%. I reviewed the patient's vitamin D level which was 23.  Dx: 1. Hallux valgus, left  XR Foot Minimum 3 Views Left    2. Achilles tendinitis of left lower extremity  Ambulatory referral to Physical Therapy       Plan: A focused history and physical exam was performed today.  I discussed the findings with the patient.  Discussed with patient left foot hallux valgus deformity as well as insertional Achilles tendinitis in depth.  Recommend good supportive wide with shoe wear.  Recommend use of bunion offloading pad.  Recommend ice and oral anti-inflammatory medication as needed for pain.  Discussed with patient surgical treatment option including first metatarsophalangeal joint arthrodesis.  Patient will need to continue with vitamin D supplementation prior to surgery as patient has recent low vitamin D lab.  Also discussed with patient insertional Achilles tendinitis in depth.  Rx Medrol  Dosepak to be taken as directed. Referral to physical therapy for stretching, eccentric  strengthening and modalities for pain such as graston technique, iontophoresis or ultrasound.  Patient is return to clinic in 6 weeks for follow-up evaluation or sooner as needed.  Future Appt.: Scheduled Future Appointments       Provider Department Dept Phone Center   06/30/2023 8:30 AM Norleen Deward Le[place] [facility] [place]  - Podiatry [facility] [phone] [facility]   07/06/2023 10:40 AM Prentice Pac [facility]  - Neurology [place] [phone] [facility]   07/09/2023 10:15 AM Leeroy Rad [facility]  - [place] [facility] [phone]    07/14/2023 12:30 PM OAK HOLLOW [facility] VISUAL FIELD [facility]  - Ophthalmology [facility] [phone] [facility] 1565 NUD   07/14/2023 1:00 PM Comer Pillow [place] [facility]  - Ophthalmology [facility] [phone] [facility] 1565 NUD   07/20/2023 10:15 AM Leeroy Rad [facility]  - [place] [facility] [phone]    07/27/2023 10:00 AM Alana Search [facility]  - Pain Center [facility] [phone] [facility]   08/03/2023 10:15 AM Leeroy Rad [facility]  - [place] [facility] [phone]    08/18/2023 2:15 PM Leeroy Rad [facility]  - [place] [facility] [phone]    09/01/2023 2:15 PM Leeroy Rad [facility]  - [place] [facility] [phone]    05/23/2024 9:20 AM Pac Lamar Lee [facility] [phone] [facility] Norleen Deward [place], [place]   06/30/2023       [1] Past Medical History: Diagnosis Date  . Breast cancer    (CMD)   . Cancer    (CMD)   . Chest pain 11/22/2015   Last Assessment & Plan:  She also notes that she has been having episodes of squeezing chest tightness. She has had  3 of these episodes in the last 3 months. They usually occur when she is exerting herself and are associated with diaphoresis. The tightness is not associated with eating or rest. These episodes last 5 minutes and the tightness improves somewhat when she rests. She has never had an ep  . Chronic neck pain 05/29/2014  . De Quervain's tenosynovitis, right 10/09/2015  . Depression   . Depression, major, recurrent, moderate (CMD) 06/08/2014   Last Assessment & Plan:  Patient improves improvement in depression and anxiety symptoms. She did not fill the Buspirone  due to cost, but feels her anxiety has improved. GAD score has improved from 12 to 5 today (mild).   Plan: -Continue Prozac  40 mg daily -Could consider adding buspirone  7.5 BID or Trazodone in the future  Last Assessment & Plan:  She has not been able to refill prozac  due to cost  . Diabetes mellitus    (CMD)   . Diabetes mellitus type I    (CMD) 2020  . Encounter for cervical Pap smear with pelvic exam 05/09/2015   Last Assessment & Plan:  Patient comes in today for a  pap smear. Patient has a history of a supracervical hysterectomy in the year 2000. Previous abnormal Pap smears: no. Contraception: none. She denies any complaints of bloating, early satiety, vaginal discharge, itching, abnormal odor or vaginal bleeding.  Pelvic Exam: cervix normal in appearance, external genitalia normal and vagina normal wit  . Equinus deformity of foot, acquired 08/24/2013   Last Assessment & Plan:  Patient requesting referral back to Podiatry, Dr. Minus who previously managed her chronic foot conditions.  Plan: -Referral to Podiatry with Dr. Minus  . Essential hypertension 10/10/2014   Last Assessment & Plan:  Formatting of this note may be different from the original. BP Readings from Last 3 Encounters:  03/28/15 137/81  02/22/15 134/92  02/08/15 130/81   Lab Results  Component Value Date   NA 141 10/10/2014   K 4.7 10/10/2014   CREATININE 0.67 10/10/2014    Assessment: Blood pressure control:  Controlled Progress toward BP goal:   At goal Comments: Compliant with lisinopril -HCTZ  . Flat foot 09/17/2015   Last Assessment & Plan:  Patient reports this is a lifelong condition. Has wanted orthopedic inserts before however was unable to afford them. -Referred to podiatry for assistance with this and with plantar fasciitis.   . Fractures teenager  . GERD (gastroesophageal reflux disease)   . Healthcare maintenance 10/10/2014   Last Assessment & Plan:  Patient says she recently had Hep C and HIV checked at a health fair, said they were negative. Also recently did stool cards which were negative. Does have recent complaints of RLS, therefore iron deficiency anemia was suspected, however, Hb is low normal at 11.2. Ferritin 142.   Overview:  Mammogram N 10/2014 Colon Cancer Screening: FOBT negative 01/2015   Last Assessment &  . History of fall late 2023  . Hypercholesteremia 10/11/2014   Last Assessment & Plan:  Had not picked up pravastatin  40 mg daily.  Plan: -Sent pravastatin  40 mg daily to [facility]  . Hypercholesterolemia   . Hypertension   . Impaired mobility and activities of daily living 10/19/2019  . Joint pain early 2000's  . Knee pain 09/13/2014   Overview:  Last Assessment & Plan:  consistent with DJD.  Try diclofenac  twice a day with food.  Discussed tyelnol, glucosamine, topical medications.  Given information to get [facility] coverage and would consider radiographs.  She will consider injections.  Shown home exercises to do daily.  Heat/ice.  F/u in 1 month to 6 weeks.  . Lumbar radiculopathy, chronic 03/28/2015   Last Assessment & Plan:  For the past 6 months she has had lower back spasm which radiates to her right leg. The pain is worse with climbing up stairs and sitting for extended periods. She has chronic mild urge incontinence but denies sadle anesthesia. She has a 30 year history of knee osteoarthritis and believes that the pain may be related to her  chronic knee pain. She has been evaluated by ortho  . Moderate recurrent major depression (CMD) 10/13/2019  . Morton neuroma 02/08/2015   Last Assessment & Plan:  A/P: Morton neuroma. Patient has typical story and physical exam. Will try 1 month of conservative management.  Can consider steroid injections on follow up if not improved. - Wide toe-box shoes - Ibuprofen /Tylenol  PRN. - RTC 1 month  . Muscle spasm of calf 09/13/2015   Last Assessment & Plan:  Pt reports occasional muscle spasm of right calf. This could be secondary to patients altered gait in order to avoid walking on her heel. She finds relief with cyclobenzaprine  at home.  -Refill for cyclobenzaprine  given.  . Myopia of both eyes 11/29/2014   Last Assessment & Plan:  History of myopia, now worsening. Had received a new rx  for glasses one year, but did not get it filled. Requesting referral back to ophthalmology.  Plan: -referral back to ophthalmology  . Osteoarthritis of right knee 01/03/2015   Last Assessment & Plan:  consistent with moderate-severe DJD confirmed by MRI.  She does have a small lateral meniscus tear but given degree of her arthritis her pain is more likely due to the DJD.  Cortisone and supartz series without benefit unfortunately though feeling well today.  Dr. Jerri discussed replacement surgery with her - patient asked to lose 15 pounds before considering surgical interve  . Pain of lower extremity 08/24/2013  . Plantar fasciitis of right foot 09/13/2015   Last Assessment & Plan:  Patients history and physical exam consistent with plantar fasciitis. Information given on exercises to help with this condition. She has also been referred to Podiatry to help in management and also for evaluation of her pes planus.  -Ibuprofen  800 mg sent to pharmacy -Plantar fasciitis physical therapy instructions given to patient -Referred to podiatry  . Prediabetes 10/10/2014   Last Assessment & Plan:  Formatting of this note may be different  from the original. Lab Results  Component Value Date   HGBA1C 6.4 01/17/2015   HGBA1C 6.4 10/10/2014    Assessment: Diabetes control: At goal Progress toward A1C goal:   Stable Comments: Diet controlled  Plan: Medications:  none Instruction/counseling given: discussed the need for weight loss, discussed diet and provided printed educ  . Prediabetes 10/10/2014   Last Assessment & Plan:  Formatting of this note may be different from the original. Lab Results  Component Value Date   HGBA1C 6.4 01/17/2015   HGBA1C 6.4 10/10/2014    Assessment: Diabetes control: At goal Progress toward A1C goal:   Stable Comments: Diet controlled  Plan: Medications:  none Instruction/counseling given: discussed the need for weight loss, discussed diet and provided printed edu  . Restless leg syndrome 06/06/2015   Last Assessment & Plan:  Patient has recent complaints of RLS. Hb 11.2, ferritin normal (142).  -Try Neurontin  300 mg qhs. This may also help somewhat with the neuropathic pain she is experiencing from her wrist as well.   . Right carpal tunnel syndrome 10/09/2015   Last Assessment & Plan:  Patient continues to have pain 2/2 carpal tunnel.  Recent NCS-EMG showed mod-severe carpal tunnel.  She has tried PT and is waiting for orthopedic availability for surgical correction.  She is happy to stay with the [facility], but can also go to [facility].  A/P: Mod-Severe right carpal tunnel.  Stable. Patient needs surgical correction. Referral has already been placed,  . Sacroiliac joint dysfunction 09/14/2014  . Type 2 diabetes mellitus without complication, without long-term current use of insulin    (CMD)   . Varicella 8029   63 years old  [2]  Current Outpatient Medications:  .  amoxicillin (AMOXIL) 500 mg tablet, Take 4 tablets (2,000 mg), by mouth, 1 hour prior to your dental cleaning or dental procedure, Disp: 8 tablet, Rfl: 3 .  atorvastatin (LIPITOR) 80 mg tablet, Take 1 tablet (80 mg total) by mouth at bedtime.,  Disp: 90 tablet, Rfl: 3 .  carboxymethylcell-glycerin,PF, (Refresh Relieva PF) 0.5-0.9 % drop, Administer 1 drop into each eyes daily., Disp: , Rfl:  .  celecoxib (CeleBREX) 200 mg capsule, Take 1 capsule (200 mg total) by mouth 2 (two) times a day. *SCHEDULE AN APPOINTMENT FOR ADDITIONAL REFILLS*, Disp: 60 capsule, Rfl: 0 .  clonazePAM (KlonoPIN) 0.5 mg tablet, TAKE  1 TABLET(0.5 MG) BY MOUTH AT BEDTIME, Disp: 90 tablet, Rfl: 0 .  ergocalciferol (VITAMIN D2) 1,250 mcg (50,000 unit) capsule, Take 1 capsule (50,000 Units total) by mouth once a week., Disp: 12 capsule, Rfl: 3 .  escitalopram  (LEXAPRO ) 10 mg tablet, Take 1 tablet (10 mg total) by mouth daily., Disp: 90 tablet, Rfl: 1 .  fenofibrate nanocrystallized (TRICOR) 48 mg tablet, Take 1 tablet (48 mg total) by mouth daily., Disp: 90 tablet, Rfl: 1 .  ferrous sulfate 325 mg (65 mg iron) EC tablet, Take 1 tablet (325 mg total) by mouth daily with breakfast., Disp: 90 tablet, Rfl: 1 .  gabapentin  (NEURONTIN ) 400 mg capsule, TAKE 3 CAPSULES(1200 MG) BY MOUTH EVERY NIGHT, Disp: 270 capsule, Rfl: 1 .  glucose blood (Accu-Chek Guide test strips) test strip, , Disp: 100 strip, Rfl: 0 .  glucose monitoring kit kit, , Disp: 1 each, Rfl: 0 .  Lancets misc, , Disp: 100 each, Rfl: 11 .  latanoprost (XALATAN) 0.005 % ophthalmic solution, Administer 1 drop into the right eye at bedtime., Disp: 2.5 mL, Rfl: 6 .  losartan -hydroCHLOROthiazide  (HYZAAR) 50-12.5 mg per tablet, Take 1 tablet by mouth daily., Disp: 90 tablet, Rfl: 1 .  multivit-minerals/folic acid  (ONE-A-DAY WOMEN'S 50 PLUS ORAL), Take 1 tablet by mouth daily., Disp: , Rfl:  .  nitroglycerin  (NITROSTAT ) 0.4 mg SL tablet, Place 0.4 mg under the tongue every 5 (five) minutes as needed for chest pain., Disp: 25 tablet, Rfl: 2 .  pantoprazole  (PROTONIX ) 40 mg EC tablet, Take 1 tablet (40 mg total) by mouth daily., Disp: 90 tablet, Rfl: 1 .  semaglutide (Ozempic) 0.25 mg or 0.5 mg (2 mg/3 mL) pen injector,  Inject 0.25 mg under the skin once a week., Disp: 9 mL, Rfl: 1 .  UNABLE TO FIND, Power C gummy- chew and swallow 1 each by mouth daily, Disp: , Rfl:  [3] Allergies Allergen Reactions  . Imiquimod  Anaphylaxis  . Latex Itching and Swelling    redness  . Pineapple Anaphylaxis  . Tramadol Dizziness  . Grape Rash

## 2023-12-03 ENCOUNTER — Ambulatory Visit (INDEPENDENT_AMBULATORY_CARE_PROVIDER_SITE_OTHER): Admitting: Family Medicine

## 2023-12-03 VITALS — BP 127/86 | HR 76 | Ht 66.0 in | Wt 245.0 lb

## 2023-12-03 DIAGNOSIS — Z01419 Encounter for gynecological examination (general) (routine) without abnormal findings: Secondary | ICD-10-CM

## 2023-12-03 DIAGNOSIS — Z202 Contact with and (suspected) exposure to infections with a predominantly sexual mode of transmission: Secondary | ICD-10-CM | POA: Diagnosis not present

## 2023-12-03 DIAGNOSIS — Z1231 Encounter for screening mammogram for malignant neoplasm of breast: Secondary | ICD-10-CM

## 2023-12-03 NOTE — Progress Notes (Signed)
 ANNUAL EXAM Patient name: Maria Sims MRN [id number]  Date of birth: 04-Dec-1960 Chief Complaint:   Annual Exam  History of Present Illness:   Maria Sims is a 63 y.o.  G4P3  female  being seen today for a routine annual exam.  Current complaints: none. Had recent bunion surgery. Recent knee replacement earlier in the year  No LMP recorded (lmp unknown). Patient has had a hysterectomy.    Last pap 2024. Results were: NILM w/ HRHPV negative. H/O abnormal pap: yes ASCUS + HPV 2019 Last mammogram: 06/2023. Results were: normal. Personal h/o breast cancer: yes   Last colonoscopy: 2019. Results were: normal. Family h/o colorectal cancer: no     12/03/2023   11:15 AM 10/01/2022   10:37 AM 10/14/2017    2:04 PM 07/15/2017    1:41 PM 04/15/2017    2:30 PM  Depression screen PHQ 2/9  Decreased Interest 0 1 1 0 0  Down, Depressed, Hopeless 1 1 1  0 0  PHQ - 2 Score 1 2 2  0 0  Altered sleeping 1 0 2    Tired, decreased energy 0 1 1    Change in appetite 1 0 1    Feeling bad or failure about yourself  0 1 0    Trouble concentrating 0 0 0    Moving slowly or fidgety/restless 0 0 1    Suicidal thoughts 0 0 0    PHQ-9 Score 3 4  7      Difficult doing work/chores   Not difficult at all       Data saved with a previous flowsheet row definition        12/03/2023   11:15 AM 10/01/2022   10:37 AM 10/14/2017    2:52 PM 03/28/2015    2:24 PM  GAD 7 : Generalized Anxiety Score  Nervous, Anxious, on Edge 0 0 1 2  Control/stop worrying 0 1 2 1   Worry too much - different things 0 1 1 0  Trouble relaxing 0 1 3 1   Restless 2 0 0 0  Easily annoyed or irritable 0 1 1 1   Afraid - awful might happen 0 0 0 0  Total GAD 7 Score 2 4 8 5      Review of Systems:   Pertinent items are noted in HPI Denies any headaches, blurred vision, fatigue, shortness of breath, chest pain, abdominal pain, abnormal vaginal discharge/itching/odor/irritation, problems with periods, bowel movements,  urination, or intercourse unless otherwise stated above. Pertinent History Reviewed:  Reviewed past medical,surgical, social and family history.  Reviewed problem list, medications and allergies. Physical Assessment:   Vitals:   12/03/23 1105  BP: 127/86  Pulse: 76  Weight: 245 lb 0.6 oz (111.1 kg)  Height: 5' 6 (1.676 m)  Body mass index is 39.55 kg/m.        Physical Examination:   General appearance - well appearing, and in no distress  Mental status - alert, oriented to person, place, and time  Psych:  She has a normal mood and affect  Skin - warm and dry, normal color, no suspicious lesions noted  Chest - effort normal, all lung fields clear to auscultation bilaterally  Heart - normal rate and regular rhythm  Neck:  midline trachea, no thyromegaly or nodules  Breasts - breasts appear normal, no suspicious masses, no skin or nipple changes or axillary nodes  Abdomen - soft, nontender, nondistended, no masses or organomegaly  Pelvic - VULVA: atrophic appearing vulva with  no masses, tenderness or lesions  VAGINA: atrophic appearing vagina with normal color and discharge, no lesions  CERVIX: normal appearing cervix without discharge or lesions, no CMT  UTERUS: surgically absent  Extremities:  No swelling or varicosities noted  Chaperone present for exam  Assessment & Plan:  1. Screening mammogram for breast cancer (Primary) PAP not due - MM 3D SCREENING MAMMOGRAM BILATERAL BREAST; Future  2. Encounter for well woman exam with routine gynecological exam   3. Exposure to sexually transmitted infection - Cervicovaginal ancillary only( [facility])   Labs/procedures today:    Orders Placed This Encounter  Procedures   MM 3D SCREENING MAMMOGRAM BILATERAL BREAST    Meds: No orders of the defined types were placed in this encounter.   Follow-up: No follow-ups on file.  Bakary Bramer J Veer Elamin, DO 12/03/2023 12:24 PM

## 2023-12-03 NOTE — Progress Notes (Signed)
 Patient presents for Annual.  LMP: No LMP recorded (lmp unknown). Patient has had a hysterectomy.  Last pap: Date: 10/01/2022 Contraception: None Mammogram: Up to date: 06/30/2023  STD Screening: Accepts Flu Vaccine : Declines  CC:   Fun Fact: breast cancer survivor of 39 years    Psq9 and gad 7 completed
